# Patient Record
Sex: Male | Born: 1985 | Race: White | Hispanic: No | Marital: Single | Smoking: Current every day smoker
Health system: Southern US, Community
[De-identification: ages and names within clinical notes are randomized; demographics above are authoritative.]

## PROBLEM LIST (undated history)

## (undated) DIAGNOSIS — J020 Streptococcal pharyngitis: Secondary | ICD-10-CM

## (undated) DIAGNOSIS — G8929 Other chronic pain: Secondary | ICD-10-CM

## (undated) DIAGNOSIS — N200 Calculus of kidney: Secondary | ICD-10-CM

## (undated) DIAGNOSIS — R109 Unspecified abdominal pain: Secondary | ICD-10-CM

## (undated) HISTORY — PX: LITHOTRIPSY: SUR834

---
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
## 2008-10-26 ENCOUNTER — Emergency Department (HOSPITAL_COMMUNITY): Admission: EM | Admit: 2008-10-26 | Discharge: 2008-10-26 | Payer: Self-pay | Admitting: Emergency Medicine

## 2009-06-15 ENCOUNTER — Emergency Department (HOSPITAL_COMMUNITY)
Admission: EM | Admit: 2009-06-15 | Discharge: 2009-06-15 | Payer: Self-pay | Source: Home / Self Care | Admitting: Emergency Medicine

## 2009-07-17 ENCOUNTER — Emergency Department (HOSPITAL_COMMUNITY): Admission: EM | Admit: 2009-07-17 | Discharge: 2009-07-17 | Payer: Self-pay | Admitting: Emergency Medicine

## 2009-09-05 ENCOUNTER — Emergency Department (HOSPITAL_COMMUNITY): Admission: EM | Admit: 2009-09-05 | Discharge: 2009-09-05 | Payer: Self-pay | Admitting: Emergency Medicine

## 2009-09-15 ENCOUNTER — Emergency Department (HOSPITAL_COMMUNITY)
Admission: EM | Admit: 2009-09-15 | Discharge: 2009-09-15 | Payer: Self-pay | Source: Home / Self Care | Admitting: Emergency Medicine

## 2009-09-18 ENCOUNTER — Emergency Department (HOSPITAL_COMMUNITY): Admission: EM | Admit: 2009-09-18 | Discharge: 2009-09-18 | Payer: Self-pay | Admitting: Emergency Medicine

## 2010-04-25 LAB — URINALYSIS, ROUTINE W REFLEX MICROSCOPIC
Glucose, UA: NEGATIVE mg/dL
Nitrite: NEGATIVE
pH: 6 (ref 5.0–8.0)

## 2010-04-25 LAB — URINE MICROSCOPIC-ADD ON

## 2010-04-26 LAB — URINE MICROSCOPIC-ADD ON

## 2010-04-26 LAB — URINALYSIS, ROUTINE W REFLEX MICROSCOPIC: Glucose, UA: NEGATIVE mg/dL

## 2010-04-29 LAB — URINALYSIS, ROUTINE W REFLEX MICROSCOPIC
Bilirubin Urine: NEGATIVE
Leukocytes, UA: NEGATIVE
Specific Gravity, Urine: >1.03 — ABNORMAL HIGH (ref 1.005–1.030)
Urobilinogen, UA: 0.2 mg/dL (ref 0.0–1.0)
pH: 6 (ref 5.0–8.0)

## 2010-04-29 LAB — URINE MICROSCOPIC-ADD ON

## 2010-05-26 ENCOUNTER — Emergency Department (HOSPITAL_COMMUNITY)
Admission: EM | Admit: 2010-05-26 | Discharge: 2010-05-27 | Disposition: A | Discharge status: Home / Self Care | Payer: 59 | Attending: Emergency Medicine | Admitting: Emergency Medicine

## 2010-05-26 DIAGNOSIS — R109 Unspecified abdominal pain: Secondary | ICD-10-CM | POA: Insufficient documentation

## 2010-05-26 DIAGNOSIS — R319 Hematuria, unspecified: Secondary | ICD-10-CM | POA: Insufficient documentation

## 2010-05-26 DIAGNOSIS — F17200 Nicotine dependence, unspecified, uncomplicated: Secondary | ICD-10-CM | POA: Insufficient documentation

## 2010-05-26 DIAGNOSIS — N23 Unspecified renal colic: Secondary | ICD-10-CM | POA: Insufficient documentation

## 2010-05-26 LAB — URINALYSIS, ROUTINE W REFLEX MICROSCOPIC
Bilirubin Urine: NEGATIVE
Glucose, UA: NEGATIVE mg/dL
Ketones, ur: NEGATIVE mg/dL
Leukocytes, UA: NEGATIVE
Nitrite: NEGATIVE
Protein, ur: NEGATIVE mg/dL
Specific Gravity, Urine: >1.03 — ABNORMAL HIGH (ref 1.005–1.030)
Urobilinogen, UA: 0.2 mg/dL (ref 0.0–1.0)
pH: 6 (ref 5.0–8.0)

## 2010-05-26 LAB — URINE MICROSCOPIC-ADD ON

## 2010-07-05 ENCOUNTER — Emergency Department (HOSPITAL_COMMUNITY)
Admission: EM | Admit: 2010-07-05 | Discharge: 2010-07-05 | Disposition: A | Discharge status: Home / Self Care | Payer: 59 | Attending: Emergency Medicine | Admitting: Emergency Medicine

## 2010-07-05 DIAGNOSIS — N201 Calculus of ureter: Secondary | ICD-10-CM | POA: Insufficient documentation

## 2010-07-05 DIAGNOSIS — F17200 Nicotine dependence, unspecified, uncomplicated: Secondary | ICD-10-CM | POA: Insufficient documentation

## 2010-07-05 DIAGNOSIS — L408 Other psoriasis: Secondary | ICD-10-CM | POA: Insufficient documentation

## 2010-07-05 LAB — URINE MICROSCOPIC-ADD ON

## 2010-07-05 LAB — URINALYSIS, ROUTINE W REFLEX MICROSCOPIC
Protein, ur: 100 mg/dL — AB
Specific Gravity, Urine: >1.03 — ABNORMAL HIGH (ref 1.005–1.030)

## 2011-05-25 ENCOUNTER — Emergency Department (HOSPITAL_COMMUNITY): Payer: Self-pay

## 2011-05-25 ENCOUNTER — Emergency Department (HOSPITAL_COMMUNITY)
Admission: EM | Admit: 2011-05-25 | Discharge: 2011-05-25 | Disposition: A | Discharge status: Home / Self Care | Payer: Self-pay | Attending: Emergency Medicine | Admitting: Emergency Medicine

## 2011-05-25 ENCOUNTER — Encounter (HOSPITAL_COMMUNITY): Payer: Self-pay | Admitting: *Deleted

## 2011-05-25 DIAGNOSIS — F17200 Nicotine dependence, unspecified, uncomplicated: Secondary | ICD-10-CM | POA: Insufficient documentation

## 2011-05-25 DIAGNOSIS — R109 Unspecified abdominal pain: Secondary | ICD-10-CM | POA: Insufficient documentation

## 2011-05-25 DIAGNOSIS — Z87442 Personal history of urinary calculi: Secondary | ICD-10-CM | POA: Insufficient documentation

## 2011-05-25 LAB — URINALYSIS, ROUTINE W REFLEX MICROSCOPIC
Bilirubin Urine: NEGATIVE
Hgb urine dipstick: NEGATIVE
Specific Gravity, Urine: 1.025 (ref 1.005–1.030)

## 2011-05-25 MED ORDER — KETOROLAC TROMETHAMINE 60 MG/2ML IM SOLN
60.0000 mg | Freq: Once | INTRAMUSCULAR | Status: AC
  Administered 2011-05-25: 60 mg via INTRAMUSCULAR
  Filled 2011-05-25: qty 2

## 2011-05-25 NOTE — ED Notes (Signed)
Patient states he thinks he's passing the stone and requests not to have xray completed and to go home.

## 2011-05-25 NOTE — ED Notes (Signed)
Vomiting, diarrhea, pain rt lower back,  Hx of kidney stones.

## 2011-05-25 NOTE — ED Provider Notes (Signed)
History    This chart was scribed for Dale Quarry, MD, MD by Smitty Pluck. The patient was seen in room APA03 and the patient's care was started at 7:27PM.   CSN: [id number]  Arrival date & time 05/25/11  1614   First MD Initiated Contact with Patient 05/25/11 1914      Chief Complaint  Patient presents with  . Flank Pain    (Consider location/radiation/quality/duration/timing/severity/associated sxs/prior treatment) Patient is a 26 y.o. male presenting with flank pain. The history is provided by the patient.  Flank Pain   Dale Martinez is a 26 y.o. male who presents to the Emergency Department complaining of moderate right side flank pain onset today. Pt reports that the pain has improved. Describes the pain as a "soreness." The pain felt like previous kidney stones. He states that pain was 6/10 at worse and now at 2/10. Pt reports having nausea today and vomiting. Pt is a smoker. Denies drinking alcohol. He reports that he has had 10-11 kidney stones. There is no radiation of pain.   Past Medical History  Diagnosis Date  . Renal disorder   . Kidney stones     History reviewed. No pertinent past surgical history.  Family History  Problem Relation Age of Onset  . Diabetes Father     History  Substance Use Topics  . Smoking status: Current Everyday Smoker  . Smokeless tobacco: Not on file  . Alcohol Use: No      Review of Systems  Genitourinary: Positive for flank pain.  All other systems reviewed and are negative.   10 Systems reviewed and all are negative for acute change except as noted in the HPI.   Allergies  Review of patient's allergies indicates no known allergies.  Home Medications  No current outpatient prescriptions on file.  BP 143/74  Pulse 112  Temp(Src) 98 F (36.7 C) (Oral)  Resp 20  Ht 5\' 11"  (1.803 m)  Wt 215 lb (97.523 kg)  BMI 29.99 kg/m2  SpO2 98%  Physical Exam  Nursing note and vitals reviewed. Constitutional: He is  oriented to person, place, and time. He appears well-developed and well-nourished. No distress.  HENT:  Head: Normocephalic and atraumatic.  Eyes: Conjunctivae are normal. Pupils are equal, round, and reactive to light.  Neck: Normal range of motion. Neck supple.  Cardiovascular: Normal rate, regular rhythm and normal heart sounds.   Pulmonary/Chest: Effort normal and breath sounds normal. No respiratory distress.  Abdominal: Soft. He exhibits no distension.  Neurological: He is alert and oriented to person, place, and time.  Skin: Skin is warm and dry.  Psychiatric: He has a normal mood and affect. His behavior is normal.    ED Course  Procedures (including critical care time) DIAGNOSTIC STUDIES: Oxygen Saturation is 98% on room air, normal by my interpretation.    COORDINATION OF CARE: 7:30PM EDP discusses pt ED treatment course with pt.  7:45PM EDP ordered medication: Toradol 60 mg   Labs Reviewed  URINALYSIS, ROUTINE W REFLEX MICROSCOPIC - Abnormal; Notable for the following:    Ketones, ur TRACE (*)    All other components within normal limits   No results found.   No diagnosis found.   Results for orders placed during the hospital encounter of 05/25/11  URINALYSIS, ROUTINE W REFLEX MICROSCOPIC      Component Value Range   Color, Urine YELLOW  YELLOW    APPearance CLEAR  CLEAR    Specific Gravity, Urine 1.025  1.005 - 1.030    pH 6.0  5.0 - 8.0    Glucose, UA NEGATIVE  NEGATIVE (mg/dL)   Hgb urine dipstick NEGATIVE  NEGATIVE    Bilirubin Urine NEGATIVE  NEGATIVE    Ketones, ur TRACE (*) NEGATIVE (mg/dL)   Protein, ur NEGATIVE  NEGATIVE (mg/dL)   Urobilinogen, UA 0.2  0.0 - 1.0 (mg/dL)   Nitrite NEGATIVE  NEGATIVE    Leukocytes, UA NEGATIVE  NEGATIVE     MDM  Patient states he does not want to have KUB. He feels like he is currently passing a kidney stone. He has not had any white blood cells 4 hemoglobin his urine. He is discharged to home to followup as  needed.      Dale Quarry, MD 05/25/11 2115

## 2011-05-25 NOTE — Discharge Instructions (Signed)
Flank Pain Flank pain refers to pain that is located on the side of the body between the upper abdomen and the back. It can be caused by many things. CAUSES  Some of the more common causes of flank pain include:  Muscle strain.   Muscle spasms.   A disease of your spine (vertebral disk disease).   A lung infection (pneumonia).   Fluid around your lungs (pulmonary edema).   A kidney infection.   Kidney stones.   A very painful skin rash on only one side of your body (shingles).   Gallbladder disease.  DIAGNOSIS  Blood tests, urine tests, and X-rays may help your caregiver determine what is wrong. TREATMENT  The treatment of pain depends on the cause. Your caregiver will determine what treatment will work best for you. HOME CARE INSTRUCTIONS   Home care will depend on the cause of your pain.   Some medications may help relieve the pain. Take medication for relief of pain as directed by your caregiver.   Tell your caregiver about any changes in your pain.   Follow up with your caregiver.  SEEK IMMEDIATE MEDICAL CARE IF:   Your pain is not controlled with medication.   The pain increases.   You have abdominal pain.   You have shortness of breath.   You have persistent nausea or vomiting.   You have swelling in your abdomen.   You feel faint or pass out.   You have a temperature by mouth above 102 F (38.9 C), not controlled by medicine.  MAKE SURE YOU:   Understand these instructions.   Will watch your condition.   Will get help right away if you are not doing well or get worse.  Document Released: 03/19/2005 Document Revised: 01/15/2011 Document Reviewed: 07/13/2009 [employer] Patient Information 2012 [employer]. 

## 2011-08-01 ENCOUNTER — Encounter (HOSPITAL_COMMUNITY): Payer: Self-pay

## 2011-08-01 ENCOUNTER — Emergency Department (HOSPITAL_COMMUNITY)
Admission: EM | Admit: 2011-08-01 | Discharge: 2011-08-01 | Disposition: A | Discharge status: Home / Self Care | Payer: BC Managed Care – PPO | Attending: Emergency Medicine | Admitting: Emergency Medicine

## 2011-08-01 DIAGNOSIS — J029 Acute pharyngitis, unspecified: Secondary | ICD-10-CM | POA: Insufficient documentation

## 2011-08-01 DIAGNOSIS — F17200 Nicotine dependence, unspecified, uncomplicated: Secondary | ICD-10-CM | POA: Insufficient documentation

## 2011-08-01 MED ORDER — PENICILLIN V POTASSIUM 250 MG PO TABS
500.0000 mg | ORAL_TABLET | Freq: Once | ORAL | Status: AC
  Administered 2011-08-01: 500 mg via ORAL
  Filled 2011-08-01: qty 2

## 2011-08-01 MED ORDER — PENICILLIN V POTASSIUM 500 MG PO TABS: 500.0000 mg | ORAL_TABLET | Freq: Four times a day (QID) | ORAL | Status: AC

## 2011-08-01 NOTE — ED Notes (Signed)
C/o sore throat since yesterday, tonsils are swollen and red, strep screen obtained and sent to lab

## 2011-08-01 NOTE — ED Provider Notes (Signed)
History     CSN: [id number]  Arrival date & time 08/01/11  1025   First MD Initiated Contact with Patient 08/01/11 1054      Chief Complaint  Patient presents with  . Sore Throat    (Consider location/radiation/quality/duration/timing/severity/associated sxs/prior treatment) Patient is a 26 y.o. male presenting with pharyngitis. The history is provided by the patient. No language interpreter was used.  Sore Throat This is a new problem. The current episode started yesterday. The problem occurs constantly. The problem has been gradually worsening. Associated symptoms include a fever, headaches and a sore throat. Pertinent negatives include no chills or rash. The symptoms are aggravated by swallowing. He has tried nothing for the symptoms.    Past Medical History  Diagnosis Date  . Renal disorder   . Kidney stones     History reviewed. No pertinent past surgical history.  Family History  Problem Relation Age of Onset  . Diabetes Father     History  Substance Use Topics  . Smoking status: Current Everyday Smoker  . Smokeless tobacco: Not on file  . Alcohol Use: No      Review of Systems  Constitutional: Positive for fever. Negative for chills.  HENT: Positive for sore throat and trouble swallowing.   Skin: Negative for rash.  Neurological: Positive for headaches.  All other systems reviewed and are negative.    Allergies  Review of patient's allergies indicates no known allergies.  Home Medications   Current Outpatient Rx  Name Route Sig Dispense Refill  . PENICILLIN V POTASSIUM 500 MG PO TABS Oral Take 1 tablet (500 mg total) by mouth 4 (four) times daily. 40 tablet 0    BP 148/83  Pulse 74  Temp 97.8 F (36.6 C) (Oral)  Resp 20  Ht 5\' 8"  (1.727 m)  Wt 220 lb (99.791 kg)  BMI 33.45 kg/m2  SpO2 98%  Physical Exam  Nursing note and vitals reviewed. Constitutional: He is oriented to person, place, and time. He appears well-developed and  well-nourished.  HENT:  Head: Normocephalic and atraumatic.  Right Ear: External ear normal.  Left Ear: External ear normal.  Mouth/Throat: Uvula is midline and mucous membranes are normal. No uvula swelling. Oropharyngeal exudate and posterior oropharyngeal erythema present. No posterior oropharyngeal edema or tonsillar abscesses.  Eyes: EOM are normal.  Neck: Trachea normal and normal range of motion.  Cardiovascular: Normal rate, regular rhythm, normal heart sounds and intact distal pulses.   Pulmonary/Chest: Effort normal and breath sounds normal. No respiratory distress.  Abdominal: Soft. He exhibits no distension. There is no tenderness.  Musculoskeletal: Normal range of motion.  Lymphadenopathy:    He has cervical adenopathy.       Right cervical: Deep cervical adenopathy present.       Left cervical: Deep cervical adenopathy present.  Neurological: He is alert and oriented to person, place, and time.  Skin: Skin is warm and dry.  Psychiatric: He has a normal mood and affect. Judgment normal.    ED Course  Procedures (including critical care time)   Labs Reviewed  RAPID STREP SCREEN   No results found.   1. Pharyngitis       MDM  rx pen VK 500 mg, [address] [place], [place] 08/01/11 1205

## 2011-08-01 NOTE — Discharge Instructions (Signed)
Pharyngitis, Viral and Bacterial Pharyngitis is soreness (inflammation) or infection of the pharynx. It is also called a sore throat. CAUSES  Most sore throats are caused by viruses and are part of a cold. However, some sore throats are caused by strep and other bacteria. Sore throats can also be caused by post nasal drip from draining sinuses, allergies and sometimes from sleeping with an open mouth. Infectious sore throats can be spread from person to person by coughing, sneezing and sharing cups or eating utensils. TREATMENT  Sore throats that are viral usually last 3-4 days. Viral illness will get better without medications (antibiotics). Strep throat and other bacterial infections will usually begin to get better about 24-48 hours after you begin to take antibiotics. HOME CARE INSTRUCTIONS   If the caregiver feels there is a bacterial infection or if there is a positive strep test, they will prescribe an antibiotic. The full course of antibiotics must be taken. If the full course of antibiotic is not taken, you or your child may become ill again. If you or your child has strep throat and do not finish all of the medication, serious heart or kidney diseases may develop.   Drink enough water and fluids to keep your urine clear or pale yellow.   Only take over-the-counter or prescription medicines for pain, discomfort or fever as directed by your caregiver.   Get lots of rest.   Gargle with salt water ( tsp. of salt in a glass of water) as often as every 1-2 hours as you need for comfort.   Hard candies may soothe the throat if individual is not at risk for choking. Throat sprays or lozenges may also be used.  SEEK MEDICAL CARE IF:   Large, tender lumps in the neck develop.   A rash develops.   Green, yellow-brown or bloody sputum is coughed up.   Your baby is older than 3 months with a rectal temperature of 100.5 F (38.1 C) or higher for more than 1 day.  SEEK IMMEDIATE MEDICAL CARE  IF:   A stiff neck develops.   You or your child are drooling or unable to swallow liquids.   You or your child are vomiting, unable to keep medications or liquids down.   You or your child has severe pain, unrelieved with recommended medications.   You or your child are having difficulty breathing (not due to stuffy nose).   You or your child are unable to fully open your mouth.   You or your child develop redness, swelling, or severe pain anywhere on the neck.   You have a fever.   Your baby is older than 3 months with a rectal temperature of 102 F (38.9 C) or higher.   Your baby is 61 months old or younger with a rectal temperature of 100.4 F (38 C) or higher.  MAKE SURE YOU:   Understand these instructions.   Will watch your condition.   Will get help right away if you are not doing well or get worse.  Document Released: 01/26/2005 Document Revised: 01/15/2011 Document Reviewed: 04/25/2007 [facility] Patient Information 2012 [place], [place].Salt Water Gargle This solution will help make your mouth and throat feel better. HOME CARE INSTRUCTIONS   Mix 1 teaspoon of salt in 8 ounces of warm water.   Gargle with this solution as much or often as you need or as directed. Swish and gargle gently if you have any sores or wounds in your mouth.   Do not  swallow this mixture.  Document Released: 10/31/2003 Document Revised: 01/15/2011 Document Reviewed: 03/23/2008 [facility] Patient Information 2012 [place], [place].   Take the antibiotic as directed.  Take ibuprofen 800 mg every 8 hrs with food.  Gargle frequently with salt water.  Chloraseptic will help also.

## 2011-08-01 NOTE — ED Notes (Signed)
Complain of sore throat since yesterday.

## 2011-08-04 NOTE — ED Provider Notes (Signed)
Medical screening examination/treatment/procedure(s) were performed by non-physician practitioner and as supervising physician I was immediately available for consultation/collaboration.   Dale Martinez W. Konstantinos Cordoba, MD 08/04/11 1058 

## 2011-09-29 ENCOUNTER — Encounter (HOSPITAL_COMMUNITY): Payer: Self-pay | Admitting: Emergency Medicine

## 2011-09-29 ENCOUNTER — Emergency Department (HOSPITAL_COMMUNITY)
Admission: EM | Admit: 2011-09-29 | Discharge: 2011-09-29 | Disposition: A | Discharge status: Home / Self Care | Payer: Self-pay | Attending: Emergency Medicine | Admitting: Emergency Medicine

## 2011-09-29 ENCOUNTER — Emergency Department (HOSPITAL_COMMUNITY): Payer: Self-pay

## 2011-09-29 DIAGNOSIS — R109 Unspecified abdominal pain: Secondary | ICD-10-CM | POA: Insufficient documentation

## 2011-09-29 DIAGNOSIS — N201 Calculus of ureter: Secondary | ICD-10-CM | POA: Insufficient documentation

## 2011-09-29 LAB — CBC WITH DIFFERENTIAL/PLATELET
Basophils Absolute: 0.1 10*3/uL (ref 0.0–0.1)
Eosinophils Absolute: 0.4 10*3/uL (ref 0.0–0.7)
Eosinophils Relative: 4 % (ref 0–5)
Lymphs Abs: 2.7 10*3/uL (ref 0.7–4.0)
MCH: 30.6 pg (ref 26.0–34.0)
MCHC: 33.9 g/dL (ref 30.0–36.0)
MCV: 90.4 fL (ref 78.0–100.0)
Monocytes Absolute: 1.2 10*3/uL — ABNORMAL HIGH (ref 0.1–1.0)
Monocytes Relative: 10 % (ref 3–12)
Neutro Abs: 7.8 10*3/uL — ABNORMAL HIGH (ref 1.7–7.7)
Neutrophils Relative %: 64 % (ref 43–77)
RDW: 12.9 % (ref 11.5–15.5)
WBC: 12.2 10*3/uL — ABNORMAL HIGH (ref 4.0–10.5)

## 2011-09-29 LAB — BASIC METABOLIC PANEL
CO2: 24 mEq/L (ref 19–32)
Calcium: 9.5 mg/dL (ref 8.4–10.5)
Chloride: 105 mEq/L (ref 96–112)
Creatinine, Ser: 0.86 mg/dL (ref 0.50–1.35)
Glucose, Bld: 113 mg/dL — ABNORMAL HIGH (ref 70–99)
Sodium: 139 mEq/L (ref 135–145)

## 2011-09-29 LAB — URINALYSIS, ROUTINE W REFLEX MICROSCOPIC
Leukocytes, UA: NEGATIVE
Protein, ur: NEGATIVE mg/dL

## 2011-09-29 MED ORDER — IBUPROFEN 800 MG PO TABS: 800.0000 mg | ORAL_TABLET | Freq: Three times a day (TID) | ORAL | Status: AC

## 2011-09-29 MED ORDER — TAMSULOSIN HCL 0.4 MG PO CAPS: 0.4000 mg | ORAL_CAPSULE | Freq: Every day | ORAL | Status: DC

## 2011-09-29 MED ORDER — ONDANSETRON HCL 4 MG PO TABS: 4.0000 mg | ORAL_TABLET | Freq: Four times a day (QID) | ORAL | Status: AC

## 2011-09-29 MED ORDER — KETOROLAC TROMETHAMINE 30 MG/ML IJ SOLN
30.0000 mg | Freq: Once | INTRAMUSCULAR | Status: AC
  Administered 2011-09-29: 30 mg via INTRAVENOUS
  Filled 2011-09-29: qty 1

## 2011-09-29 MED ORDER — ONDANSETRON HCL 4 MG/2ML IJ SOLN
4.0000 mg | Freq: Once | INTRAMUSCULAR | Status: AC
  Administered 2011-09-29: 4 mg via INTRAVENOUS
  Filled 2011-09-29: qty 2

## 2011-09-29 MED ORDER — SODIUM CHLORIDE 0.9 % IV BOLUS (SEPSIS)
1000.0000 mL | Freq: Once | INTRAVENOUS | Status: AC
  Administered 2011-09-29: 1000 mL via INTRAVENOUS

## 2011-09-29 MED ORDER — OXYCODONE-ACETAMINOPHEN 5-325 MG PO TABS: 2.0000 | ORAL_TABLET | ORAL | Status: AC | PRN

## 2011-09-29 MED ORDER — MORPHINE SULFATE 4 MG/ML IJ SOLN
4.0000 mg | Freq: Once | INTRAMUSCULAR | Status: AC
  Administered 2011-09-29: 4 mg via INTRAVENOUS
  Filled 2011-09-29: qty 1

## 2011-09-29 NOTE — ED Notes (Signed)
Pt c/o left flank pain with nausea since yesterday. Pt states he has a hx of kidney stones.

## 2011-09-29 NOTE — ED Provider Notes (Signed)
History   This chart was scribed for Dale Octave, MD by Charolett Bumpers . The patient was seen in room APA03/APA03. Patient's care was started at 0705.    CSN: [id number]  Arrival date & time 09/29/11  3086   First MD Initiated Contact with Patient 09/29/11 [phone]      Chief Complaint  Patient presents with  . Flank Pain  . Nausea    (Consider location/radiation/quality/duration/timing/severity/associated sxs/prior treatment) HPI DALE RIBEIRO is a 26 y.o. male who has a h/o kidney stones presents to the Emergency Department complaining of constant, moderate left flank pain that radiates around to his lower abdomen since yesterday. Pt reports associated n/v/d this morning, and states that he has vomited 2-3 times. Pt also notes that he noticed some hematuria. Pt denies any fevers or dysuria. Pt states that he hasn't taken anything for his symptoms. Pt denies any radiation of pain into his lower extremities. Pt reports that his last kidney stone was 6 months ago and states that today's symptoms are similar. Pt denies any h/o abdominal surgeries or surgeries for kidney stones. Pt denies ever seeing a nephrologist but reports he has been told to consult one for his frequent kidney stones.   No PCP   Past Medical History  Diagnosis Date  . Renal disorder   . Kidney stones     History reviewed. No pertinent past surgical history.  Family History  Problem Relation Age of Onset  . Diabetes Father     History  Substance Use Topics  . Smoking status: Current Everyday Smoker    Types: Cigarettes  . Smokeless tobacco: Not on file  . Alcohol Use: No      Review of Systems A complete 10 system review of systems was obtained and all systems are negative except as noted in the HPI and PMH.   Allergies  Review of patient's allergies indicates no known allergies.  Home Medications  No current outpatient prescriptions on file.  BP 151/92  Pulse 86  Temp 97.4 F  (36.3 C) (Oral)  Resp 20  Ht 5\' 11"  (1.803 m)  Wt 230 lb (104.327 kg)  BMI 32.08 kg/m2  SpO2 99%  Physical Exam  Nursing note and vitals reviewed. Constitutional: He is oriented to person, place, and time. He appears well-developed and well-nourished. No distress.  HENT:  Head: Normocephalic and atraumatic.  Right Ear: External ear normal.  Left Ear: External ear normal.  Nose: Nose normal.  Mouth/Throat: Oropharynx is clear and moist.  Eyes: Conjunctivae and EOM are normal. Pupils are equal, round, and reactive to light.  Neck: Normal range of motion. Neck supple. No tracheal deviation present.  Cardiovascular: Normal rate, regular rhythm and normal heart sounds.   Pulmonary/Chest: Effort normal and breath sounds normal. No respiratory distress. He has no wheezes.  Abdominal: Soft. Bowel sounds are normal. He exhibits no distension. There is no tenderness.  Genitourinary: Testes normal.       Testicles normal bilaterally.   Musculoskeletal: Normal range of motion. He exhibits tenderness. He exhibits no edema.       No CVA tenderness. Lower left perispinal tenderness.   Neurological: He is alert and oriented to person, place, and time. No sensory deficit.  Skin: Skin is warm and dry.  Psychiatric: He has a normal mood and affect. His behavior is normal.    ED Course  Procedures (including critical care time)  DIAGNOSTIC STUDIES: Oxygen Saturation is 99% on room air, normal by my interpretation.  COORDINATION OF CARE:  07:12-Discussed planned course of treatment with the patient including IV fluids, pain medication, CT scan of abdomen, blood work and UA, who is agreeable at this time.   07:15-Medication Orders: Ketorolac (Toradol) 30 mg/mL injection 30 mg-once; Morphine 4 mg/mL injection 4 mg-once; Ondansetron (Zofran) injection 4 mg-once; Sodium chloride 0.9% bolus 1,000 mL-once  09:07-Recheck: Pt reports improvement with ED medications. Informed pt of imaging findings of  a kidney stone. Awaiting pt to urinate for UA.   10:05-Recheck: Informed pt of 4mm stone on left and lab results. Pt be d/c with pain and nausea medication and to f/u with Dr. Jerre Simon in nephrology. Pt is agreeable.   Labs Reviewed  URINALYSIS, ROUTINE W REFLEX MICROSCOPIC - Abnormal; Notable for the following:    Hgb urine dipstick LARGE (*)     All other components within normal limits  CBC WITH DIFFERENTIAL - Abnormal; Notable for the following:    WBC 12.2 (*)     Neutro Abs 7.8 (*)     Monocytes Absolute 1.2 (*)     All other components within normal limits  BASIC METABOLIC PANEL - Abnormal; Notable for the following:    Glucose, Bld 113 (*)     All other components within normal limits  URINE MICROSCOPIC-ADD ON   Ct Abdomen Pelvis Wo Contrast  09/29/2011  *RADIOLOGY REPORT*  Clinical Data:  Left-sided flank pain with hematuria for 1 day.  CT ABDOMEN AND PELVIS WITHOUT CONTRAST (CT UROGRAM)  Technique: Contiguous axial images of the abdomen and pelvis without oral or intravenous contrast were obtained.  Comparison: 09/15/2009.  Findings:  Exam is limited for evaluation of entities other than urinary tract calculi due to lack of oral or intravenous contrast.   Clear lung bases.  Normal heart size without pericardial or pleural effusion.  Mild hepatic steatosis with sparing adjacent the gallbladder. Hepatomegaly, 19.2 cm cranial caudal.  Normal spleen, stomach, pancreas, gallbladder, biliary tract, adrenal glands.  Punctate bilateral renal collecting system calculi.  Mild left-sided urinary tract obstruction secondary to a ureteral pelvic junction calculus which measures 4 mm on transverse image 44.  No right-sided ureteric calculi or urinary tract obstruction.  Stable mildly prominent retroperitoneal nodes, reactive.  Normal colon, appendix, and terminal ileum.  Stable mildly prominent ileocolic mesenteric nodes.  No pelvic sidewall adenopathy. Normal urinary bladder and prostate. No  significant free fluid.  An area of skin thickening about the left pelvic anterior wall on image 85 is similar and nonspecific. Bone islands within the femoral heads.  Disc bulges at L4-L5 and L5- S1.  IMPRESSION:  1.  Mild left-sided urinary tract obstruction secondary to 4 mm left ureteral pelvic junction calculus. 2.  Bilateral renal calculi. 3.  Hepatic steatosis and hepatomegaly. 4.  Focal skin thickening about the anterior pelvic wall.  Similar. Consider physical exam correlation.   Original Report Authenticated By: Consuello Bossier, M.D.      No diagnosis found.    MDM  Left flank pain with nausea since yesterday. History of kidney stones. Abdomen soft and nontender.  UA with blood, no infection.  4mm obstructing stone on L.   Results d/w patient.  Pain controlled. No vomiting in ED. Stable for outpatient followup.   I personally performed the services described in this documentation, which was scribed in my presence.  The recorded information has been reviewed and considered.      Dale Octave, MD 09/29/11 1024

## 2011-10-21 ENCOUNTER — Encounter (HOSPITAL_COMMUNITY): Payer: Self-pay

## 2011-10-21 ENCOUNTER — Emergency Department (HOSPITAL_COMMUNITY)
Admission: EM | Admit: 2011-10-21 | Discharge: 2011-10-21 | Disposition: A | Discharge status: Home / Self Care | Payer: Self-pay | Attending: Emergency Medicine | Admitting: Emergency Medicine

## 2011-10-21 ENCOUNTER — Emergency Department (HOSPITAL_COMMUNITY): Payer: Self-pay

## 2011-10-21 DIAGNOSIS — R109 Unspecified abdominal pain: Secondary | ICD-10-CM | POA: Insufficient documentation

## 2011-10-21 DIAGNOSIS — N200 Calculus of kidney: Secondary | ICD-10-CM | POA: Insufficient documentation

## 2011-10-21 DIAGNOSIS — F17200 Nicotine dependence, unspecified, uncomplicated: Secondary | ICD-10-CM | POA: Insufficient documentation

## 2011-10-21 LAB — URINALYSIS, ROUTINE W REFLEX MICROSCOPIC
Bilirubin Urine: NEGATIVE
Glucose, UA: NEGATIVE mg/dL
Hgb urine dipstick: NEGATIVE
Ketones, ur: NEGATIVE mg/dL
Protein, ur: NEGATIVE mg/dL
Urobilinogen, UA: 0.2 mg/dL (ref 0.0–1.0)

## 2011-10-21 MED ORDER — ONDANSETRON HCL 4 MG/2ML IJ SOLN
4.0000 mg | Freq: Once | INTRAMUSCULAR | Status: AC
  Administered 2011-10-21: 4 mg via INTRAVENOUS
  Filled 2011-10-21: qty 2

## 2011-10-21 MED ORDER — HYDROMORPHONE HCL PF 1 MG/ML IJ SOLN
0.5000 mg | Freq: Once | INTRAMUSCULAR | Status: AC
  Administered 2011-10-21: 0.5 mg via INTRAVENOUS
  Filled 2011-10-21: qty 1

## 2011-10-21 MED ORDER — SODIUM CHLORIDE 0.9 % IV BOLUS (SEPSIS)
500.0000 mL | Freq: Once | INTRAVENOUS | Status: AC
  Administered 2011-10-21: 1000 mL via INTRAVENOUS

## 2011-10-21 MED ORDER — TAMSULOSIN HCL 0.4 MG PO CAPS: 0.4000 mg | ORAL_CAPSULE | Freq: Every day | ORAL | Status: DC

## 2011-10-21 MED ORDER — KETOROLAC TROMETHAMINE 30 MG/ML IJ SOLN
30.0000 mg | Freq: Once | INTRAMUSCULAR | Status: AC
  Administered 2011-10-21: 30 mg via INTRAVENOUS
  Filled 2011-10-21: qty 1

## 2011-10-21 MED ORDER — OXYCODONE-ACETAMINOPHEN 5-325 MG PO TABS: 1.0000 | ORAL_TABLET | Freq: Four times a day (QID) | ORAL | Status: AC | PRN

## 2011-10-21 NOTE — ED Notes (Signed)
Patient states he does not need anything at this time. 

## 2011-10-21 NOTE — ED Notes (Signed)
Pt c/o flank pain and urethra pain, pt states that he was diagnosed with kidney stone a few weeks ago, felt like he was passing a stone and now it feels like the stone has become stuck in his urethra, pt able to urinate, c/o pain and pressure.

## 2011-10-21 NOTE — ED Provider Notes (Signed)
History  This chart was scribed for Dale Hutching, MD by Shari Heritage. The patient was seen in room APA18/APA18. Patient's care was started at [phone].     CSN: [id number]  Arrival date & time 10/21/11  1191   First MD Initiated Contact with Patient 10/21/11 [phone]      Chief Complaint  Patient presents with  . Nephrolithiasis    The history is provided by the patient and medical records. No language interpreter was used.    EFRAM ROATH is a 26 y.o. male who presents to the Emergency Department complaining of moderate, constant, left flank pain that radiates around to his penis onset several hours ago. Patient says that he is urinating normally. He denies hematuria or vomiting. Patient states that he thinks the kidney stone is now lodged in his urethra. Patient was seen here on 09/29/11 by Dr. Glynn Octave complaining of left flank pain and was diagnosed with a 4 mm obstructing stone in left ureter as seen on patient's abdominal CT scans. Patient was discharged in stable condition after treatment with pain medication, anti-emetics and IV fluids.  Patient states that he has had at least 10 kidney stones in the past. He has been referred to a local urologist, but hasn't set up an appointment. He is a current everyday smoker.     Past Medical History  Diagnosis Date  . Renal disorder   . Kidney stones     History reviewed. No pertinent past surgical history.  Family History  Problem Relation Age of Onset  . Diabetes Father     History  Substance Use Topics  . Smoking status: Current Every Day Smoker    Types: Cigarettes  . Smokeless tobacco: Not on file  . Alcohol Use: No      Review of Systems A complete 10 system review of systems was obtained and all systems are negative except as noted in the HPI and PMH.   Allergies  Review of patient's allergies indicates no known allergies.  Home Medications  No current outpatient prescriptions on file.  BP 143/81  Pulse 81   Temp 98.1 F (36.7 C) (Oral)  Resp 18  Ht 5\' 11"  (1.803 m)  Wt 215 lb (97.523 kg)  BMI 29.99 kg/m2  SpO2 97%  Physical Exam  Nursing note and vitals reviewed. Constitutional: He is oriented to person, place, and time. He appears well-developed and well-nourished.  HENT:  Head: Normocephalic and atraumatic.  Eyes: Conjunctivae normal and EOM are normal. Pupils are equal, round, and reactive to light.  Neck: Normal range of motion. Neck supple.  Cardiovascular: Normal rate, regular rhythm and normal heart sounds.   Pulmonary/Chest: Effort normal and breath sounds normal.  Abdominal: Soft. Bowel sounds are normal. There is CVA tenderness.       Minima left flank tenderness.  Genitourinary: Testes normal and penis normal.  Musculoskeletal: Normal range of motion.  Neurological: He is alert and oriented to person, place, and time.  Skin: Skin is warm and dry.  Psychiatric: He has a normal mood and affect.    ED Course  Procedures (including critical care time) DIAGNOSTIC STUDIES: Oxygen Saturation is 97% on room air, adequate by my interpretation.    COORDINATION OF CARE: 10:05am- Patient informed of current plan for treatment and evaluation and agrees with plan at this time. Will order urinalysis, abdominal X-ray, IV fluids, Zofran 4 mg, Toradol 30 mg and Dilaudid 0.5 mg.  Results for orders placed during the hospital encounter of  10/21/11  URINALYSIS, ROUTINE W REFLEX MICROSCOPIC      Component Value Range   Color, Urine YELLOW  YELLOW   APPearance CLEAR  CLEAR   Specific Gravity, Urine 1.025  1.005 - 1.030   pH 6.0  5.0 - 8.0   Glucose, UA NEGATIVE  NEGATIVE mg/dL   Hgb urine dipstick NEGATIVE  NEGATIVE   Bilirubin Urine NEGATIVE  NEGATIVE   Ketones, ur NEGATIVE  NEGATIVE mg/dL   Protein, ur NEGATIVE  NEGATIVE mg/dL   Urobilinogen, UA 0.2  0.0 - 1.0 mg/dL   Nitrite NEGATIVE  NEGATIVE   Leukocytes, UA NEGATIVE  NEGATIVE    Dg Abd 1 View  10/21/2011  *RADIOLOGY  REPORT*  Clinical Data: Nephrolithiasis.  ABDOMEN - 1 VIEW  Comparison: CT of abdomen and pelvis 09/29/2011.  Findings: Possible 2 mm nonobstructive calculus projecting over the upper pole of the left kidney.  Probable 1-2 mm calculus projecting over the lower pole of the right kidney.  No definite calculus identified in the expected location of the left UPJ to suggest persistent calculus in this region.  In the pelvis, there is a 3 mm calcification on the left, near the expected location of the left ureterovesicular junction; however, this is unchanged compared to prior study 09/18/2009, favored to represent a small phlebolith (there is a phlebolith in this region on image 87 of series 2 of CT scan 09/29/2011).  Gas and stool are noted throughout the colon extending to the level of the distal rectum.  No pathologic distension of small bowel.  No gross evidence of pneumoperitoneum.  IMPRESSION: 1.  No definite left ureteral stone is identified. 2.  Nonobstructive calculi in the collecting systems of the kidneys bilaterally. 3.  Small left pelvic phlebolith is unchanged.   Original Report Authenticated By: Florencia Reasons, M.D.      No diagnosis found.    MDM  Patient appears in no acute distress.  Urinalysis normal. KUB shows no obvious stone in ureter or bladder. Discharge home with Flomax and pain meds. Refer to urology.     I personally performed the services described in this documentation, which was scribed in my presence. The recorded information has been reviewed and considered.    Dale Hutching, MD 10/21/11 [phone]

## 2011-10-21 NOTE — ED Notes (Signed)
Pt returned from xray, states that the pain is better, update given on plan of care.

## 2011-10-21 NOTE — ED Notes (Signed)
Pt reports was diagnosed with kidney stone a couple of weeks ago.  Pt says feels like kidney stone is stuck in urethra.  Pt says has been able to void without difficulty.

## 2011-12-14 ENCOUNTER — Emergency Department (HOSPITAL_COMMUNITY): Payer: Self-pay

## 2011-12-14 ENCOUNTER — Encounter (HOSPITAL_COMMUNITY): Payer: Self-pay | Admitting: Emergency Medicine

## 2011-12-14 ENCOUNTER — Emergency Department (HOSPITAL_COMMUNITY)
Admission: EM | Admit: 2011-12-14 | Discharge: 2011-12-14 | Disposition: A | Discharge status: Home / Self Care | Payer: Self-pay | Attending: Emergency Medicine | Admitting: Emergency Medicine

## 2011-12-14 DIAGNOSIS — R109 Unspecified abdominal pain: Secondary | ICD-10-CM | POA: Insufficient documentation

## 2011-12-14 DIAGNOSIS — Z79899 Other long term (current) drug therapy: Secondary | ICD-10-CM | POA: Insufficient documentation

## 2011-12-14 DIAGNOSIS — Z87448 Personal history of other diseases of urinary system: Secondary | ICD-10-CM | POA: Insufficient documentation

## 2011-12-14 DIAGNOSIS — M549 Dorsalgia, unspecified: Secondary | ICD-10-CM | POA: Insufficient documentation

## 2011-12-14 DIAGNOSIS — Z87442 Personal history of urinary calculi: Secondary | ICD-10-CM | POA: Insufficient documentation

## 2011-12-14 DIAGNOSIS — R110 Nausea: Secondary | ICD-10-CM | POA: Insufficient documentation

## 2011-12-14 DIAGNOSIS — F17200 Nicotine dependence, unspecified, uncomplicated: Secondary | ICD-10-CM | POA: Insufficient documentation

## 2011-12-14 LAB — URINALYSIS, ROUTINE W REFLEX MICROSCOPIC
Glucose, UA: NEGATIVE mg/dL
Hgb urine dipstick: NEGATIVE
Ketones, ur: NEGATIVE mg/dL
Leukocytes, UA: NEGATIVE
Protein, ur: NEGATIVE mg/dL
pH: 6 (ref 5.0–8.0)

## 2011-12-14 LAB — BASIC METABOLIC PANEL
BUN: 10 mg/dL (ref 6–23)
CO2: 26 mEq/L (ref 19–32)
Calcium: 9.7 mg/dL (ref 8.4–10.5)
Chloride: 101 mEq/L (ref 96–112)
Creatinine, Ser: 0.79 mg/dL (ref 0.50–1.35)

## 2011-12-14 LAB — CBC WITH DIFFERENTIAL/PLATELET
Eosinophils Relative: 3 % (ref 0–5)
HCT: 47.4 % (ref 39.0–52.0)
Hemoglobin: 16.4 g/dL (ref 13.0–17.0)
Lymphocytes Relative: 20 % (ref 12–46)
MCHC: 34.6 g/dL (ref 30.0–36.0)
MCV: 89.8 fL (ref 78.0–100.0)
Monocytes Absolute: 1 10*3/uL (ref 0.1–1.0)
Monocytes Relative: 7 % (ref 3–12)
Neutro Abs: 10.6 10*3/uL — ABNORMAL HIGH (ref 1.7–7.7)
WBC: 15.1 10*3/uL — ABNORMAL HIGH (ref 4.0–10.5)

## 2011-12-14 MED ORDER — NAPROXEN 500 MG PO TABS: 500.0000 mg | ORAL_TABLET | Freq: Two times a day (BID) | ORAL | Status: DC

## 2011-12-14 MED ORDER — IBUPROFEN 800 MG PO TABS
800.0000 mg | ORAL_TABLET | Freq: Once | ORAL | Status: AC
  Administered 2011-12-14: 800 mg via ORAL
  Filled 2011-12-14: qty 1

## 2011-12-14 MED ORDER — PROMETHAZINE HCL 25 MG PO TABS: 25.0000 mg | ORAL_TABLET | Freq: Four times a day (QID) | ORAL | Status: DC | PRN

## 2011-12-14 MED ORDER — HYDROCODONE-ACETAMINOPHEN 5-325 MG PO TABS: 1.0000 | ORAL_TABLET | Freq: Four times a day (QID) | ORAL | Status: AC | PRN

## 2011-12-14 NOTE — ED Provider Notes (Signed)
History   This chart was scribed for Shelda Jakes, MD, by Frederik Pear. The patient was seen in room APA09/APA09 and the patient's care was started at 1925.    CSN: [id number]  Arrival date & time 12/14/11  1857   First MD Initiated Contact with Patient 12/14/11 1925      Chief Complaint  Patient presents with  . Flank Pain    (Consider location/radiation/quality/duration/timing/severity/associated sxs/prior treatment) HPI Comments: Dale Martinez is a 26 y.o. male who presents to the Emergency Department complaining of constant, moderate left-sided flank pain that radiates to the back and to the groin and began at 8 am. He reports associated nausea. He denies any vomiting, fever, hematuria, congestion, sore throat, leg swelling, rash, headaches, chest pain, SOB, cough, or diarrhea.   No PCP.   Past Medical History  Diagnosis Date  . Renal disorder   . Kidney stones     History reviewed. No pertinent past surgical history.  Family History  Problem Relation Age of Onset  . Diabetes Father     History  Substance Use Topics  . Smoking status: Current Every Day Smoker    Types: Cigarettes  . Smokeless tobacco: Not on file  . Alcohol Use: No      Review of Systems  Constitutional: Negative for fever.  HENT: Negative for congestion and sore throat.   Respiratory: Negative for cough and shortness of breath.   Cardiovascular: Negative for chest pain and leg swelling.  Gastrointestinal: Positive for nausea. Negative for vomiting and diarrhea.  Genitourinary: Positive for flank pain and testicular pain. Negative for hematuria.  Musculoskeletal: Positive for back pain.  Skin: Negative for rash.  Neurological: Negative for headaches.  All other systems reviewed and are negative.    Allergies  Review of patient's allergies indicates no known allergies.  Home Medications   Current Outpatient Rx  Name  Route  Sig  Dispense  Refill  .  HYDROCODONE-ACETAMINOPHEN 5-325 MG PO TABS   Oral   Take 1-2 tablets by mouth every 6 (six) hours as needed for pain.   10 tablet   0   . NAPROXEN 500 MG PO TABS   Oral   Take 1 tablet (500 mg total) by mouth 2 (two) times daily.   14 tablet   0   . PROMETHAZINE HCL 25 MG PO TABS   Oral   Take 1 tablet (25 mg total) by mouth every 6 (six) hours as needed for nausea.   12 tablet   0     BP 151/88  Pulse 95  Temp 97.5 F (36.4 C) (Oral)  Resp 20  Ht 5\' 11"  (1.803 m)  Wt 215 lb (97.523 kg)  BMI 29.99 kg/m2  SpO2 97%  Physical Exam  Nursing note and vitals reviewed. Constitutional: He is oriented to person, place, and time. He appears well-developed and well-nourished.  HENT:  Mouth/Throat: Oropharynx is clear and moist.  Eyes: Conjunctivae normal are normal. Pupils are equal, round, and reactive to light. Right eye exhibits no discharge. Left eye exhibits no discharge. Scleral icterus is present.  Neck: Normal range of motion.  Cardiovascular: Normal rate, regular rhythm and normal heart sounds.   No murmur heard. Pulmonary/Chest: Effort normal and breath sounds normal. No respiratory distress. He has no wheezes. He has no rales. He exhibits no tenderness.  Abdominal: Soft. Bowel sounds are normal. He exhibits no distension and no mass. There is no tenderness. There is no rebound and no guarding.  Musculoskeletal: Normal range of motion. He exhibits no edema and no tenderness.       No flank or cva tenderness.  Neurological: He is alert and oriented to person, place, and time. No cranial nerve deficit. He exhibits normal muscle tone. Coordination normal.  Skin: No rash noted. No erythema.    ED Course  Procedures (including critical care time)  DIAGNOSTIC STUDIES: Oxygen Saturation is 97% on room air, normal by my interpretation.    COORDINATION OF CARE:  20:29- Discussed planned course of treatment with the patient, including finding a regular urologist, who is  agreeable at this time.   Results for orders placed during the hospital encounter of 12/14/11  URINALYSIS, ROUTINE W REFLEX MICROSCOPIC      Component Value Range   Color, Urine YELLOW  YELLOW   APPearance CLEAR  CLEAR   Specific Gravity, Urine 1.025  1.005 - 1.030   pH 6.0  5.0 - 8.0   Glucose, UA NEGATIVE  NEGATIVE mg/dL   Hgb urine dipstick NEGATIVE  NEGATIVE   Bilirubin Urine NEGATIVE  NEGATIVE   Ketones, ur NEGATIVE  NEGATIVE mg/dL   Protein, ur NEGATIVE  NEGATIVE mg/dL   Urobilinogen, UA 0.2  0.0 - 1.0 mg/dL   Nitrite NEGATIVE  NEGATIVE   Leukocytes, UA NEGATIVE  NEGATIVE  CBC WITH DIFFERENTIAL      Component Value Range   WBC 15.1 (*) 4.0 - 10.5 K/uL   RBC 5.28  4.22 - 5.81 MIL/uL   Hemoglobin 16.4  13.0 - 17.0 g/dL   HCT 16.1  09.6 - 04.5 %   MCV 89.8  78.0 - 100.0 fL   MCH 31.1  26.0 - 34.0 pg   MCHC 34.6  30.0 - 36.0 g/dL   RDW 40.9  81.1 - 91.4 %   Platelets 255  150 - 400 K/uL   Neutrophils Relative 70  43 - 77 %   Neutro Abs 10.6 (*) 1.7 - 7.7 K/uL   Lymphocytes Relative 20  12 - 46 %   Lymphs Abs 3.0  0.7 - 4.0 K/uL   Monocytes Relative 7  3 - 12 %   Monocytes Absolute 1.0  0.1 - 1.0 K/uL   Eosinophils Relative 3  0 - 5 %   Eosinophils Absolute 0.4  0.0 - 0.7 K/uL   Basophils Relative 0  0 - 1 %   Basophils Absolute 0.1  0.0 - 0.1 K/uL  BASIC METABOLIC PANEL      Component Value Range   Sodium 137  135 - 145 mEq/L   Potassium 3.6  3.5 - 5.1 mEq/L   Chloride 101  96 - 112 mEq/L   CO2 26  19 - 32 mEq/L   Glucose, Bld 115 (*) 70 - 99 mg/dL   BUN 10  6 - 23 mg/dL   Creatinine, Ser 7.82  0.50 - 1.35 mg/dL   Calcium 9.7  8.4 - 95.6 mg/dL   GFR calc non Af Amer >90  >90 mL/min   GFR calc Af Amer >90  >90 mL/min    Labs Reviewed  CBC WITH DIFFERENTIAL - Abnormal; Notable for the following:    WBC 15.1 (*)     Neutro Abs 10.6 (*)     All other components within normal limits  BASIC METABOLIC PANEL - Abnormal; Notable for the following:    Glucose, Bld  115 (*)     All other components within normal limits  URINALYSIS, ROUTINE W REFLEX MICROSCOPIC  Ct Abdomen Pelvis Wo Contrast  12/14/2011  *RADIOLOGY REPORT*  Clinical Data: Left-sided flank pain.  History of urinary tract calculi.  CT ABDOMEN AND PELVIS WITHOUT CONTRAST  Technique:  Multidetector CT imaging of the abdomen and pelvis was performed following the standard protocol without intravenous contrast.  Comparison: CT abdomen and pelvis 09/29/2011, 09/15/2009, 06/15/2009, 02/07/2006.  Findings: Tiny (1-2 mm) non-obstructing calculi in upper and mid calyces of the left kidney.  No obstructing left ureteral calculi. No visible right urinary tract calculi.  Within the limits of the unenhanced technique, no focal parenchymal abnormality involving either kidney.  Normal unenhanced appearance of the liver, spleen, pancreas, and adrenal glands.  Gallbladder contracted as there is food within the stomach, but otherwise unremarkable.  No biliary ductal dilation. No visible aorto-iliofemoral atherosclerosis.  Scattered normal- sized retroperitoneal lymph nodes as noted previously; no new or enlarging lymphadenopathy.  Normal-appearing stomach, small bowel, and colon.  Normal appendix in the right upper pelvis.  No ascites.  Urinary bladder decompressed and unremarkable.  Phleboliths in both sides of the pelvis.  Normal-appearing prostate gland and seminal vesicles.  Bone window images unremarkable.  Visualized lung bases clear.  IMPRESSION:  1.  Non-obstructing calculi in upper and mid calyces of the left kidney.  No evidence of urinary tract obstruction on either side. No visible right urinary tract calculi. 2.  Otherwise normal examination.   Original Report Authenticated By: Hulan Saas, M.D.    Results for orders placed during the hospital encounter of 12/14/11  URINALYSIS, ROUTINE W REFLEX MICROSCOPIC      Component Value Range   Color, Urine YELLOW  YELLOW   APPearance CLEAR  CLEAR   Specific  Gravity, Urine 1.025  1.005 - 1.030   pH 6.0  5.0 - 8.0   Glucose, UA NEGATIVE  NEGATIVE mg/dL   Hgb urine dipstick NEGATIVE  NEGATIVE   Bilirubin Urine NEGATIVE  NEGATIVE   Ketones, ur NEGATIVE  NEGATIVE mg/dL   Protein, ur NEGATIVE  NEGATIVE mg/dL   Urobilinogen, UA 0.2  0.0 - 1.0 mg/dL   Nitrite NEGATIVE  NEGATIVE   Leukocytes, UA NEGATIVE  NEGATIVE  CBC WITH DIFFERENTIAL      Component Value Range   WBC 15.1 (*) 4.0 - 10.5 K/uL   RBC 5.28  4.22 - 5.81 MIL/uL   Hemoglobin 16.4  13.0 - 17.0 g/dL   HCT 16.1  09.6 - 04.5 %   MCV 89.8  78.0 - 100.0 fL   MCH 31.1  26.0 - 34.0 pg   MCHC 34.6  30.0 - 36.0 g/dL   RDW 40.9  81.1 - 91.4 %   Platelets 255  150 - 400 K/uL   Neutrophils Relative 70  43 - 77 %   Neutro Abs 10.6 (*) 1.7 - 7.7 K/uL   Lymphocytes Relative 20  12 - 46 %   Lymphs Abs 3.0  0.7 - 4.0 K/uL   Monocytes Relative 7  3 - 12 %   Monocytes Absolute 1.0  0.1 - 1.0 K/uL   Eosinophils Relative 3  0 - 5 %   Eosinophils Absolute 0.4  0.0 - 0.7 K/uL   Basophils Relative 0  0 - 1 %   Basophils Absolute 0.1  0.0 - 0.1 K/uL  BASIC METABOLIC PANEL      Component Value Range   Sodium 137  135 - 145 mEq/L   Potassium 3.6  3.5 - 5.1 mEq/L   Chloride 101  96 - 112 mEq/L  CO2 26  19 - 32 mEq/L   Glucose, Bld 115 (*) 70 - 99 mg/dL   BUN 10  6 - 23 mg/dL   Creatinine, Ser 0.98  0.50 - 1.35 mg/dL   Calcium 9.7  8.4 - 11.9 mg/dL   GFR calc non Af Amer >90  >90 mL/min   GFR calc Af Amer >90  >90 mL/min     1. Left flank pain       MDM  History of of kidney stones in the past. Today's CT without evidence of the left ureter stone. Patient may have passed one recently. As well the leukocytosis no renal insufficiency. We'll treat with anti-inflammatories some pain medicine and antinausea medicine referral to urology provided. No evidence urinary tract infection.    I personally performed the services described in this documentation, which was scribed in my presence. The  recorded information has been reviewed and considered.          Shelda Jakes, MD 12/14/11 2100

## 2011-12-14 NOTE — ED Notes (Signed)
Patient complaining of left flank pain starting today. States "I think I may have a kidney stone. I passed one a couple of days ago but it started hurting again today."

## 2011-12-14 NOTE — ED Notes (Signed)
Pt states that he passed a kidney stone the other day, pain started again this morning, hx of kidney stones

## 2011-12-23 ENCOUNTER — Emergency Department (HOSPITAL_COMMUNITY)
Admission: EM | Admit: 2011-12-23 | Discharge: 2011-12-23 | Disposition: A | Discharge status: Home / Self Care | Payer: Self-pay | Attending: Emergency Medicine | Admitting: Emergency Medicine

## 2011-12-23 ENCOUNTER — Encounter (HOSPITAL_COMMUNITY): Payer: Self-pay | Admitting: *Deleted

## 2011-12-23 ENCOUNTER — Emergency Department (HOSPITAL_COMMUNITY): Payer: Self-pay

## 2011-12-23 DIAGNOSIS — Z87442 Personal history of urinary calculi: Secondary | ICD-10-CM | POA: Insufficient documentation

## 2011-12-23 DIAGNOSIS — Z791 Long term (current) use of non-steroidal anti-inflammatories (NSAID): Secondary | ICD-10-CM | POA: Insufficient documentation

## 2011-12-23 DIAGNOSIS — N23 Unspecified renal colic: Secondary | ICD-10-CM | POA: Insufficient documentation

## 2011-12-23 DIAGNOSIS — F17200 Nicotine dependence, unspecified, uncomplicated: Secondary | ICD-10-CM | POA: Insufficient documentation

## 2011-12-23 LAB — URINALYSIS, ROUTINE W REFLEX MICROSCOPIC
Bilirubin Urine: NEGATIVE
Glucose, UA: NEGATIVE mg/dL
Hgb urine dipstick: NEGATIVE
Ketones, ur: NEGATIVE mg/dL
pH: 6 (ref 5.0–8.0)

## 2011-12-23 MED ORDER — HYDROCODONE-ACETAMINOPHEN 5-325 MG PO TABS: 1.0000 | ORAL_TABLET | Freq: Four times a day (QID) | ORAL | Status: DC | PRN

## 2011-12-23 MED ORDER — KETOROLAC TROMETHAMINE 60 MG/2ML IM SOLN
60.0000 mg | Freq: Once | INTRAMUSCULAR | Status: AC
  Administered 2011-12-23: 60 mg via INTRAMUSCULAR
  Filled 2011-12-23: qty 2

## 2011-12-23 MED ORDER — IBUPROFEN 600 MG PO TABS: 600.0000 mg | ORAL_TABLET | Freq: Three times a day (TID) | ORAL | Status: DC | PRN

## 2011-12-23 MED ORDER — OXYCODONE-ACETAMINOPHEN 5-325 MG PO TABS
1.0000 | ORAL_TABLET | Freq: Once | ORAL | Status: AC
  Administered 2011-12-23: 1 via ORAL
  Filled 2011-12-23: qty 1

## 2011-12-23 NOTE — ED Notes (Signed)
Dx with two kidney stones last week. States pain since 0400 after passing one of the stones. Pain to left flank and groin area.

## 2011-12-23 NOTE — ED Provider Notes (Signed)
History   This chart was scribed for Dale Co, MD by Sofie Rower, ED Scribe. The patient was seen in room APA12/APA12 and the patient's care was started at 4:04PM..    CSN: [id number]  Arrival date & time 12/23/11  1545   First MD Initiated Contact with Patient 12/23/11 1604      Chief Complaint  Patient presents with  . Nephrolithiasis     The history is provided by the patient.    VIRGLE ARTH is a 26 y.o. male , with a hx of groin pain (one week ago), who presents to the Emergency Department complaining of sudden, progressively worsening, nephrolithiasis, located at the left flank, radiating towards the left groin, onset today (12/23/11 at 4:00AM).  Associated symptoms include nausea and loss of appetite. The pt reports he was diagnosed with kidney stones last week and passed one kidney stone this morning, around 4:00AM.  The pt denies allergies to any medications.   The pt is a current everyday smoker, however, he does not drink alcohol.   Pt does not have a Insurance underwriter at present (due to insurance unavailability).     Past Medical History  Diagnosis Date  . Renal disorder   . Kidney stones     History reviewed. No pertinent past surgical history.  Family History  Problem Relation Age of Onset  . Diabetes Father     History  Substance Use Topics  . Smoking status: Current Every Day Smoker    Types: Cigarettes  . Smokeless tobacco: Not on file  . Alcohol Use: No      Review of Systems  All other systems reviewed and are negative.    Allergies  Review of patient's allergies indicates no known allergies.  Home Medications   Current Outpatient Rx  Name  Route  Sig  Dispense  Refill  . HYDROCODONE-ACETAMINOPHEN 5-325 MG PO TABS   Oral   Take 1-2 tablets by mouth every 6 (six) hours as needed for pain.   10 tablet   0   . NAPROXEN 500 MG PO TABS   Oral   Take 1 tablet (500 mg total) by mouth 2 (two) times daily.   14 tablet   0   .  PROMETHAZINE HCL 25 MG PO TABS   Oral   Take 1 tablet (25 mg total) by mouth every 6 (six) hours as needed for nausea.   12 tablet   0     BP 145/86  Pulse 98  Temp 98.1 F (36.7 C) (Oral)  Resp 16  Ht 5\' 11"  (1.803 m)  Wt 215 lb (97.523 kg)  BMI 29.99 kg/m2  SpO2 100%  Physical Exam  Nursing note and vitals reviewed. Constitutional: He is oriented to person, place, and time. He appears well-developed and well-nourished.  HENT:  Head: Normocephalic and atraumatic.  Eyes: EOM are normal.  Neck: Normal range of motion.  Cardiovascular: Normal rate, regular rhythm, normal heart sounds and intact distal pulses.   Pulmonary/Chest: Effort normal and breath sounds normal. No respiratory distress.  Abdominal: Soft. He exhibits no distension. There is no tenderness.  Musculoskeletal: Normal range of motion.       No significant flank pain detected.   Neurological: He is alert and oriented to person, place, and time.  Skin: Skin is warm and dry.  Psychiatric: He has a normal mood and affect. Judgment normal.    ED Course  Procedures (including critical care time)  DIAGNOSTIC STUDIES: Oxygen Saturation is  100% on room air, normal by my interpretation.    COORDINATION OF CARE:   4:11 PM- Treatment plan concerning evaluation of previous CT scan, pain management, and ultrasound discussed with patient. Pt agrees with treatment.     Results for orders placed during the hospital encounter of 12/23/11  URINALYSIS, ROUTINE W REFLEX MICROSCOPIC      Component Value Range   Color, Urine YELLOW  YELLOW   APPearance CLEAR  CLEAR   Specific Gravity, Urine 1.025  1.005 - 1.030   pH 6.0  5.0 - 8.0   Glucose, UA NEGATIVE  NEGATIVE mg/dL   Hgb urine dipstick NEGATIVE  NEGATIVE   Bilirubin Urine NEGATIVE  NEGATIVE   Ketones, ur NEGATIVE  NEGATIVE mg/dL   Protein, ur NEGATIVE  NEGATIVE mg/dL   Urobilinogen, UA 0.2  0.0 - 1.0 mg/dL   Nitrite NEGATIVE  NEGATIVE   Leukocytes, UA NEGATIVE   NEGATIVE   US Renal  12/23/2011  *RADIOLOGY REPORT*  Clinical Data: Nephrolithiasis.  Left flank pain.  RENAL/URINARY TRACT ULTRASOUND COMPLETE  Comparison:  CT 12/14/2011  Findings:  Right Kidney:  13.3 cm in length, normal.  Normal echogenicity.  No cyst, mass, stone or hydronephrosis.  Left Kidney:  .  11.4 cm in length, normal.  Normal echogenicity. Punctate calcifications visible at CT are not appreciable ultrasound.  No cyst or mass.  No hydronephrosis.  Bladder:  Normal appearance.  Bilateral ureteral jets evident.  IMPRESSION: Normal renal ultrasound.  Small calculi previously seen in the left kidney by CT are too small to visualize with ultrasound.  There is no hydronephrosis or other focal finding.   Original Report Authenticated By: Paulina Fusi, M.D.    I personally reviewed the imaging tests through PACS system I reviewed available ER/hospitalization records through the EMR     1. Renal colic       MDM  The patient's pain is improved at this time.  His no evidence of hydronephrosis.  His urine shows no signs of infection.  This is likely ongoing ureteral colic/spasm.:  Short course pain medicine.  Urology followup.      I personally performed the services described in this documentation, which was scribed in my presence. The recorded information has been reviewed and is accurate.      Dale Co, MD 12/23/11 1710

## 2012-02-23 ENCOUNTER — Encounter (HOSPITAL_COMMUNITY): Payer: Self-pay

## 2012-02-23 ENCOUNTER — Emergency Department (HOSPITAL_COMMUNITY)
Admission: EM | Admit: 2012-02-23 | Discharge: 2012-02-23 | Disposition: A | Discharge status: Home / Self Care | Payer: Self-pay | Attending: Emergency Medicine | Admitting: Emergency Medicine

## 2012-02-23 DIAGNOSIS — R51 Headache: Secondary | ICD-10-CM | POA: Insufficient documentation

## 2012-02-23 DIAGNOSIS — R110 Nausea: Secondary | ICD-10-CM | POA: Insufficient documentation

## 2012-02-23 DIAGNOSIS — F17200 Nicotine dependence, unspecified, uncomplicated: Secondary | ICD-10-CM | POA: Insufficient documentation

## 2012-02-23 DIAGNOSIS — Z87448 Personal history of other diseases of urinary system: Secondary | ICD-10-CM | POA: Insufficient documentation

## 2012-02-23 DIAGNOSIS — Z87442 Personal history of urinary calculi: Secondary | ICD-10-CM | POA: Insufficient documentation

## 2012-02-23 DIAGNOSIS — R42 Dizziness and giddiness: Secondary | ICD-10-CM | POA: Insufficient documentation

## 2012-02-23 MED ORDER — IBUPROFEN 800 MG PO TABS
800.0000 mg | ORAL_TABLET | Freq: Once | ORAL | Status: AC
  Administered 2012-02-23: 800 mg via ORAL
  Filled 2012-02-23: qty 1

## 2012-02-23 MED ORDER — ONDANSETRON 4 MG PO TBDP
4.0000 mg | ORAL_TABLET | Freq: Once | ORAL | Status: AC
  Administered 2012-02-23: 4 mg via ORAL
  Filled 2012-02-23: qty 1

## 2012-02-23 MED ORDER — HYDROCODONE-ACETAMINOPHEN 5-325 MG PO TABS
1.0000 | ORAL_TABLET | Freq: Once | ORAL | Status: AC
  Administered 2012-02-23: 1 via ORAL
  Filled 2012-02-23: qty 1

## 2012-02-23 MED ORDER — ONDANSETRON HCL 4 MG PO TABS: 4.0000 mg | ORAL_TABLET | Freq: Four times a day (QID) | ORAL | Status: DC

## 2012-02-23 NOTE — ED Provider Notes (Signed)
History     CSN: [id number]  Arrival date & time 02/23/12  0417   First MD Initiated Contact with Patient 02/23/12 0501      Chief Complaint  Patient presents with  . Headache    (Consider location/radiation/quality/duration/timing/severity/associated sxs/prior treatment) HPI Dale Martinez is a 27 y.o. male who presents to the Emergency Department complaining of headache that began last night around 2200. He went to work at 2300 and the headache got worse through the night. It was worse when he bent over and he began to develop some dizziness with position changes. Headache was associated with mild nausea. Denies vision changes, hearing changes, stiff neck, difficulty talking or swallowing, extremity weakness, fever, chills.He has taken no medicines.  Past Medical History  Diagnosis Date  . Renal disorder   . Kidney stones     History reviewed. No pertinent past surgical history.  Family History  Problem Relation Age of Onset  . Diabetes Father     History  Substance Use Topics  . Smoking status: Current Every Day Smoker -- 1.0 packs/day    Types: Cigarettes  . Smokeless tobacco: Not on file  . Alcohol Use: No      Review of Systems  Constitutional: Negative for fever.       10 Systems reviewed and are negative for acute change except as noted in the HPI.  HENT: Negative for congestion.   Eyes: Negative for discharge and redness.  Respiratory: Negative for cough and shortness of breath.   Cardiovascular: Negative for chest pain.  Gastrointestinal: Negative for vomiting and abdominal pain.  Musculoskeletal: Negative for back pain.  Skin: Negative for rash.  Neurological: Positive for headaches. Negative for syncope and numbness.  Psychiatric/Behavioral:       No behavior change.    Allergies  Review of patient's allergies indicates no known allergies.  Home Medications   Current Outpatient Rx  Name  Route  Sig  Dispense  Refill  .  HYDROCODONE-ACETAMINOPHEN 5-325 MG PO TABS   Oral   Take 1 tablet by mouth every 6 (six) hours as needed for pain.   15 tablet   0   . IBUPROFEN 600 MG PO TABS   Oral   Take 1 tablet (600 mg total) by mouth every 8 (eight) hours as needed for pain.   15 tablet   0   . NAPROXEN 500 MG PO TABS   Oral   Take 1 tablet (500 mg total) by mouth 2 (two) times daily.   14 tablet   0   . PROMETHAZINE HCL 25 MG PO TABS   Oral   Take 1 tablet (25 mg total) by mouth every 6 (six) hours as needed for nausea.   12 tablet   0     BP 145/97  Pulse 92  Temp 97.5 F (36.4 C) (Oral)  Resp 20  Ht 5\' 11"  (1.803 m)  Wt 210 lb (95.255 kg)  BMI 29.29 kg/m2  SpO2 97%  Physical Exam  Nursing note and vitals reviewed. Constitutional: He is oriented to person, place, and time.       Awake, alert, nontoxic appearance.  HENT:  Head: Atraumatic.  Eyes: Right eye exhibits no discharge. Left eye exhibits no discharge.  Neck: Neck supple.  Cardiovascular: Normal heart sounds.   Pulmonary/Chest: Effort normal. He exhibits no tenderness.  Abdominal: Soft. There is no tenderness. There is no rebound.  Musculoskeletal: He exhibits no tenderness.       Baseline  ROM, no obvious new focal weakness.  Neurological: He is alert and oriented to person, place, and time. He has normal reflexes. No cranial nerve deficit. Coordination normal.       Mental status and motor strength appears baseline for patient and situation.  Skin: No rash noted.  Psychiatric: He has a normal mood and affect.    ED Course  Procedures (including critical care time)    [phone] Patient feeling beter. MDM  Patient presents with a headache that began last night. It was associated with mild nausea and dizziness. Given ibuprofen, zofran  and vicodin with relief. Patient ambulated in the department without dizziness. Pt feels improved after observation and/or treatment in ED.Pt stable in ED with no significant deterioration in  condition.The patient appears reasonably screened and/or stabilized for discharge and I doubt any other medical condition or other [facility] requiring further screening, evaluation, or treatment in the ED at this time prior to discharge.  MDM Reviewed: nursing note and vitals           Nicoletta Dress. Colon Branch, MD 02/23/12 [phone]

## 2012-02-23 NOTE — ED Notes (Signed)
Back of head, headache. Started 11pm when he went to work, now pain increasing and gets dizzy when bending over. Slight nausea

## 2012-02-23 NOTE — ED Notes (Signed)
Dozing lightly on stretcher.

## 2012-02-24 ENCOUNTER — Emergency Department (HOSPITAL_COMMUNITY)
Admission: EM | Admit: 2012-02-24 | Discharge: 2012-02-25 | Disposition: A | Discharge status: Home / Self Care | Payer: Self-pay | Attending: Emergency Medicine | Admitting: Emergency Medicine

## 2012-02-24 ENCOUNTER — Encounter (HOSPITAL_COMMUNITY): Payer: Self-pay | Admitting: *Deleted

## 2012-02-24 DIAGNOSIS — Z87442 Personal history of urinary calculi: Secondary | ICD-10-CM | POA: Insufficient documentation

## 2012-02-24 DIAGNOSIS — F17200 Nicotine dependence, unspecified, uncomplicated: Secondary | ICD-10-CM | POA: Insufficient documentation

## 2012-02-24 DIAGNOSIS — R109 Unspecified abdominal pain: Secondary | ICD-10-CM | POA: Insufficient documentation

## 2012-02-24 DIAGNOSIS — Z87448 Personal history of other diseases of urinary system: Secondary | ICD-10-CM | POA: Insufficient documentation

## 2012-02-24 MED ORDER — HYDROMORPHONE HCL PF 1 MG/ML IJ SOLN
1.0000 mg | Freq: Once | INTRAMUSCULAR | Status: AC
  Administered 2012-02-25: 1 mg via INTRAMUSCULAR
  Filled 2012-02-24: qty 1

## 2012-02-24 MED ORDER — KETOROLAC TROMETHAMINE 60 MG/2ML IM SOLN
60.0000 mg | Freq: Once | INTRAMUSCULAR | Status: AC
  Administered 2012-02-25: 60 mg via INTRAMUSCULAR
  Filled 2012-02-24: qty 2

## 2012-02-24 NOTE — ED Provider Notes (Signed)
History    This chart was scribed for Vida Roller, MD, MD by Smitty Pluck, ED Scribe. The patient was seen in room APA07/APA07 and the patient's care was started at 11:26 PM.   CSN: [id number]  Arrival date & time 02/24/12  2249       Chief Complaint  Patient presents with  . Flank Pain    (Consider location/radiation/quality/duration/timing/severity/associated sxs/prior treatment) The history is provided by the patient and medical records. No language interpreter was used.   Dale Martinez is a 27 y.o. male who presents to the Emergency Department complaining of waxing and waning, moderate, throbbing and sharp and stabbing left flank pain and back pain radiating to groin onset today 5 hours ago. Pain lasts for 20 minutes at a time. Pt reports hx of kidney stones. The current pain feels similar. Pain is rated 7/10. Denies aggravating factors and states that there is no position that get him comfortable. He feels like the pain has to go away by itself. Reports having diarrhea. Denies dysuria, fever,chills, difficulty urinating, hematuria and any other pain.   Past Medical History  Diagnosis Date  . Renal disorder   . Kidney stones     History reviewed. No pertinent past surgical history.  Family History  Problem Relation Age of Onset  . Diabetes Father     History  Substance Use Topics  . Smoking status: Current Every Day Smoker -- 1.0 packs/day    Types: Cigarettes  . Smokeless tobacco: Not on file  . Alcohol Use: No      Review of Systems  Constitutional: Negative for fever and chills.  Gastrointestinal: Positive for diarrhea ( described as loose stools, not watery, not bloody).  Genitourinary: Positive for flank pain. Negative for dysuria, hematuria and difficulty urinating.  All other systems reviewed and are negative.    Allergies  Review of patient's allergies indicates no known allergies.  Home Medications   Current Outpatient Rx  Name  Route  Sig   Dispense  Refill  . HYDROCODONE-ACETAMINOPHEN 5-325 MG PO TABS   Oral   Take 2 tablets by mouth every 4 (four) hours as needed for pain.   20 tablet   0   . NAPROXEN 500 MG PO TABS   Oral   Take 1 tablet (500 mg total) by mouth 2 (two) times daily with a meal.   30 tablet   0   . PROMETHAZINE HCL 25 MG PO TABS   Oral   Take 1 tablet (25 mg total) by mouth every 6 (six) hours as needed for nausea.   12 tablet   0   . TAMSULOSIN HCL 0.4 MG PO CAPS   Oral   Take 1 capsule (0.4 mg total) by mouth 2 (two) times daily.   10 capsule   0     BP 145/78  Pulse 94  Temp 97.6 F (36.4 C) (Oral)  Resp 18  Ht 5\' 11"  (1.803 m)  Wt 207 lb (93.895 kg)  BMI 28.87 kg/m2  SpO2 98%  Physical Exam  Nursing note and vitals reviewed. Constitutional: He appears well-developed and well-nourished. No distress.  HENT:  Head: Normocephalic and atraumatic.  Eyes: Conjunctivae normal are normal.  Neck: Normal range of motion. Neck supple.  Cardiovascular: Normal rate, regular rhythm and normal heart sounds.   Pulmonary/Chest: Effort normal and breath sounds normal. No respiratory distress. He has no wheezes. He has no rales.  Abdominal: Soft. Bowel sounds are normal. He exhibits no  distension. There is no tenderness. There is CVA tenderness (mild left). There is no rebound and no guarding.  Musculoskeletal: Normal range of motion. He exhibits no edema and no tenderness.  Neurological: He is alert. Coordination normal.  Skin: Skin is warm and dry.  Psychiatric: He has a normal mood and affect. His behavior is normal.    ED Course  Procedures (including critical care time) DIAGNOSTIC STUDIES: Oxygen Saturation is 98% on room air, normal by my interpretation.    COORDINATION OF CARE: 11:31 PM Discussed ED treatment with pt     Labs Reviewed  URINALYSIS, ROUTINE W REFLEX MICROSCOPIC - Abnormal; Notable for the following:    Specific Gravity, Urine >1.030 (*)     All other components  within normal limits   No results found.   1. Flank pain       MDM  Overall the patient is well-appearing.  Will obtain a urinalysis to evaluate for hematuria or infection however this to the patient's CAT scan from November of 2013 he had 2 tiny nonobstructing renal stones on left side. I suspect that his symptoms are coming from her ureteral passing of the stones and does at this time it is not necessary to obtain imaging and expose the patient to harmful radiation. He is in agreement to just getting medication and checking urinalysis at this time.  Urinalysis reveals no signs of hematuria. Patient overall appears clinically stable and can followup with urology as an outpatient. Prescriptions for kidney stone given. Toradol and Dilaudid given prior to discharge  I personally performed the services described in this documentation, which was scribed in my presence. The recorded information has been reviewed and is accurate.        Vida Roller, MD 02/25/12 [phone]

## 2012-02-24 NOTE — ED Notes (Signed)
Lt flank pain,no vomiting, Has had diarrhea.  Hx of kidney stones

## 2012-02-25 LAB — URINALYSIS, ROUTINE W REFLEX MICROSCOPIC
Bilirubin Urine: NEGATIVE
Glucose, UA: NEGATIVE mg/dL
Hgb urine dipstick: NEGATIVE
Nitrite: NEGATIVE
Specific Gravity, Urine: >1.03 — ABNORMAL HIGH (ref 1.005–1.030)
pH: 6 (ref 5.0–8.0)

## 2012-02-25 MED ORDER — TAMSULOSIN HCL 0.4 MG PO CAPS: 0.4000 mg | ORAL_CAPSULE | Freq: Two times a day (BID) | ORAL | Status: DC

## 2012-02-25 MED ORDER — PROMETHAZINE HCL 25 MG PO TABS: 25.0000 mg | ORAL_TABLET | Freq: Four times a day (QID) | ORAL | Status: DC | PRN

## 2012-02-25 MED ORDER — HYDROCODONE-ACETAMINOPHEN 5-325 MG PO TABS: 2.0000 | ORAL_TABLET | ORAL | Status: DC | PRN

## 2012-02-25 MED ORDER — NAPROXEN 500 MG PO TABS: 500.0000 mg | ORAL_TABLET | Freq: Two times a day (BID) | ORAL | Status: DC

## 2012-04-20 ENCOUNTER — Emergency Department (HOSPITAL_COMMUNITY)
Admission: EM | Admit: 2012-04-20 | Discharge: 2012-04-20 | Disposition: A | Discharge status: Home / Self Care | Payer: Self-pay | Attending: Emergency Medicine | Admitting: Emergency Medicine

## 2012-04-20 ENCOUNTER — Encounter (HOSPITAL_COMMUNITY): Payer: Self-pay

## 2012-04-20 DIAGNOSIS — R300 Dysuria: Secondary | ICD-10-CM | POA: Insufficient documentation

## 2012-04-20 DIAGNOSIS — F17200 Nicotine dependence, unspecified, uncomplicated: Secondary | ICD-10-CM | POA: Insufficient documentation

## 2012-04-20 DIAGNOSIS — R110 Nausea: Secondary | ICD-10-CM | POA: Insufficient documentation

## 2012-04-20 DIAGNOSIS — Z87442 Personal history of urinary calculi: Secondary | ICD-10-CM | POA: Insufficient documentation

## 2012-04-20 DIAGNOSIS — Z87448 Personal history of other diseases of urinary system: Secondary | ICD-10-CM | POA: Insufficient documentation

## 2012-04-20 DIAGNOSIS — R109 Unspecified abdominal pain: Secondary | ICD-10-CM

## 2012-04-20 LAB — URINALYSIS, ROUTINE W REFLEX MICROSCOPIC
Nitrite: NEGATIVE
Protein, ur: NEGATIVE mg/dL
Specific Gravity, Urine: >1.03 — ABNORMAL HIGH (ref 1.005–1.030)
Urobilinogen, UA: 0.2 mg/dL (ref 0.0–1.0)

## 2012-04-20 LAB — URINE MICROSCOPIC-ADD ON

## 2012-04-20 MED ORDER — KETOROLAC TROMETHAMINE 60 MG/2ML IM SOLN
60.0000 mg | Freq: Once | INTRAMUSCULAR | Status: AC
  Administered 2012-04-20: 60 mg via INTRAMUSCULAR
  Filled 2012-04-20: qty 2

## 2012-04-20 MED ORDER — OXYCODONE-ACETAMINOPHEN 10-325 MG PO TABS: 1.0000 | ORAL_TABLET | ORAL | Status: DC | PRN

## 2012-04-20 MED ORDER — ONDANSETRON 4 MG PO TBDP
4.0000 mg | ORAL_TABLET | Freq: Once | ORAL | Status: AC
  Administered 2012-04-20: 4 mg via ORAL
  Filled 2012-04-20: qty 1

## 2012-04-20 NOTE — ED Provider Notes (Signed)
History     CSN: [id number]  Arrival date & time 04/20/12  7829   First MD Initiated Contact with Patient 04/20/12 [phone]      Chief Complaint  Patient presents with  . Flank Pain    (Consider location/radiation/quality/duration/timing/severity/associated sxs/prior treatment) Patient is a 27 y.o. male presenting with flank pain. The history is provided by the patient. No language interpreter was used.  Flank Pain This is a recurrent problem. The current episode started yesterday (last night around 10pm). The problem occurs constantly. The problem has been waxing and waning. Associated symptoms include nausea and urinary symptoms. Pertinent negatives include no anorexia, chills, fever or vomiting. Exacerbated by: urinating. He has tried nothing for the symptoms.    Past Medical History  Diagnosis Date  . Renal disorder   . Kidney stones     History reviewed. No pertinent past surgical history.  Family History  Problem Relation Age of Onset  . Diabetes Father     History  Substance Use Topics  . Smoking status: Current Every Day Smoker -- 1.00 packs/day    Types: Cigarettes  . Smokeless tobacco: Not on file  . Alcohol Use: No      Review of Systems  Constitutional: Negative for fever and chills.  Gastrointestinal: Positive for nausea. Negative for vomiting and anorexia.       Right groin pain  Genitourinary: Positive for dysuria and flank pain. Negative for hematuria.  All other systems reviewed and are negative.    Allergies  Review of patient's allergies indicates no known allergies.  Home Medications   Current Outpatient Rx  Name  Route  Sig  Dispense  Refill  . HYDROcodone-acetaminophen (NORCO/VICODIN) 5-325 MG per tablet   Oral   Take 2 tablets by mouth every 4 (four) hours as needed for pain.   20 tablet   0   . naproxen (NAPROSYN) 500 MG tablet   Oral   Take 1 tablet (500 mg total) by mouth 2 (two) times daily with a meal.   30 tablet   0   .  promethazine (PHENERGAN) 25 MG tablet   Oral   Take 1 tablet (25 mg total) by mouth every 6 (six) hours as needed for nausea.   12 tablet   0   . Tamsulosin HCl (FLOMAX) 0.4 MG CAPS   Oral   Take 1 capsule (0.4 mg total) by mouth 2 (two) times daily.   10 capsule   0     BP 121/71  Pulse 86  Temp(Src) 98.1 F (36.7 C) (Oral)  Resp 16  Ht 5\' 11"  (1.803 m)  Wt 205 lb (92.987 kg)  BMI 28.6 kg/m2  SpO2 98%  Physical Exam  Nursing note and vitals reviewed. Constitutional: He is oriented to person, place, and time. He appears well-developed and well-nourished.  HENT:  Head: Normocephalic and atraumatic.  Neck: Normal range of motion.  Cardiovascular: Normal rate, regular rhythm and normal heart sounds.   Pulmonary/Chest: Effort normal and breath sounds normal. No respiratory distress. He exhibits no tenderness.  Abdominal: Soft. Bowel sounds are normal. He exhibits no distension and no mass. There is tenderness. There is no rebound and no guarding.  Mild right CVAT.  Mild TTP in RLQ, in right groin  Genitourinary: Penis normal. No penile tenderness.  No testicular tenderness.  Musculoskeletal: Normal range of motion.  Neurological: He is alert and oriented to person, place, and time.  Skin: Skin is warm and dry.    ED  Course  Procedures (including critical care time)  Labs Reviewed  URINALYSIS, ROUTINE W REFLEX MICROSCOPIC - Abnormal; Notable for the following:    Specific Gravity, Urine >1.030 (*)    Hgb urine dipstick TRACE (*)    Bilirubin Urine SMALL (*)    Leukocytes, UA TRACE (*)    All other components within normal limits  URINE MICROSCOPIC-ADD ON   No results found.   1. Right flank pain       MDM  Pt with extensive hx of kidney stones presenting today with similar right flank and groin pain for past 8 hours.  States he has not tried anything at home for pain relief.  Received CT on 12/23/11 showing nephrolithiasis.  Was seen on 02/24/12 for the same  pain.  Today, appears to uncomfortable, will give toradol and zofran.  Waiting on UA to check for infection and/or hematuria.  Mild right CVAT.  Performed testicular exam.  Mild right groin pain, does not radiate into testicles.  No reason to believe testicular torsion.  UA: nl. No hematuria.  No reason to perform imaging at this time due to lack of hematuria and patient appearing well.  No fever or chills.  Will discharge home with  Percocet for pain.  F/u with urology. Return to ED if worsening symptoms.  Vitals: unremarkable. Pt discharged in stable condition.       Junius Finner, PA-C 04/20/12 0805  Junius Finner, PA-C 04/20/12 0806  Junius Finner, PA-C 04/20/12 1610  Junius Finner, PA-C 04/20/12 [id number]

## 2012-04-20 NOTE — ED Provider Notes (Signed)
Medical screening examination/treatment/procedure(s) were conducted as a shared visit with non-physician practitioner(s) and myself.  I personally evaluated the patient during the encounter  Pt well appearing, watching TV, he is in no distress.  Stable for d/c.  Do not feel further imaging warranted  Joya Gaskins, MD 04/20/12 [phone]

## 2012-04-20 NOTE — ED Notes (Signed)
Pt c/o R flank pain since 10pm last night.  Reports pain radiates around to lower abd.  Pt c/o feeling nauseated, no vomiting.  Reports history of kidney stones.

## 2012-05-04 ENCOUNTER — Emergency Department (HOSPITAL_COMMUNITY)
Admission: EM | Admit: 2012-05-04 | Discharge: 2012-05-04 | Disposition: A | Discharge status: Home / Self Care | Payer: Self-pay | Attending: Emergency Medicine | Admitting: Emergency Medicine

## 2012-05-04 ENCOUNTER — Encounter (HOSPITAL_COMMUNITY): Payer: Self-pay | Admitting: *Deleted

## 2012-05-04 DIAGNOSIS — Z87448 Personal history of other diseases of urinary system: Secondary | ICD-10-CM | POA: Insufficient documentation

## 2012-05-04 DIAGNOSIS — Z87442 Personal history of urinary calculi: Secondary | ICD-10-CM | POA: Insufficient documentation

## 2012-05-04 DIAGNOSIS — F17200 Nicotine dependence, unspecified, uncomplicated: Secondary | ICD-10-CM | POA: Insufficient documentation

## 2012-05-04 DIAGNOSIS — N390 Urinary tract infection, site not specified: Secondary | ICD-10-CM | POA: Insufficient documentation

## 2012-05-04 DIAGNOSIS — R109 Unspecified abdominal pain: Secondary | ICD-10-CM | POA: Insufficient documentation

## 2012-05-04 LAB — URINALYSIS, ROUTINE W REFLEX MICROSCOPIC
Bilirubin Urine: NEGATIVE
Glucose, UA: NEGATIVE mg/dL
Protein, ur: 30 mg/dL — AB
Specific Gravity, Urine: 1.02 (ref 1.005–1.030)

## 2012-05-04 LAB — URINE MICROSCOPIC-ADD ON

## 2012-05-04 MED ORDER — CEPHALEXIN 500 MG PO CAPS
500.0000 mg | ORAL_CAPSULE | Freq: Once | ORAL | Status: AC
  Administered 2012-05-04: 500 mg via ORAL
  Filled 2012-05-04: qty 1

## 2012-05-04 MED ORDER — HYDROCODONE-ACETAMINOPHEN 5-325 MG PO TABS
1.0000 | ORAL_TABLET | Freq: Once | ORAL | Status: AC
  Administered 2012-05-04: 1 via ORAL
  Filled 2012-05-04: qty 1

## 2012-05-04 MED ORDER — CEPHALEXIN 500 MG PO CAPS: 500.0000 mg | ORAL_CAPSULE | Freq: Four times a day (QID) | ORAL | Status: DC

## 2012-05-04 MED ORDER — TRAMADOL HCL 50 MG PO TABS: 50.0000 mg | ORAL_TABLET | Freq: Four times a day (QID) | ORAL | Status: DC | PRN

## 2012-05-04 NOTE — ED Notes (Signed)
hematuria

## 2012-05-05 LAB — URINE CULTURE

## 2012-05-05 NOTE — ED Provider Notes (Signed)
History     CSN: [id number]  Arrival date & time 05/04/12  1839   First MD Initiated Contact with Patient 05/04/12 1932      Chief Complaint  Patient presents with  . Hematuria    (Consider location/radiation/quality/duration/timing/severity/associated sxs/prior treatment) Patient is a 27 y.o. male presenting with hematuria. The history is provided by the patient (the pt complains of blood in his urine). No language interpreter was used.  Hematuria This is a new problem. The current episode started yesterday. The problem occurs constantly. The problem has not changed since onset.Associated symptoms include abdominal pain. Pertinent negatives include no chest pain and no headaches. Nothing aggravates the symptoms. Nothing relieves the symptoms.    Past Medical History  Diagnosis Date  . Renal disorder   . Kidney stones     History reviewed. No pertinent past surgical history.  Family History  Problem Relation Age of Onset  . Diabetes Father     History  Substance Use Topics  . Smoking status: Current Every Day Smoker -- 1.00 packs/day    Types: Cigarettes  . Smokeless tobacco: Not on file  . Alcohol Use: No      Review of Systems  Constitutional: Negative for fatigue.  HENT: Negative for congestion, sinus pressure and ear discharge.   Eyes: Negative for discharge.  Respiratory: Negative for cough.   Cardiovascular: Negative for chest pain.  Gastrointestinal: Positive for abdominal pain. Negative for diarrhea.  Genitourinary: Positive for hematuria. Negative for frequency.  Musculoskeletal: Negative for back pain.  Skin: Negative for rash.  Neurological: Negative for seizures and headaches.  Psychiatric/Behavioral: Negative for hallucinations.    Allergies  Review of patient's allergies indicates no known allergies.  Home Medications   Current Outpatient Rx  Name  Route  Sig  Dispense  Refill  . cephALEXin (KEFLEX) 500 MG capsule   Oral   Take 1 capsule  (500 mg total) by mouth 4 (four) times daily.   28 capsule   0   . traMADol (ULTRAM) 50 MG tablet   Oral   Take 1 tablet (50 mg total) by mouth every 6 (six) hours as needed for pain.   20 tablet   0     BP 132/71  Pulse 90  Temp(Src) 98.4 F (36.9 C) (Oral)  Resp 20  Ht 5\' 11"  (1.803 m)  Wt 195 lb 4.8 oz (88.587 kg)  BMI 27.25 kg/m2  SpO2 98%  Physical Exam  Constitutional: He is oriented to person, place, and time. He appears well-developed.  HENT:  Head: Normocephalic and atraumatic.  Eyes: Conjunctivae and EOM are normal. No scleral icterus.  Neck: Neck supple. No thyromegaly present.  Cardiovascular: Normal rate and regular rhythm.  Exam reveals no gallop and no friction rub.   No murmur heard. Pulmonary/Chest: No stridor. He has no wheezes. He has no rales. He exhibits no tenderness.  Abdominal: He exhibits no distension. There is no tenderness. There is no rebound.  Musculoskeletal: Normal range of motion. He exhibits no edema.  Lymphadenopathy:    He has no cervical adenopathy.  Neurological: He is oriented to person, place, and time. Coordination normal.  Skin: No rash noted. No erythema.  Psychiatric: He has a normal mood and affect. His behavior is normal.    ED Course  Procedures (including critical care time)  Labs Reviewed  URINALYSIS, ROUTINE W REFLEX MICROSCOPIC - Abnormal; Notable for the following:    APPearance CLOUDY (*)    Hgb urine dipstick SMALL (*)  Protein, ur 30 (*)    Leukocytes, UA SMALL (*)    All other components within normal limits  URINE MICROSCOPIC-ADD ON - Abnormal; Notable for the following:    Bacteria, UA MANY (*)    All other components within normal limits  URINE CULTURE   No results found.   1. UTI (lower urinary tract infection)       MDM          Benny Lennert, MD 05/05/12 [phone]

## 2012-05-23 ENCOUNTER — Emergency Department (HOSPITAL_COMMUNITY)
Admission: EM | Admit: 2012-05-23 | Discharge: 2012-05-23 | Disposition: A | Discharge status: Home / Self Care | Payer: Self-pay | Attending: Emergency Medicine | Admitting: Emergency Medicine

## 2012-05-23 ENCOUNTER — Encounter (HOSPITAL_COMMUNITY): Payer: Self-pay | Admitting: *Deleted

## 2012-05-23 ENCOUNTER — Emergency Department (HOSPITAL_COMMUNITY): Payer: Self-pay

## 2012-05-23 DIAGNOSIS — Z87448 Personal history of other diseases of urinary system: Secondary | ICD-10-CM | POA: Insufficient documentation

## 2012-05-23 DIAGNOSIS — Z87442 Personal history of urinary calculi: Secondary | ICD-10-CM | POA: Insufficient documentation

## 2012-05-23 DIAGNOSIS — R109 Unspecified abdominal pain: Secondary | ICD-10-CM | POA: Insufficient documentation

## 2012-05-23 DIAGNOSIS — F17200 Nicotine dependence, unspecified, uncomplicated: Secondary | ICD-10-CM | POA: Insufficient documentation

## 2012-05-23 LAB — URINALYSIS, ROUTINE W REFLEX MICROSCOPIC
Glucose, UA: NEGATIVE mg/dL
Hgb urine dipstick: NEGATIVE
Specific Gravity, Urine: 1.02 (ref 1.005–1.030)

## 2012-05-23 MED ORDER — KETOROLAC TROMETHAMINE 30 MG/ML IJ SOLN
30.0000 mg | Freq: Once | INTRAMUSCULAR | Status: AC
  Administered 2012-05-23: 30 mg via INTRAVENOUS
  Filled 2012-05-23: qty 1

## 2012-05-23 MED ORDER — OXYCODONE-ACETAMINOPHEN 5-325 MG PO TABS: 2.0000 | ORAL_TABLET | ORAL | Status: DC | PRN

## 2012-05-23 MED ORDER — ONDANSETRON HCL 4 MG/2ML IJ SOLN
4.0000 mg | Freq: Once | INTRAMUSCULAR | Status: AC
  Administered 2012-05-23: 4 mg via INTRAVENOUS
  Filled 2012-05-23: qty 2

## 2012-05-23 MED ORDER — SODIUM CHLORIDE 0.9 % IV BOLUS (SEPSIS)
500.0000 mL | Freq: Once | INTRAVENOUS | Status: AC
  Administered 2012-05-23: 500 mL via INTRAVENOUS

## 2012-05-23 MED ORDER — PROMETHAZINE HCL 25 MG PO TABS: 25.0000 mg | ORAL_TABLET | Freq: Four times a day (QID) | ORAL | Status: DC | PRN

## 2012-05-23 NOTE — ED Notes (Signed)
Lt flank pain, nausea, without vomiting, Hx of kidney stones

## 2012-05-23 NOTE — ED Provider Notes (Signed)
History  This chart was scribed for Donnetta Hutching, MD by Bennett Scrape, ED Scribe. This patient was seen in room APA07/APA07 and the patient's care was started at 6:47 PM.  CSN: [id number]  Arrival date & time 05/23/12  1839   First MD Initiated Contact with Patient 05/23/12 1847      Chief Complaint  Patient presents with  . Flank Pain     The history is provided by the patient. No language interpreter was used.    Dale Martinez is a 27 y.o. male with a h/o kidney stones who presents to the Emergency Department complaining of left flank pain that radiates around to the LLQ that started this morning. He reports 10 prior episodes of similar symptoms diagnosed as kidney stones. He denies frequent visits to the ED for the same. He states that he has not followed up with a specialist yet stating that he is waiting to get insurance. He denies testicular pain, dysuria, hematuria, fevers, chills and emesis as associated symptoms.  Works in a [employer]  Past Medical History  Diagnosis Date  . Renal disorder   . Kidney stones     History reviewed. No pertinent past surgical history.  Family History  Problem Relation Age of Onset  . Diabetes Father     History  Substance Use Topics  . Smoking status: Current Every Day Smoker -- 1.00 packs/day    Types: Cigarettes  . Smokeless tobacco: Not on file  . Alcohol Use: No      Review of Systems  A complete 10 system review of systems was obtained and all systems are negative except as noted in the HPI and PMH.    Allergies  Review of patient's allergies indicates no known allergies.  Home Medications   Current Outpatient Rx  Name  Route  Sig  Dispense  Refill  . cephALEXin (KEFLEX) 500 MG capsule   Oral   Take 1 capsule (500 mg total) by mouth 4 (four) times daily.   28 capsule   0   . traMADol (ULTRAM) 50 MG tablet   Oral   Take 1 tablet (50 mg total) by mouth every 6 (six) hours as needed for pain.   20 tablet   0      Triage Vitals: BP 123/80  Pulse 118  Temp(Src) 97.1 F (36.2 C) (Oral)  Resp 20  Ht 5\' 11"  (1.803 m)  Wt 195 lb (88.451 kg)  BMI 27.21 kg/m2  SpO2 98%  Physical Exam  Nursing note and vitals reviewed. Constitutional: He is oriented to person, place, and time. He appears well-developed and well-nourished.  HENT:  Head: Normocephalic and atraumatic.  Eyes: Conjunctivae and EOM are normal. Pupils are equal, round, and reactive to light.  Neck: Normal range of motion. Neck supple.  Cardiovascular: Normal rate, regular rhythm and normal heart sounds.   Pulmonary/Chest: Effort normal and breath sounds normal.  Abdominal: Soft. Bowel sounds are normal. There is tenderness (mild LLQ tenderness). There is no rebound and no guarding.  Musculoskeletal: Normal range of motion. He exhibits tenderness.  Mild left flank tenderness  Neurological: He is alert and oriented to person, place, and time.  Skin: Skin is warm and dry.  Psychiatric: He has a normal mood and affect.    ED Course  Procedures (including critical care time)  DIAGNOSTIC STUDIES: Oxygen Saturation is 98% on room air, normal by my interpretation.    COORDINATION OF CARE: 7:05 PM-Discussed treatment plan which includes XR, CBC  panel, CMP, and UA with pt at bedside and pt agreed to plan.   Labs Reviewed  URINALYSIS, ROUTINE W REFLEX MICROSCOPIC - Abnormal; Notable for the following:    Ketones, ur TRACE (*)    All other components within normal limits   Dg Abd 1 View  05/23/2012  *RADIOLOGY REPORT*  Clinical Data: Left flank pain.  ABDOMEN - 1 VIEW  Comparison: None  Findings: The bowel gas pattern is normal.  No worrisome calcifications are seen over the kidneys or expected course of the ureters.  The bony structures are unremarkable.  IMPRESSION: Unremarkable abdominal radiograph.   Original Report Authenticated By: Rudie Meyer, M.D.      No diagnosis found. Results for orders placed during the hospital  encounter of 05/23/12  URINALYSIS, ROUTINE W REFLEX MICROSCOPIC      Result Value Range   Color, Urine YELLOW  YELLOW   APPearance CLEAR  CLEAR   Specific Gravity, Urine 1.020  1.005 - 1.030   pH 6.5  5.0 - 8.0   Glucose, UA NEGATIVE  NEGATIVE mg/dL   Hgb urine dipstick NEGATIVE  NEGATIVE   Bilirubin Urine NEGATIVE  NEGATIVE   Ketones, ur TRACE (*) NEGATIVE mg/dL   Protein, ur NEGATIVE  NEGATIVE mg/dL   Urobilinogen, UA 0.2  0.0 - 1.0 mg/dL   Nitrite NEGATIVE  NEGATIVE   Leukocytes, UA NEGATIVE  NEGATIVE    Dg Abd 1 View  05/23/2012  *RADIOLOGY REPORT*  Clinical Data: Left flank pain.  ABDOMEN - 1 VIEW  Comparison: None  Findings: The bowel gas pattern is normal.  No worrisome calcifications are seen over the kidneys or expected course of the ureters.  The bony structures are unremarkable.  IMPRESSION: Unremarkable abdominal radiograph.   Original Report Authenticated By: Rudie Meyer, M.D.      MDM  Patient no acute distress. Urinalysis and KUB are unremarkable.  No acute abdomen    I personally performed the services described in this documentation, which was scribed in my presence. The recorded information has been reviewed and is accurate.      Donnetta Hutching, MD 05/23/12 [phone]

## 2012-05-23 NOTE — ED Notes (Signed)
Pt wants additional pain meds, EDP aware.

## 2012-06-11 ENCOUNTER — Emergency Department (HOSPITAL_COMMUNITY)
Admission: EM | Admit: 2012-06-11 | Discharge: 2012-06-11 | Disposition: A | Discharge status: Home / Self Care | Payer: Self-pay | Attending: Emergency Medicine | Admitting: Emergency Medicine

## 2012-06-11 ENCOUNTER — Emergency Department (HOSPITAL_COMMUNITY): Payer: Self-pay

## 2012-06-11 ENCOUNTER — Encounter (HOSPITAL_COMMUNITY): Payer: Self-pay | Admitting: Emergency Medicine

## 2012-06-11 DIAGNOSIS — R110 Nausea: Secondary | ICD-10-CM | POA: Insufficient documentation

## 2012-06-11 DIAGNOSIS — Z87448 Personal history of other diseases of urinary system: Secondary | ICD-10-CM | POA: Insufficient documentation

## 2012-06-11 DIAGNOSIS — Z87442 Personal history of urinary calculi: Secondary | ICD-10-CM | POA: Insufficient documentation

## 2012-06-11 DIAGNOSIS — F17200 Nicotine dependence, unspecified, uncomplicated: Secondary | ICD-10-CM | POA: Insufficient documentation

## 2012-06-11 DIAGNOSIS — N50811 Right testicular pain: Secondary | ICD-10-CM

## 2012-06-11 DIAGNOSIS — N509 Disorder of male genital organs, unspecified: Secondary | ICD-10-CM | POA: Insufficient documentation

## 2012-06-11 LAB — URINALYSIS, ROUTINE W REFLEX MICROSCOPIC
Glucose, UA: NEGATIVE mg/dL
Leukocytes, UA: NEGATIVE
Specific Gravity, Urine: 1.025 (ref 1.005–1.030)
pH: 6.5 (ref 5.0–8.0)

## 2012-06-11 NOTE — Discharge Instructions (Signed)
Urine sample and ultrasound were both completely normal.   Tylenol or ibuprofen for pain.   Rest.  No further testing necessary at this time

## 2012-06-11 NOTE — ED Notes (Signed)
States that he started having pain in his right testicle last night, states he is feeling somewhat nauseated as well.

## 2012-06-11 NOTE — ED Notes (Signed)
Discharge instructions reviewed with pt, questions answered. Pt verbalized understanding.  

## 2012-06-11 NOTE — ED Notes (Signed)
Pt results reviewed, pt verbalized undertsanding

## 2012-06-11 NOTE — ED Provider Notes (Signed)
History  This chart was scribed for Donnetta Hutching, MD by Bennett Scrape, ED Scribe. This patient was seen in room APA05/APA05 and the patient's care was started at 4:19 PM.  CSN: [id number]  Arrival date & time 06/11/12  1537   First MD Initiated Contact with Patient 06/11/12 1606      Chief Complaint  Patient presents with  . Testicle Pain  . Nausea     The history is provided by the patient. No language interpreter was used.    HPI Comments: Dale Martinez is a 27 y.o. male who presents to the Emergency Department complaining of sudden onset, gradually worsening, constant right testicular pain that started around 8 PM last night while at work. He states that he was bent over picking up "little packages of yarn" when the pain started. He denies any known falls or trauma. He has a h/o kidney stones but denies similarities and denies having prior episodes of similar symptoms. He denies penile pain or discharge and difficulty urinating as associated symptoms. Pt is a current everyday smoker but denies alcohol use.   Past Medical History  Diagnosis Date  . Renal disorder   . Kidney stones     History reviewed. No pertinent past surgical history.  Family History  Problem Relation Age of Onset  . Diabetes Father     History  Substance Use Topics  . Smoking status: Current Every Day Smoker -- 1.00 packs/day    Types: Cigarettes  . Smokeless tobacco: Not on file  . Alcohol Use: No      Review of Systems  A complete 10 system review of systems was obtained and all systems are negative except as noted in the HPI and PMH.   Allergies  Review of patient's allergies indicates no known allergies.  Home Medications   Current Outpatient Rx  Name  Route  Sig  Dispense  Refill  . oxyCODONE-acetaminophen (PERCOCET) 5-325 MG per tablet   Oral   Take 2 tablets by mouth every 4 (four) hours as needed for pain.   10 tablet   0   . promethazine (PHENERGAN) 25 MG tablet   Oral    Take 1 tablet (25 mg total) by mouth every 6 (six) hours as needed.   10 tablet   0     Triage Vitals: BP 117/68  Temp(Src) 97.5 F (36.4 C) (Oral)  Resp 18  Ht 5\' 11"  (1.803 m)  Wt 195 lb (88.451 kg)  BMI 27.21 kg/m2  SpO2 99%  Physical Exam  Nursing note and vitals reviewed. Constitutional: He is oriented to person, place, and time. He appears well-developed and well-nourished. No distress.  HENT:  Head: Normocephalic and atraumatic.  Eyes: Conjunctivae and EOM are normal.  Neck: Neck supple. No tracheal deviation present.  Cardiovascular: Normal rate and regular rhythm.   Pulmonary/Chest: Effort normal and breath sounds normal. No respiratory distress.  Abdominal: Soft. There is no tenderness.  No tenderness to suprapubic region  Genitourinary: Penis normal.  right testicle was normal size, no masses, mildly tender to palpation, epidiymis was non-tender, left testicle was non-tender  Musculoskeletal: Normal range of motion.  Neurological: He is alert and oriented to person, place, and time.  Skin: Skin is warm and dry.  Psychiatric: He has a normal mood and affect. His behavior is normal.    ED Course  Procedures (including critical care time)  DIAGNOSTIC STUDIES: Oxygen Saturation is 99% on room air, normal by my interpretation.  COORDINATION OF CARE: 4:24 PM-Discussed treatment plan which includes [place] of scrotum and UA with pt at bedside and pt agreed to plan.   Labs Reviewed  URINALYSIS, ROUTINE W REFLEX MICROSCOPIC    US Scrotum  06/11/2012  *RADIOLOGY REPORT*  Clinical Data:  Right testicular pain and rule out torsion.  SCROTAL ULTRASOUND DOPPLER ULTRASOUND OF THE TESTICLES  Technique: Complete ultrasound examination of the testicles, epididymis, and other scrotal structures was performed.  Color and spectral Doppler ultrasound were also utilized to evaluate blood flow to the testicles.  Comparison:  None  Findings:  Right testis:  Right testicle measures 3.9  x 2.2 x 3.1 cm. The right testicle is homogeneous without an intratesticular lesion. Small amount of fluid around the right testicle.  Left testis:  Left testicle measures 3.9 x 2.0 x 3.1 cm. Normal appearance of the left testicle.  Right epididymis:  Normal in size and appearance.  Left epididymis:  Normal in size and appearance.  Hydrocele:  Small right hydrocele  Varicocele:  Absent  Pulsed Doppler interrogation of both testes demonstrates low resistance flow bilaterally.  IMPRESSION: Normal scrotal ultrasound.  No evidence for torsion.  Small right hydrocele.   Original Report Authenticated By: Richarda Overlie, M.D.    [place] Art/ven Flow Abd Pelv Doppler  06/11/2012  *RADIOLOGY REPORT*  Clinical Data:  Right testicular pain and rule out torsion.  SCROTAL ULTRASOUND DOPPLER ULTRASOUND OF THE TESTICLES  Technique: Complete ultrasound examination of the testicles, epididymis, and other scrotal structures was performed.  Color and spectral Doppler ultrasound were also utilized to evaluate blood flow to the testicles.  Comparison:  None  Findings:  Right testis:  Right testicle measures 3.9 x 2.2 x 3.1 cm. The right testicle is homogeneous without an intratesticular lesion. Small amount of fluid around the right testicle.  Left testis:  Left testicle measures 3.9 x 2.0 x 3.1 cm. Normal appearance of the left testicle.  Right epididymis:  Normal in size and appearance.  Left epididymis:  Normal in size and appearance.  Hydrocele:  Small right hydrocele  Varicocele:  Absent  Pulsed Doppler interrogation of both testes demonstrates low resistance flow bilaterally.  IMPRESSION: Normal scrotal ultrasound.  No evidence for torsion.  Small right hydrocele.   Original Report Authenticated By: Richarda Overlie, M.D.    No results found.   No diagnosis found.    MDM  Urinalysis and Doppler study of testicle normal.  Rx ibuprofen or Tylenol for pain  I personally performed the services described in this documentation, which was  scribed in my presence. The recorded information has been reviewed and is accurate.        Donnetta Hutching, MD 06/11/12 [phone]

## 2012-07-15 ENCOUNTER — Emergency Department (HOSPITAL_COMMUNITY)
Admission: EM | Admit: 2012-07-15 | Discharge: 2012-07-15 | Disposition: A | Discharge status: Home / Self Care | Payer: Self-pay | Attending: Emergency Medicine | Admitting: Emergency Medicine

## 2012-07-15 ENCOUNTER — Encounter (HOSPITAL_COMMUNITY): Payer: Self-pay | Admitting: *Deleted

## 2012-07-15 DIAGNOSIS — Z87442 Personal history of urinary calculi: Secondary | ICD-10-CM | POA: Insufficient documentation

## 2012-07-15 DIAGNOSIS — F17200 Nicotine dependence, unspecified, uncomplicated: Secondary | ICD-10-CM | POA: Insufficient documentation

## 2012-07-15 DIAGNOSIS — N23 Unspecified renal colic: Secondary | ICD-10-CM | POA: Insufficient documentation

## 2012-07-15 DIAGNOSIS — R319 Hematuria, unspecified: Secondary | ICD-10-CM | POA: Insufficient documentation

## 2012-07-15 LAB — URINE MICROSCOPIC-ADD ON

## 2012-07-15 LAB — URINALYSIS, ROUTINE W REFLEX MICROSCOPIC
Ketones, ur: NEGATIVE mg/dL
Leukocytes, UA: NEGATIVE
Nitrite: NEGATIVE
Specific Gravity, Urine: >1.03 — ABNORMAL HIGH (ref 1.005–1.030)
pH: 5.5 (ref 5.0–8.0)

## 2012-07-15 MED ORDER — CEPHALEXIN 500 MG PO CAPS
1000.0000 mg | ORAL_CAPSULE | Freq: Once | ORAL | Status: AC
  Administered 2012-07-15: 1000 mg via ORAL
  Filled 2012-07-15: qty 2

## 2012-07-15 MED ORDER — ONDANSETRON 8 MG PO TBDP: ORAL_TABLET | ORAL | Status: DC

## 2012-07-15 MED ORDER — HYDROMORPHONE HCL PF 1 MG/ML IJ SOLN
1.0000 mg | Freq: Once | INTRAMUSCULAR | Status: AC
  Administered 2012-07-15: 1 mg via INTRAVENOUS
  Filled 2012-07-15: qty 1

## 2012-07-15 MED ORDER — KETOROLAC TROMETHAMINE 30 MG/ML IJ SOLN
30.0000 mg | Freq: Once | INTRAMUSCULAR | Status: AC
  Administered 2012-07-15: 30 mg via INTRAVENOUS
  Filled 2012-07-15: qty 1

## 2012-07-15 MED ORDER — CEPHALEXIN 500 MG PO CAPS: ORAL_CAPSULE | ORAL | Status: DC

## 2012-07-15 MED ORDER — METOCLOPRAMIDE HCL 10 MG PO TABS: 10.0000 mg | ORAL_TABLET | Freq: Four times a day (QID) | ORAL | Status: DC | PRN

## 2012-07-15 MED ORDER — OXYCODONE-ACETAMINOPHEN 5-325 MG PO TABS: 2.0000 | ORAL_TABLET | ORAL | Status: DC | PRN

## 2012-07-15 NOTE — ED Notes (Signed)
Pt states left side flank pain that started today, pt says looks like blood in his urine that started today.

## 2012-07-15 NOTE — ED Provider Notes (Signed)
History     CSN: [id number]  Arrival date & time 07/15/12  0038   First MD Initiated Contact with Patient 07/15/12 0220      Chief Complaint  Patient presents with  . Flank Pain    (Consider location/radiation/quality/duration/timing/severity/associated sxs/prior treatment) HPI 27 year old male has several hours of acute left flank pain colicky severe just like prior kidney stones he does have gross hematuria tonight he does not have nausea or vomiting this time is left flank pain and left-sided abdominal pain no testicular pain no dysuria no chest pain cough shortness of breath no trauma no midline back pain no pain in his legs no weakness or numbness no treatment prior to arrival. He has had multiple CT scans in the past with documented kidney stones. Past Medical History  Diagnosis Date  . Renal disorder   . Kidney stones     History reviewed. No pertinent past surgical history.  Family History  Problem Relation Age of Onset  . Diabetes Father     History  Substance Use Topics  . Smoking status: Current Every Day Smoker -- 1.00 packs/day    Types: Cigarettes  . Smokeless tobacco: Not on file  . Alcohol Use: No      Review of Systems 10 Systems reviewed and are negative for acute change except as noted in the HPI. Allergies  Review of patient's allergies indicates no known allergies.  Home Medications   Current Outpatient Rx  Name  Route  Sig  Dispense  Refill  . cephALEXin (KEFLEX) 500 MG capsule      2 caps po bid x 7 days   28 capsule   0   . metoCLOPramide (REGLAN) 10 MG tablet   Oral   Take 1 tablet (10 mg total) by mouth every 6 (six) hours as needed (nausea/headache).   6 tablet   0   . ondansetron (ZOFRAN ODT) 8 MG disintegrating tablet      8mg  ODT q4 hours prn nausea   4 tablet   0   . oxyCODONE-acetaminophen (PERCOCET) 5-325 MG per tablet   Oral   Take 2 tablets by mouth every 4 (four) hours as needed for pain.   20 tablet   0      BP 120/66  Pulse 80  Temp(Src) 98.2 F (36.8 C) (Oral)  Resp 16  SpO2 98%  Physical Exam  Nursing note and vitals reviewed. Constitutional:  Awake, alert, nontoxic appearance.  HENT:  Head: Atraumatic.  Eyes: Right eye exhibits no discharge. Left eye exhibits no discharge.  Neck: Neck supple.  Cardiovascular: Normal rate and regular rhythm.   No murmur heard. Pulmonary/Chest: Effort normal and breath sounds normal. No respiratory distress. He has no wheezes. He has no rales. He exhibits no tenderness.  Abdominal: Soft. Bowel sounds are normal. He exhibits no distension and no mass. There is tenderness. There is no rebound and no guarding.  Minimal left upper and lower abdominal tenderness right abdomen is nontender no peritoneal signs no rebound positive left CVA tenderness no midline back tenderness no right-sided CVA tenderness scrotum is nontender  Musculoskeletal: He exhibits no tenderness.  Baseline ROM, no obvious new focal weakness.  Neurological:  Mental status and motor strength appears baseline for patient and situation.  Skin: No rash noted.  Psychiatric: He has a normal mood and affect.    ED Course  Procedures (including critical care time) Do not feel CT mandated; will start antibiotics since bacteriuria present. Labs Reviewed  URINALYSIS,  ROUTINE W REFLEX MICROSCOPIC - Abnormal; Notable for the following:    Color, Urine BROWN (*)    APPearance HAZY (*)    Specific Gravity, Urine >1.030 (*)    Hgb urine dipstick LARGE (*)    Bilirubin Urine SMALL (*)    Protein, ur 30 (*)    All other components within normal limits  URINE MICROSCOPIC-ADD ON - Abnormal; Notable for the following:    Bacteria, UA MANY (*)    Crystals CA OXALATE CRYSTALS (*)    All other components within normal limits   No results found.   1. Renal colic on left side   2. Hematuria       MDM  I doubt any other EMC precluding discharge at this time including, but not  necessarily limited to the following:sepsis.        Hurman Horn, MD 07/17/12 [phone]

## 2012-07-15 NOTE — ED Notes (Signed)
Pt alert & oriented x4, stable gait. Patient given discharge instructions, paperwork & prescription(s). Patient  instructed to stop at the registration desk to finish any additional paperwork. Patient verbalized understanding. Pt left department w/ no further questions. 

## 2012-07-25 ENCOUNTER — Encounter (HOSPITAL_COMMUNITY): Payer: Self-pay | Admitting: *Deleted

## 2012-07-25 ENCOUNTER — Emergency Department (HOSPITAL_COMMUNITY)
Admission: EM | Admit: 2012-07-25 | Discharge: 2012-07-25 | Disposition: A | Discharge status: Home / Self Care | Payer: Self-pay | Attending: Emergency Medicine | Admitting: Emergency Medicine

## 2012-07-25 ENCOUNTER — Emergency Department (HOSPITAL_COMMUNITY): Payer: Self-pay

## 2012-07-25 DIAGNOSIS — F17200 Nicotine dependence, unspecified, uncomplicated: Secondary | ICD-10-CM | POA: Insufficient documentation

## 2012-07-25 DIAGNOSIS — D72829 Elevated white blood cell count, unspecified: Secondary | ICD-10-CM | POA: Insufficient documentation

## 2012-07-25 DIAGNOSIS — R109 Unspecified abdominal pain: Secondary | ICD-10-CM

## 2012-07-25 DIAGNOSIS — N200 Calculus of kidney: Secondary | ICD-10-CM | POA: Insufficient documentation

## 2012-07-25 LAB — CBC WITH DIFFERENTIAL/PLATELET
Basophils Absolute: 0.1 10*3/uL (ref 0.0–0.1)
Eosinophils Absolute: 0.4 10*3/uL (ref 0.0–0.7)
Eosinophils Relative: 3 % (ref 0–5)
HCT: 47.1 % (ref 39.0–52.0)
Lymphocytes Relative: 15 % (ref 12–46)
MCH: 30.9 pg (ref 26.0–34.0)
MCHC: 34.4 g/dL (ref 30.0–36.0)
MCV: 89.9 fL (ref 78.0–100.0)
Monocytes Absolute: 1 10*3/uL (ref 0.1–1.0)
RDW: 13.3 % (ref 11.5–15.5)
WBC: 14.2 10*3/uL — ABNORMAL HIGH (ref 4.0–10.5)

## 2012-07-25 LAB — BASIC METABOLIC PANEL
BUN: 13 mg/dL (ref 6–23)
CO2: 26 mEq/L (ref 19–32)
Calcium: 9.7 mg/dL (ref 8.4–10.5)
Chloride: 103 mEq/L (ref 96–112)
Creatinine, Ser: 0.74 mg/dL (ref 0.50–1.35)
Glucose, Bld: 127 mg/dL — ABNORMAL HIGH (ref 70–99)

## 2012-07-25 LAB — URINALYSIS, ROUTINE W REFLEX MICROSCOPIC
Leukocytes, UA: NEGATIVE
Nitrite: NEGATIVE
Specific Gravity, Urine: >1.03 — ABNORMAL HIGH (ref 1.005–1.030)
Urobilinogen, UA: 0.2 mg/dL (ref 0.0–1.0)
pH: 6 (ref 5.0–8.0)

## 2012-07-25 MED ORDER — NAPROXEN 500 MG PO TABS: 500.0000 mg | ORAL_TABLET | Freq: Two times a day (BID) | ORAL | Status: DC

## 2012-07-25 MED ORDER — KETOROLAC TROMETHAMINE 30 MG/ML IJ SOLN
30.0000 mg | Freq: Once | INTRAMUSCULAR | Status: AC
  Administered 2012-07-25: 30 mg via INTRAVENOUS
  Filled 2012-07-25: qty 1

## 2012-07-25 MED ORDER — HYDROMORPHONE HCL PF 1 MG/ML IJ SOLN
1.0000 mg | Freq: Once | INTRAMUSCULAR | Status: AC
  Administered 2012-07-25: 1 mg via INTRAVENOUS
  Filled 2012-07-25: qty 1

## 2012-07-25 NOTE — ED Provider Notes (Signed)
History  This chart was scribed for Vida Roller, MD by Manuela Schwartz, ED scribe. This patient was seen in room APA03/APA03 and the patient's care was started at 1653.   CSN: [id number]  Arrival date & time 07/25/12  1653   First MD Initiated Contact with Patient 07/25/12 1720      Chief Complaint  Patient presents with  . Nephrolithiasis   Patient is a 27 y.o. male presenting with flank pain. The history is provided by the patient. No language interpreter was used.  Flank Pain This is a recurrent problem. The current episode started 6 to 12 hours ago. The problem occurs constantly. The problem has not changed since onset.Pertinent negatives include no chest pain, no abdominal pain and no shortness of breath. Nothing aggravates the symptoms. Nothing relieves the symptoms. Treatments tried: antibiotics and pain medicine. The treatment provided no relief.   HPI Comments: Dale Martinez is a 27 y.o. male who presents to the Emergency Department complaining of right sided flank/groin pain since this AM which he feels is similar to episode when he was here 10 days ago and d/c w/keflex/pain medicine for possible UTI and now his pain returned this AM. He denies nausea, emesis, diarrhea, hematuria, dysuria. Nothing makes his pain better or worse and he states trouble finding a comfortable position to sit.     Past Medical History  Diagnosis Date  . Renal disorder   . Kidney stones     History reviewed. No pertinent past surgical history.  Family History  Problem Relation Age of Onset  . Diabetes Father     History  Substance Use Topics  . Smoking status: Current Every Day Smoker -- 1.00 packs/day    Types: Cigarettes  . Smokeless tobacco: Not on file  . Alcohol Use: No      Review of Systems  Constitutional: Negative for fever and chills.  Respiratory: Negative for shortness of breath.   Cardiovascular: Negative for chest pain.  Gastrointestinal: Negative for nausea, vomiting  and abdominal pain.  Genitourinary: Positive for flank pain (right flank/groin pain).  Skin: Negative for pallor.  Neurological: Negative for weakness.  All other systems reviewed and are negative.  A complete 10 system review of systems was obtained and all systems are negative except as noted in the HPI and PMH.    Allergies  Review of patient's allergies indicates no known allergies.  Home Medications   Current Outpatient Rx  Name  Route  Sig  Dispense  Refill  . cephALEXin (KEFLEX) 500 MG capsule   Oral   Take 1,000 mg by mouth 2 (two) times daily. 2 caps po bid x 7 days         . naproxen (NAPROSYN) 500 MG tablet   Oral   Take 1 tablet (500 mg total) by mouth 2 (two) times daily with a meal.   30 tablet   0     Triage Vitals: BP 142/86  Pulse 103  Temp(Src) 98.4 F (36.9 C) (Oral)  Resp 21  Ht 5\' 11"  (1.803 m)  Wt 205 lb (92.987 kg)  BMI 28.6 kg/m2  SpO2 99%  Physical Exam  Nursing note and vitals reviewed. Constitutional: He is oriented to person, place, and time. He appears well-developed and well-nourished. No distress.  HENT:  Head: Normocephalic and atraumatic.  Dental carries  Eyes: EOM are normal.  Neck: Neck supple. No tracheal deviation present.  Cardiovascular: Normal rate, regular rhythm and normal heart sounds.   No  murmur heard. Pulmonary/Chest: Effort normal and breath sounds normal. No respiratory distress. He has no wheezes.  Abdominal: Soft. Bowel sounds are normal. He exhibits no distension. There is no tenderness.  Genitourinary:  Normal appearing scrotum/testicles  Musculoskeletal: Normal range of motion. He exhibits no tenderness.  No CVA tenderness  Neurological: He is alert and oriented to person, place, and time.  Skin: Skin is warm and dry.  Psychiatric: He has a normal mood and affect. His behavior is normal.    ED Course  Procedures (including critical care time) DIAGNOSTIC STUDIES: Oxygen Saturation is 99% on room air,  normal by my interpretation.    COORDINATION OF CARE: At 535 PM Discussed treatment plan with patient which includes abdominal CT, discussed risks of radiation CT. Patient agrees.   Labs Reviewed  URINALYSIS, ROUTINE W REFLEX MICROSCOPIC - Abnormal; Notable for the following:    Specific Gravity, Urine >1.030 (*)    Hgb urine dipstick MODERATE (*)    All other components within normal limits  BASIC METABOLIC PANEL - Abnormal; Notable for the following:    Glucose, Bld 127 (*)    All other components within normal limits  CBC WITH DIFFERENTIAL - Abnormal; Notable for the following:    WBC 14.2 (*)    Neutro Abs 10.6 (*)    All other components within normal limits  URINE CULTURE  URINE MICROSCOPIC-ADD ON   Ct Abdomen Pelvis Wo Contrast  07/25/2012   *RADIOLOGY REPORT*  Clinical Data: History of renal stones, now with right-sided flank pain  CT ABDOMEN AND PELVIS WITHOUT CONTRAST  Technique:  Multidetector CT imaging of the abdomen and pelvis was performed following the standard protocol without intravenous contrast.  Comparison: 12/14/2011  Findings:  The lack of intravenous contrast limits the ability to evaluate solid abdominal organs.  There is a punctate (approximately 3 mm) nonobstructing stone within the superior aspect of the left ureter/ left UPJ (axial image 42, series 2, coronal image 53) without significant upstream ureterectasis or pelvicaliectasis.  There are additional punctate (<3 mm) nonobstructing stones within the superior (images 33 and 35) and mid aspects (image 39) of the left kidney.  No definite right-sided renal stones.  No stones are seen along the expected course of the right ureter.  Normal noncontrast appearance of the urinary bladder given degree distension.  Prostatic calcifications.  No free fluid in the pelvis.  Normal hepatic contour.  The gallbladder is distended but otherwise normal on this noncontrast examination.  No ascites.  Normal noncontrast appearance of  the bilateral adrenal glands, pancreas and spleen.  Scattered colonic diverticulosis without evidence of diverticulitis.  The bowel is otherwise normal in course and caliber without wall thickening or evidence of obstruction.  No pneumoperitoneum, pneumatosis or portal venous gas.  Normal caliber of the abdominal aorta. Shoddy mesenteric lymph nodes within the right lower abdominal quadrant are not enlarged by CT criteria with index mesenteric node measuring 7 mm in short axis diameter (image 54, series 2).  No retroperitoneal, mesenteric, pelvic or inguinal lymphadenopathy.  Limited visualization of the lower thorax is negative for focal airspace opacity or pleural effusion.  Normal heart size.  No pericardial effusion.  No acute or aggressive osseous abnormalities.  IMPRESSION: 1.  There is a punctate (approximately 3 mm) nonobstructing stone within the superior aspect of the left ureter/left-sided UPJ. 2.  Additional nonobstructing left-sided nephrolithiasis as above. 3.  No evidence of right-sided nephrolithiasis.  No right-sided urinary obstruction. 4.  Colonic diverticulosis without evidence of  diverticulitis.   Original Report Authenticated By: Tacey Ruiz, MD     1. Flank pain       MDM  Pt is benign in appearance - has normal VS, CT shows one non obstructing stone in the L kidney - no other sources of pt's pain, has slight leukocytosis but no reproducible pain on palpation of abd - doubt appendicitis at this time - will need repeat eval for the hematuria - urine cultured.  I personally performed the services described in this documentation, which was scribed in my presence. The recorded information has been reviewed and is accurate.      Meds given in ED:  Medications  ketorolac (TORADOL) 30 MG/ML injection 30 mg (30 mg Intravenous Given 07/25/12 1754)  HYDROmorphone (DILAUDID) injection 1 mg (1 mg Intravenous Given 07/25/12 1846)    New Prescriptions   NAPROXEN (NAPROSYN) 500 MG  TABLET    Take 1 tablet (500 mg total) by mouth 2 (two) times daily with a meal.           Vida Roller, MD 07/25/12 [phone]

## 2012-07-25 NOTE — ED Notes (Signed)
Pt states he was seen here 2 weeks ago for kidney stone. States he never passed the stone. States nausea earlier today and pain to right flank and groin area.

## 2012-07-25 NOTE — ED Notes (Signed)
Pt requesting pain med. edp aware 

## 2012-07-27 LAB — URINE CULTURE

## 2012-08-11 ENCOUNTER — Emergency Department (HOSPITAL_COMMUNITY)
Admission: EM | Admit: 2012-08-11 | Discharge: 2012-08-11 | Disposition: A | Discharge status: Home / Self Care | Payer: Self-pay | Attending: Emergency Medicine | Admitting: Emergency Medicine

## 2012-08-11 ENCOUNTER — Encounter (HOSPITAL_COMMUNITY): Payer: Self-pay | Admitting: Emergency Medicine

## 2012-08-11 DIAGNOSIS — R109 Unspecified abdominal pain: Secondary | ICD-10-CM | POA: Insufficient documentation

## 2012-08-11 DIAGNOSIS — F17200 Nicotine dependence, unspecified, uncomplicated: Secondary | ICD-10-CM | POA: Insufficient documentation

## 2012-08-11 DIAGNOSIS — R3915 Urgency of urination: Secondary | ICD-10-CM | POA: Insufficient documentation

## 2012-08-11 DIAGNOSIS — R319 Hematuria, unspecified: Secondary | ICD-10-CM | POA: Insufficient documentation

## 2012-08-11 DIAGNOSIS — Z87442 Personal history of urinary calculi: Secondary | ICD-10-CM | POA: Insufficient documentation

## 2012-08-11 LAB — URINALYSIS, ROUTINE W REFLEX MICROSCOPIC
Bilirubin Urine: NEGATIVE
Glucose, UA: NEGATIVE mg/dL
Protein, ur: 30 mg/dL — AB
Specific Gravity, Urine: >1.03 — ABNORMAL HIGH (ref 1.005–1.030)

## 2012-08-11 LAB — URINE MICROSCOPIC-ADD ON

## 2012-08-11 MED ORDER — IBUPROFEN 800 MG PO TABS
800.0000 mg | ORAL_TABLET | Freq: Once | ORAL | Status: AC
  Administered 2012-08-11: 800 mg via ORAL
  Filled 2012-08-11: qty 1

## 2012-08-11 MED ORDER — OXYCODONE-ACETAMINOPHEN 5-325 MG PO TABS
2.0000 | ORAL_TABLET | Freq: Once | ORAL | Status: AC
  Administered 2012-08-11: 2 via ORAL
  Filled 2012-08-11: qty 2

## 2012-08-11 NOTE — ED Provider Notes (Signed)
   History    CSN: [id number] Arrival date & time 08/11/12  0406  First MD Initiated Contact with Patient 08/11/12 [phone]     Chief Complaint  Patient presents with  . Flank Pain   Patient is a 27 y.o. male presenting with flank pain. The history is provided by the patient.  Flank Pain This is a recurrent problem. The current episode started 6 to 12 hours ago. The problem occurs constantly. The problem has not changed since onset.Pertinent negatives include no chest pain, no abdominal pain and no shortness of breath. Exacerbated by: certain positions. Nothing relieves the symptoms. He has tried rest for the symptoms. The treatment provided no relief.   Past Medical History  Diagnosis Date  . Renal disorder   . Kidney stones    History reviewed. No pertinent past surgical history. Family History  Problem Relation Age of Onset  . Diabetes Father    History  Substance Use Topics  . Smoking status: Current Every Day Smoker -- 1.00 packs/day    Types: Cigarettes  . Smokeless tobacco: Not on file  . Alcohol Use: No    Review of Systems  Constitutional: Negative for fever.  Respiratory: Negative for shortness of breath.   Cardiovascular: Negative for chest pain.  Gastrointestinal: Negative for vomiting and abdominal pain.  Genitourinary: Positive for urgency and flank pain. Negative for testicular pain.  Musculoskeletal: Negative for back pain.  Neurological: Negative for weakness.  All other systems reviewed and are negative.    Allergies  Review of patient's allergies indicates no known allergies.  Home Medications  No current outpatient prescriptions on file. BP 128/93  Pulse 67  Temp(Src) 97.6 F (36.4 C) (Oral)  Resp 20  Ht 5\' 11"  (1.803 m)  Wt 205 lb (92.987 kg)  BMI 28.6 kg/m2  SpO2 96% Physical Exam CONSTITUTIONAL: Well developed/well nourished HEAD: Normocephalic/atraumatic EYES: EOMI/PERRL ENMT: Mucous membranes moist NECK: supple no meningeal  signs SPINE:entire spine nontender CV: S1/S2 noted, no murmurs/rubs/gallops noted LUNGS: Lungs are clear to auscultation bilaterally, no apparent distress ABDOMEN: soft, nontender, no rebound or guarding JX:BJYN cva tenderness NEURO: Pt is awake/alert, moves all extremitiesx4 EXTREMITIES: pulses normal, full ROM SKIN: warm, color normal PSYCH: no abnormalities of mood noted  ED Course  Procedures  Labs Reviewed  URINALYSIS, ROUTINE W REFLEX MICROSCOPIC - Abnormal; Notable for the following:    Color, Urine AMBER (*)    APPearance HAZY (*)    Specific Gravity, Urine >1.030 (*)    Hgb urine dipstick LARGE (*)    Protein, ur 30 (*)    All other components within normal limits  URINE MICROSCOPIC-ADD ON    1. Hematuria   2. Flank pain     MDM  Nursing notes including past medical history and social history reviewed and considered in documentation Labs/vital reviewed and considered Previous records reviewed and considered - recent ED visits reviewed Narcotic database reviewed   Pt is well appearing.  He was watching TV when I entered.  He had left CVA tenderness but otherwise exam was unremarkable.  He did have hematuria.  He has had multiple ED workups including recent CT imaging. Given his clinical appearance/history, will defer further workup.  I stressed importance of urology followup as outpatient Pt reports he has ride home Stable for d/c  Joya Gaskins, MD 08/11/12 [phone]

## 2012-08-11 NOTE — ED Notes (Signed)
Pt c/o left flank pain since roughly 2200 last night. Report hx of kidney stones. C/o urinary frequency and dysuria.

## 2012-11-28 ENCOUNTER — Encounter (HOSPITAL_COMMUNITY): Payer: Self-pay | Admitting: Emergency Medicine

## 2012-11-28 ENCOUNTER — Emergency Department (HOSPITAL_COMMUNITY)
Admission: EM | Admit: 2012-11-28 | Discharge: 2012-11-28 | Disposition: A | Discharge status: Home / Self Care | Payer: Self-pay | Attending: Emergency Medicine | Admitting: Emergency Medicine

## 2012-11-28 DIAGNOSIS — R6883 Chills (without fever): Secondary | ICD-10-CM | POA: Insufficient documentation

## 2012-11-28 DIAGNOSIS — J029 Acute pharyngitis, unspecified: Secondary | ICD-10-CM | POA: Insufficient documentation

## 2012-11-28 DIAGNOSIS — Z8619 Personal history of other infectious and parasitic diseases: Secondary | ICD-10-CM | POA: Insufficient documentation

## 2012-11-28 DIAGNOSIS — Z87442 Personal history of urinary calculi: Secondary | ICD-10-CM | POA: Insufficient documentation

## 2012-11-28 DIAGNOSIS — F17200 Nicotine dependence, unspecified, uncomplicated: Secondary | ICD-10-CM | POA: Insufficient documentation

## 2012-11-28 DIAGNOSIS — J3489 Other specified disorders of nose and nasal sinuses: Secondary | ICD-10-CM | POA: Insufficient documentation

## 2012-11-28 HISTORY — DX: Streptococcal pharyngitis: J02.0

## 2012-11-28 MED ORDER — PENICILLIN V POTASSIUM 250 MG PO TABS: 250.0000 mg | ORAL_TABLET | Freq: Four times a day (QID) | ORAL | Status: AC

## 2012-11-28 NOTE — ED Provider Notes (Signed)
Medical screening examination/treatment/procedure(s) were performed by non-physician practitioner and as supervising physician I was immediately available for consultation/collaboration.  Imanol Bihl, MD 11/28/12 2318 

## 2012-11-28 NOTE — ED Notes (Signed)
Sore throat x 3 days.  Noticed white spot in back of throat today.  Denies fever.  Tonsils enlarged and red.  No obvious exudate noted.

## 2012-11-28 NOTE — ED Provider Notes (Signed)
CSN: [id number]     Arrival date & time 11/28/12  1735 History   First MD Initiated Contact with Patient 11/28/12 1855     Chief Complaint  Patient presents with  . Sore Throat   (Consider location/radiation/quality/duration/timing/severity/associated sxs/prior Treatment) Patient is a 27 y.o. male presenting with pharyngitis. The history is provided by the patient.  Sore Throat This is a new problem. The current episode started in the past 7 days. The problem occurs constantly. The problem has been gradually worsening. Associated symptoms include chills, congestion, a sore throat and swollen glands. Pertinent negatives include no abdominal pain, coughing, headaches, rash or vomiting. Fever: ? The symptoms are aggravated by eating and smoking. He has tried nothing for the symptoms.    Past Medical History  Diagnosis Date  . Renal disorder   . Kidney stones   . Strep throat    History reviewed. No pertinent past surgical history. Family History  Problem Relation Age of Onset  . Diabetes Father    History  Substance Use Topics  . Smoking status: Current Every Day Smoker -- 1.00 packs/day    Types: Cigarettes  . Smokeless tobacco: Not on file  . Alcohol Use: No    Review of Systems  Constitutional: Positive for chills. Fever: ?  HENT: Positive for congestion and sore throat. Negative for facial swelling.   Eyes: Negative for redness, itching and visual disturbance.  Respiratory: Negative for cough.   Gastrointestinal: Negative for vomiting and abdominal pain.  Musculoskeletal: Negative for back pain.  Skin: Negative for rash.  Neurological: Negative for dizziness and headaches.  Psychiatric/Behavioral: The patient is not nervous/anxious.     Allergies  Review of patient's allergies indicates no known allergies.  Home Medications  No current outpatient prescriptions on file. BP 123/68  Pulse 91  Temp(Src) 98.3 F (36.8 C) (Oral)  Resp 16  Ht 5\' 11"  (1.803 m)  Wt 200  lb (90.719 kg)  BMI 27.91 kg/m2  SpO2 98% Physical Exam  Nursing note and vitals reviewed. Constitutional: He is oriented to person, place, and time. He appears well-developed and well-nourished. No distress.  HENT:  Head: Normocephalic and atraumatic.  Right Ear: External ear normal.  Left Ear: External ear normal.  Mouth/Throat: Uvula is midline and mucous membranes are normal. Posterior oropharyngeal erythema present.  Cerumen bilateral ear canals.  Eyes: EOM are normal.  Neck: Neck supple.  Cardiovascular: Normal rate, regular rhythm and normal heart sounds.   Pulmonary/Chest: Effort normal. He has wheezes (occasional).  Musculoskeletal: Normal range of motion.  Lymphadenopathy:    He has cervical adenopathy.  Neurological: He is alert and oriented to person, place, and time. No cranial nerve deficit.  Skin: Skin is warm and dry.  Psychiatric: He has a normal mood and affect. His behavior is normal.    ED Course  Procedures  MDM  27 y.o. male with pharyngitis, and swollen cervical lymph nodes. Will treat with penicillin. Patient will use ibuprofen for discomfort and fever and decongestants as needed.  Discussed with the patient and all questioned fully answered. He will return if any problems arise.    Medication List         penicillin v potassium 250 MG tablet  Commonly known as:  VEETID  Take 1 tablet (250 mg total) by mouth 4 (four) times daily.           [place], [place] 11/28/12 2109

## 2012-12-11 ENCOUNTER — Encounter (HOSPITAL_COMMUNITY): Payer: Self-pay | Admitting: Emergency Medicine

## 2012-12-11 ENCOUNTER — Emergency Department (HOSPITAL_COMMUNITY)
Admission: EM | Admit: 2012-12-11 | Discharge: 2012-12-12 | Disposition: A | Discharge status: Home / Self Care | Payer: Self-pay | Attending: Emergency Medicine | Admitting: Emergency Medicine

## 2012-12-11 DIAGNOSIS — R109 Unspecified abdominal pain: Secondary | ICD-10-CM | POA: Insufficient documentation

## 2012-12-11 DIAGNOSIS — Z791 Long term (current) use of non-steroidal anti-inflammatories (NSAID): Secondary | ICD-10-CM | POA: Insufficient documentation

## 2012-12-11 DIAGNOSIS — Z87442 Personal history of urinary calculi: Secondary | ICD-10-CM | POA: Insufficient documentation

## 2012-12-11 DIAGNOSIS — R110 Nausea: Secondary | ICD-10-CM | POA: Insufficient documentation

## 2012-12-11 DIAGNOSIS — Z79899 Other long term (current) drug therapy: Secondary | ICD-10-CM | POA: Insufficient documentation

## 2012-12-11 DIAGNOSIS — Z87448 Personal history of other diseases of urinary system: Secondary | ICD-10-CM | POA: Insufficient documentation

## 2012-12-11 DIAGNOSIS — F17200 Nicotine dependence, unspecified, uncomplicated: Secondary | ICD-10-CM | POA: Insufficient documentation

## 2012-12-11 DIAGNOSIS — Z8619 Personal history of other infectious and parasitic diseases: Secondary | ICD-10-CM | POA: Insufficient documentation

## 2012-12-11 LAB — URINALYSIS, ROUTINE W REFLEX MICROSCOPIC
Glucose, UA: NEGATIVE mg/dL
Leukocytes, UA: NEGATIVE
Nitrite: NEGATIVE
Specific Gravity, Urine: 1.02 (ref 1.005–1.030)
pH: 7.5 (ref 5.0–8.0)

## 2012-12-11 MED ORDER — KETOROLAC TROMETHAMINE 60 MG/2ML IM SOLN
60.0000 mg | Freq: Once | INTRAMUSCULAR | Status: AC
  Administered 2012-12-11: 60 mg via INTRAMUSCULAR
  Filled 2012-12-11: qty 2

## 2012-12-11 NOTE — ED Notes (Signed)
Patient states that his pain has not improved at this time.

## 2012-12-11 NOTE — ED Provider Notes (Signed)
CSN: [id number]     Arrival date & time 12/11/12  2259 History   First MD Initiated Contact with Patient 12/11/12 2307     Chief Complaint  Patient presents with  . Flank Pain   (Consider location/radiation/quality/duration/timing/severity/associated sxs/prior Treatment) HPI Comments: Pt is a 27 y/o male with frequent kidney stones (visits several times a year) who presents with recurrent L sided abd pain - this is sharp and stabbing, and radiates to the L flank and the L groin - there has been some dark urine.  There is nausea yesterday but not today.  No fevers, chills or swellign in the groin.  No dysuria, no bowel complaints.  States this feels similar to prior kidney stones.  Patient is a 27 y.o. male presenting with flank pain. The history is provided by the patient and medical records.  Flank Pain    Past Medical History  Diagnosis Date  . Renal disorder   . Kidney stones   . Strep throat    History reviewed. No pertinent past surgical history. Family History  Problem Relation Age of Onset  . Diabetes Father    History  Substance Use Topics  . Smoking status: Current Every Day Smoker -- 1.00 packs/day    Types: Cigarettes  . Smokeless tobacco: Not on file  . Alcohol Use: No    Review of Systems  Genitourinary: Positive for flank pain.  All other systems reviewed and are negative.    Allergies  Review of patient's allergies indicates no known allergies.  Home Medications   Current Outpatient Rx  Name  Route  Sig  Dispense  Refill  . HYDROcodone-acetaminophen (NORCO/VICODIN) 5-325 MG per tablet   Oral   Take 2 tablets by mouth every 4 (four) hours as needed for pain.   6 tablet   0   . naproxen (NAPROSYN) 500 MG tablet   Oral   Take 1 tablet (500 mg total) by mouth 2 (two) times daily with a meal.   30 tablet   0   . tamsulosin (FLOMAX) 0.4 MG CAPS capsule   Oral   Take 1 capsule (0.4 mg total) by mouth 2 (two) times daily.   10 capsule   0    BP  134/85  Pulse 100  Temp(Src) 98 F (36.7 C) (Oral)  Ht 5\' 11"  (1.803 m)  Wt 205 lb (92.987 kg)  BMI 28.60 kg/m2  SpO2 97% Physical Exam  Nursing note and vitals reviewed. Constitutional: He appears well-developed and well-nourished. No distress.  HENT:  Head: Normocephalic and atraumatic.  Mouth/Throat: Oropharynx is clear and moist. No oropharyngeal exudate.  Eyes: Conjunctivae and EOM are normal. Pupils are equal, round, and reactive to light. Right eye exhibits no discharge. Left eye exhibits no discharge. No scleral icterus.  Neck: Normal range of motion. Neck supple. No JVD present. No thyromegaly present.  Cardiovascular: Normal rate, regular rhythm, normal heart sounds and intact distal pulses.  Exam reveals no gallop and no friction rub.   No murmur heard. Pulmonary/Chest: Effort normal and breath sounds normal. No respiratory distress. He has no wheezes. He has no rales.  Abdominal: Soft. Bowel sounds are normal. He exhibits no distension and no mass. There is no tenderness.  Mild L CVA ttp.  Musculoskeletal: Normal range of motion. He exhibits no edema and no tenderness.  Lymphadenopathy:    He has no cervical adenopathy.  Neurological: He is alert. Coordination normal.  Skin: Skin is warm and dry. No rash noted. No  erythema.  Psychiatric: He has a normal mood and affect. His behavior is normal.    ED Course  Procedures (including critical care time) Labs Review Labs Reviewed  URINALYSIS, ROUTINE W REFLEX MICROSCOPIC   Imaging Review No results found.  EKG Interpretation   None       MDM   1. Abdominal pain   2. Flank pain    The pt has reproducible ttp in the CVA on the L - c/w KS, check UA, toradol.  I have reviewed the prior CT scans and find multiple stones in the past - he has always passed without assistance.  No overt signs of infection.  UA, Meds.  No blood in urine - pt does not have classic colicky appearance - no signs of infection - pt given  Toradol - immediately complained and asked for stronger meds, stable for d/c - Uro f/u as outpt.  Meds given in ED:  Medications  ketorolac (TORADOL) injection 60 mg (60 mg Intramuscular Given 12/11/12 2334)    Discharge Medication List as of 12/12/2012 12:03 AM    START taking these medications   Details  HYDROcodone-acetaminophen (NORCO/VICODIN) 5-325 MG per tablet Take 2 tablets by mouth every 4 (four) hours as needed for pain., Starting 12/12/2012, Until Discontinued, Print    naproxen (NAPROSYN) 500 MG tablet Take 1 tablet (500 mg total) by mouth 2 (two) times daily with a meal., Starting 12/12/2012, Until Discontinued, Print    tamsulosin (FLOMAX) 0.4 MG CAPS capsule Take 1 capsule (0.4 mg total) by mouth 2 (two) times daily., Starting 12/12/2012, Until Discontinued, Print            Vida Roller, MD 12/12/12 [phone]

## 2012-12-11 NOTE — ED Notes (Signed)
Patient complaining of left lower back pain and left groin pain. Reports started 2 days ago. States history of kidney stones.

## 2012-12-12 MED ORDER — HYDROCODONE-ACETAMINOPHEN 5-325 MG PO TABS: 2.0000 | ORAL_TABLET | ORAL | Status: DC | PRN

## 2012-12-12 MED ORDER — TAMSULOSIN HCL 0.4 MG PO CAPS: 0.4000 mg | ORAL_CAPSULE | Freq: Two times a day (BID) | ORAL | Status: DC

## 2012-12-12 MED ORDER — NAPROXEN 500 MG PO TABS: 500.0000 mg | ORAL_TABLET | Freq: Two times a day (BID) | ORAL | Status: DC

## 2012-12-12 MED FILL — Hydrocodone-Acetaminophen Tab 5-325 MG: ORAL | Qty: 6 | Status: AC

## 2013-01-24 ENCOUNTER — Encounter (HOSPITAL_COMMUNITY): Payer: Self-pay | Admitting: Emergency Medicine

## 2013-01-24 ENCOUNTER — Emergency Department (HOSPITAL_COMMUNITY)
Admission: EM | Admit: 2013-01-24 | Discharge: 2013-01-24 | Disposition: A | Discharge status: Home / Self Care | Payer: Self-pay | Attending: Emergency Medicine | Admitting: Emergency Medicine

## 2013-01-24 DIAGNOSIS — Z87448 Personal history of other diseases of urinary system: Secondary | ICD-10-CM | POA: Insufficient documentation

## 2013-01-24 DIAGNOSIS — F17200 Nicotine dependence, unspecified, uncomplicated: Secondary | ICD-10-CM | POA: Insufficient documentation

## 2013-01-24 DIAGNOSIS — R61 Generalized hyperhidrosis: Secondary | ICD-10-CM | POA: Insufficient documentation

## 2013-01-24 DIAGNOSIS — Z791 Long term (current) use of non-steroidal anti-inflammatories (NSAID): Secondary | ICD-10-CM | POA: Insufficient documentation

## 2013-01-24 DIAGNOSIS — Z79899 Other long term (current) drug therapy: Secondary | ICD-10-CM | POA: Insufficient documentation

## 2013-01-24 DIAGNOSIS — R110 Nausea: Secondary | ICD-10-CM | POA: Insufficient documentation

## 2013-01-24 DIAGNOSIS — Z8619 Personal history of other infectious and parasitic diseases: Secondary | ICD-10-CM | POA: Insufficient documentation

## 2013-01-24 DIAGNOSIS — R109 Unspecified abdominal pain: Secondary | ICD-10-CM | POA: Insufficient documentation

## 2013-01-24 DIAGNOSIS — Z87442 Personal history of urinary calculi: Secondary | ICD-10-CM | POA: Insufficient documentation

## 2013-01-24 LAB — URINALYSIS, ROUTINE W REFLEX MICROSCOPIC
Leukocytes, UA: NEGATIVE
Nitrite: NEGATIVE
Specific Gravity, Urine: 1.02 (ref 1.005–1.030)
Urobilinogen, UA: 0.2 mg/dL (ref 0.0–1.0)

## 2013-01-24 MED ORDER — KETOROLAC TROMETHAMINE 30 MG/ML IJ SOLN
30.0000 mg | Freq: Once | INTRAMUSCULAR | Status: AC
  Administered 2013-01-24: 30 mg via INTRAMUSCULAR
  Filled 2013-01-24: qty 1

## 2013-01-24 MED ORDER — HYDROCODONE-ACETAMINOPHEN 5-325 MG PO TABS: 1.0000 | ORAL_TABLET | Freq: Four times a day (QID) | ORAL | Status: DC | PRN

## 2013-01-24 MED ORDER — HYDROCODONE-ACETAMINOPHEN 5-325 MG PO TABS
1.0000 | ORAL_TABLET | Freq: Once | ORAL | Status: AC
  Administered 2013-01-24: 1 via ORAL
  Filled 2013-01-24: qty 1

## 2013-01-24 NOTE — ED Provider Notes (Signed)
CSN: [id number]     Arrival date & time 01/24/13  1726 History  This chart was scribed for Gerhard Munch, MD by Luisa Dago, ED Scribe. This patient was seen in room APA05/APA05 and the patient's care was started at 8:33 PM.    Chief Complaint  Patient presents with  . Flank Pain    The history is provided by the patient. No language interpreter was used.   HPI Comments: Dale Martinez is a 27 y.o. male who presents to the Emergency Department complaining of worsening left flank pain that started 1 day ago. Pt states that the pain radiates to his lower back and around to his abdomen. Pt complains of associated diaphoresis and nausea. He reports a history of kidney stones and believes that his pain is mimicking the symptoms of a passing kidney stone.  Pt states that he has not taken anything to alleviate his pain. Pt has not history of MI. Pt denies dysuria, hematuria, and testicular pain.  Past Medical History  Diagnosis Date  . Renal disorder   . Kidney stones   . Strep throat    History reviewed. No pertinent past surgical history. Family History  Problem Relation Age of Onset  . Diabetes Father    History  Substance Use Topics  . Smoking status: Current Every Day Smoker -- 1.00 packs/day    Types: Cigarettes  . Smokeless tobacco: Not on file  . Alcohol Use: No    Review of Systems  Constitutional:       Per HPI, otherwise negative  HENT:       Per HPI, otherwise negative  Respiratory:       Per HPI, otherwise negative  Cardiovascular:       Per HPI, otherwise negative  Gastrointestinal: Negative for vomiting.  Endocrine:       Negative aside from HPI  Genitourinary:       Neg aside from HPI   Musculoskeletal:       Per HPI, otherwise negative  Skin: Negative.   Neurological: Negative for syncope.    Allergies  Review of patient's allergies indicates no known allergies.  Home Medications   Current Outpatient Rx  Name  Route  Sig  Dispense  Refill  .  HYDROcodone-acetaminophen (NORCO/VICODIN) 5-325 MG per tablet   Oral   Take 2 tablets by mouth every 4 (four) hours as needed for pain.   6 tablet   0   . naproxen (NAPROSYN) 500 MG tablet   Oral   Take 1 tablet (500 mg total) by mouth 2 (two) times daily with a meal.   30 tablet   0   . tamsulosin (FLOMAX) 0.4 MG CAPS capsule   Oral   Take 1 capsule (0.4 mg total) by mouth 2 (two) times daily.   10 capsule   0    Triage vitals: BP 151/84  Pulse 106  Temp(Src) 97.8 F (36.6 C) (Oral)  Resp 14  SpO2 99%  Physical Exam  Nursing note and vitals reviewed. Constitutional: He is oriented to person, place, and time. He appears well-developed. No distress.  HENT:  Head: Normocephalic and atraumatic.  Eyes: Conjunctivae and EOM are normal.  Cardiovascular: Normal rate and regular rhythm.   Pulmonary/Chest: Effort normal. No stridor. No respiratory distress.  Abdominal: He exhibits no distension. There is tenderness.  Mild lower quadrant tenderness. Left CVA tenderness.  Musculoskeletal: He exhibits no edema.  Neurological: He is alert and oriented to person, place, and time.  Skin: Skin is warm and dry.  Psychiatric: He has a normal mood and affect.    ED Course  Procedures (including critical care time)  DIAGNOSTIC STUDIES: Oxygen Saturation is 99% on room air, normal by my interpretation.    COORDINATION OF CARE: 8:40 PM-Discussed treatment plan which includes UA and Ketorolac with pt at bedside and pt agreed to plan.    Labs Review Labs Reviewed  URINALYSIS, ROUTINE W REFLEX MICROSCOPIC   Imaging Review No results found.  EKG Interpretation   None      9:31 PM Patient continues to have mild pain. No fever, no evidence of decompensation.  Review of patient's chart demonstrates that he has been evaluated for flank multiple prior times.  Similarly, the patient has not followed up with urology in the past. When I had a lengthy conversation with him regarding  need for outpatient evaluation and management.  Absent distress, significant tenderness, peritonitis, no indication for imaging tonight. MDM  No diagnosis found.  I personally performed the services described in this documentation, which was scribed in my presence. The recorded information has been reviewed and is accurate.   Patient presents with flank pain.  Notably, the patient has had multiple similar presentations in the past years, multiple prior CT studies.  With no evidence of distress, infection, he was appropriate for discharge with outpatient management.  I had a lengthy conversation before she need for this to occur, and the patient endorsed an understanding.  Gerhard Munch, MD 01/24/13 2132

## 2013-01-24 NOTE — ED Notes (Signed)
Pt c/o left side flank pain and nausea. H/s kidney stones.

## 2013-04-03 ENCOUNTER — Emergency Department (HOSPITAL_COMMUNITY)
Admission: EM | Admit: 2013-04-03 | Discharge: 2013-04-04 | Disposition: A | Discharge status: Home / Self Care | Payer: Self-pay | Attending: Emergency Medicine | Admitting: Emergency Medicine

## 2013-04-03 ENCOUNTER — Encounter (HOSPITAL_COMMUNITY): Payer: Self-pay | Admitting: Emergency Medicine

## 2013-04-03 DIAGNOSIS — R319 Hematuria, unspecified: Secondary | ICD-10-CM | POA: Insufficient documentation

## 2013-04-03 DIAGNOSIS — R110 Nausea: Secondary | ICD-10-CM | POA: Insufficient documentation

## 2013-04-03 DIAGNOSIS — F17200 Nicotine dependence, unspecified, uncomplicated: Secondary | ICD-10-CM | POA: Insufficient documentation

## 2013-04-03 DIAGNOSIS — Z8619 Personal history of other infectious and parasitic diseases: Secondary | ICD-10-CM | POA: Insufficient documentation

## 2013-04-03 DIAGNOSIS — M545 Low back pain, unspecified: Secondary | ICD-10-CM | POA: Insufficient documentation

## 2013-04-03 DIAGNOSIS — Z87442 Personal history of urinary calculi: Secondary | ICD-10-CM | POA: Insufficient documentation

## 2013-04-03 DIAGNOSIS — R52 Pain, unspecified: Secondary | ICD-10-CM | POA: Insufficient documentation

## 2013-04-03 DIAGNOSIS — R109 Unspecified abdominal pain: Secondary | ICD-10-CM | POA: Insufficient documentation

## 2013-04-03 LAB — URINE MICROSCOPIC-ADD ON

## 2013-04-03 LAB — URINALYSIS, ROUTINE W REFLEX MICROSCOPIC
BILIRUBIN URINE: NEGATIVE
GLUCOSE, UA: NEGATIVE mg/dL
KETONES UR: NEGATIVE mg/dL
Leukocytes, UA: NEGATIVE
Nitrite: NEGATIVE
PH: 6 (ref 5.0–8.0)
Protein, ur: NEGATIVE mg/dL
SPECIFIC GRAVITY, URINE: 1.025 (ref 1.005–1.030)
Urobilinogen, UA: 0.2 mg/dL (ref 0.0–1.0)

## 2013-04-03 NOTE — ED Notes (Signed)
Patient reports nausea and left lower back pain radiating into left lower abdomen and left groin that started tonight.

## 2013-04-04 MED ORDER — HYDROCODONE-ACETAMINOPHEN 5-325 MG PO TABS
2.0000 | ORAL_TABLET | Freq: Once | ORAL | Status: AC
Start: 2013-04-04 — End: 2013-04-04
  Administered 2013-04-04: 2 via ORAL
  Filled 2013-04-04: qty 2

## 2013-04-04 MED ORDER — HYDROCODONE-ACETAMINOPHEN 5-325 MG PO TABS: 2.0000 | ORAL_TABLET | ORAL | Status: DC | PRN

## 2013-04-04 MED ORDER — KETOROLAC TROMETHAMINE 60 MG/2ML IM SOLN
60.0000 mg | Freq: Once | INTRAMUSCULAR | Status: AC
  Administered 2013-04-04: 60 mg via INTRAMUSCULAR
  Filled 2013-04-04: qty 2

## 2013-04-04 MED ORDER — TAMSULOSIN HCL 0.4 MG PO CAPS: 0.4000 mg | ORAL_CAPSULE | Freq: Every day | ORAL | Status: DC

## 2013-04-04 NOTE — Discharge Instructions (Signed)
Take ibuprofen every 6 hrs for pain. Stay well hydrated. Strain urine.  For severe pain take norco or vicodin however realize they have the potential for addiction and it can make you sleepy and has tylenol in it.  No operating machinery while taking. If you were given medicines take as directed.  If you are on coumadin or contraceptives realize their levels and effectiveness is altered by many different medicines.  If you have any reaction (rash, tongues swelling, other) to the medicines stop taking and see a physician.   Please follow up as directed and return to the ER or see a physician for new or worsening symptoms.  Thank you.

## 2013-04-04 NOTE — ED Provider Notes (Signed)
CSN: [id number]     Arrival date & time 04/03/13  2052 History  This chart was scribed for Enid SkeensJoshua M Lowell Mcgurk, MD by Ardelia Memsylan Malpass, ED Scribe. This patient was seen in room APA11/APA11 and the patient's care was started at 12:15 AM.   Chief Complaint  Patient presents with  . Groin Pain  . Nausea    The history is provided by the patient. No language interpreter was used.    HPI Comments: Dale Martinez is a 28 y.o. Male with a history of kidney stones who presents to the Emergency Department complaining of left lower back pain radiating into left lower abdomen and left groin that onset earlier tonight when pt was bending over at work. He reports associated nausea but denies vomiting. He states that he has no history of kidney problems other than a history of kidney stones. He denies headache, vision changes, chest pain, SOB or any other symptoms. He states that he has no medication allergies.    Past Medical History  Diagnosis Date  . Renal disorder   . Kidney stones   . Strep throat    History reviewed. No pertinent past surgical history. Family History  Problem Relation Age of Onset  . Diabetes Father    History  Substance Use Topics  . Smoking status: Current Every Day Smoker -- 1.00 packs/day    Types: Cigarettes  . Smokeless tobacco: Not on file  . Alcohol Use: No    Review of Systems  Eyes: Negative for visual disturbance.  Respiratory: Negative for shortness of breath.   Cardiovascular: Negative for chest pain.  Gastrointestinal: Positive for nausea and abdominal pain. Negative for vomiting.  Genitourinary: Positive for flank pain (left).  Musculoskeletal: Positive for back pain.  Neurological: Negative for headaches.  All other systems reviewed and are negative.   Allergies  Review of patient's allergies indicates no known allergies.  Home Medications   Current Outpatient Rx  Name  Route  Sig  Dispense  Refill  . HYDROcodone-acetaminophen (NORCO/VICODIN)  5-325 MG per tablet   Oral   Take 1 tablet by mouth every 6 (six) hours as needed.   10 tablet   0    Triage Vitals: BP 147/99  Pulse 97  Temp(Src) 97.9 F (36.6 C) (Oral)  Resp 24  Ht 5\' 11"  (1.803 m)  Wt 205 lb 8 oz (93.214 kg)  BMI 28.67 kg/m2  SpO2 99%  Physical Exam  Nursing note and vitals reviewed. Constitutional: He is oriented to person, place, and time. He appears well-developed and well-nourished. No distress.  Non-toxic appearing.  HENT:  Head: Normocephalic and atraumatic.  Eyes: EOM are normal. Pupils are equal, round, and reactive to light.  Neck: Neck supple. No tracheal deviation present.  Cardiovascular: Normal rate and regular rhythm.   Pulmonary/Chest: Effort normal. No respiratory distress.  Abdominal: Soft. There is no guarding.  Genitourinary:  Mild left lower flank tenderness.  Musculoskeletal: Normal range of motion.  No midline tenderness.  Neurological: He is alert and oriented to person, place, and time.  Skin: Skin is warm and dry.  Psychiatric: He has a normal mood and affect. His behavior is normal.    ED Course  Procedures (including critical care time)  Emergency Focused Ultrasound Exam Limited retroperitoneal ultrasound of kidneys  Performed and interpreted by Dr. Jodi MourningZavitz Indication: flank pain Focused abdominal ultrasound with both kidneys imaged in transverse and longitudinal planes in real-time. Interpretation: No significant hydronephrosis visualized.  No stones or cysts visualized  Images archived electronically  DIAGNOSTIC STUDIES: Oxygen Saturation is 99% on RA, normal by my interpretation.    COORDINATION OF CARE: 12:21 AM- Discussed plan to obtain UA. Will order Norco. Bedside US of the kidney does not reveal any significant swelling.Pt advised of plan for treatment and pt agrees.  Medications  ketorolac (TORADOL) injection 60 mg (60 mg Intramuscular Given 04/04/13 0021)   Labs Review Labs Reviewed  URINALYSIS,  ROUTINE W REFLEX MICROSCOPIC - Abnormal; Notable for the following:    Hgb urine dipstick LARGE (*)    All other components within normal limits  URINE MICROSCOPIC-ADD ON   Imaging Review No results found.  EKG Interpretation   None       MDM   Final diagnoses:  Acute left flank pain  Hematuria   I personally performed the services described in this documentation, which was scribed in my presence. The recorded information has been reviewed and is accurate. Clinically kidney stone, hx of similar, well appearing. Pain meds in ED, IM toradol and norco. Pain controlled.  CT would not change mgmt and only add cost/ risk to pt. Bedside US done by myself  Results and differential diagnosis were discussed with the patient. Close follow up outpatient was discussed, patient comfortable with the plan.      Enid Skeens, MD 04/04/13 [phone]

## 2013-04-19 ENCOUNTER — Emergency Department (HOSPITAL_COMMUNITY)
Admission: EM | Admit: 2013-04-19 | Discharge: 2013-04-19 | Disposition: A | Discharge status: Home / Self Care | Payer: Self-pay | Attending: Emergency Medicine | Admitting: Emergency Medicine

## 2013-04-19 ENCOUNTER — Encounter (HOSPITAL_COMMUNITY): Payer: Self-pay | Admitting: Emergency Medicine

## 2013-04-19 DIAGNOSIS — R109 Unspecified abdominal pain: Secondary | ICD-10-CM

## 2013-04-19 DIAGNOSIS — R1032 Left lower quadrant pain: Secondary | ICD-10-CM | POA: Insufficient documentation

## 2013-04-19 DIAGNOSIS — Z79899 Other long term (current) drug therapy: Secondary | ICD-10-CM | POA: Insufficient documentation

## 2013-04-19 DIAGNOSIS — Z8619 Personal history of other infectious and parasitic diseases: Secondary | ICD-10-CM | POA: Insufficient documentation

## 2013-04-19 DIAGNOSIS — R319 Hematuria, unspecified: Secondary | ICD-10-CM | POA: Insufficient documentation

## 2013-04-19 DIAGNOSIS — Z87442 Personal history of urinary calculi: Secondary | ICD-10-CM | POA: Insufficient documentation

## 2013-04-19 DIAGNOSIS — R350 Frequency of micturition: Secondary | ICD-10-CM | POA: Insufficient documentation

## 2013-04-19 DIAGNOSIS — F17200 Nicotine dependence, unspecified, uncomplicated: Secondary | ICD-10-CM | POA: Insufficient documentation

## 2013-04-19 LAB — URINALYSIS, ROUTINE W REFLEX MICROSCOPIC
Glucose, UA: NEGATIVE mg/dL
Ketones, ur: NEGATIVE mg/dL
Leukocytes, UA: NEGATIVE
NITRITE: NEGATIVE
UROBILINOGEN UA: 0.2 mg/dL (ref 0.0–1.0)
pH: 5.5 (ref 5.0–8.0)

## 2013-04-19 LAB — URINE MICROSCOPIC-ADD ON

## 2013-04-19 MED ORDER — NAPROXEN 500 MG PO TABS: 500.0000 mg | ORAL_TABLET | Freq: Two times a day (BID) | ORAL | Status: DC

## 2013-04-19 MED ORDER — KETOROLAC TROMETHAMINE 60 MG/2ML IM SOLN
60.0000 mg | Freq: Once | INTRAMUSCULAR | Status: AC
  Administered 2013-04-19: 60 mg via INTRAMUSCULAR
  Filled 2013-04-19: qty 2

## 2013-04-19 MED ORDER — HYDROCODONE-ACETAMINOPHEN 5-325 MG PO TABS
1.0000 | ORAL_TABLET | Freq: Once | ORAL | Status: AC
  Administered 2013-04-19: 1 via ORAL
  Filled 2013-04-19: qty 1

## 2013-04-19 MED ORDER — TAMSULOSIN HCL 0.4 MG PO CAPS: 0.4000 mg | ORAL_CAPSULE | Freq: Every day | ORAL | Status: DC

## 2013-04-19 NOTE — ED Provider Notes (Signed)
CSN: [id number]     Arrival date & time 04/19/13  0865 History   First MD Initiated Contact with Patient 04/19/13 0340     Chief Complaint  Patient presents with  . Dysuria    HPI Patient reports a long-standing history of kidney stones.  He reports developing left lower abdominal pain with radiation towards his left flank.  He states this feels like his prior kidney stones.  He reports urinary frequency without dysuria.  No fevers or chills.  No nausea or vomiting.  No other complaints.  Symptoms are mild to moderate in severity.  Nothing worsens or improves his symptoms    Past Medical History  Diagnosis Date  . Renal disorder   . Kidney stones   . Strep throat    History reviewed. No pertinent past surgical history. Family History  Problem Relation Age of Onset  . Diabetes Father    History  Substance Use Topics  . Smoking status: Current Every Day Smoker -- 1.00 packs/day    Types: Cigarettes  . Smokeless tobacco: Not on file  . Alcohol Use: No    Review of Systems  All other systems reviewed and are negative.      Allergies  Review of patient's allergies indicates no known allergies.  Home Medications   Current Outpatient Rx  Name  Route  Sig  Dispense  Refill  . HYDROcodone-acetaminophen (NORCO) 5-325 MG per tablet   Oral   Take 2 tablets by mouth every 4 (four) hours as needed.   15 tablet   0   . HYDROcodone-acetaminophen (NORCO/VICODIN) 5-325 MG per tablet   Oral   Take 1 tablet by mouth every 6 (six) hours as needed.   10 tablet   0   . tamsulosin (FLOMAX) 0.4 MG CAPS capsule   Oral   Take 1 capsule (0.4 mg total) by mouth daily.   7 capsule   0    BP 135/83  Pulse 88  Temp(Src) 97.9 F (36.6 C) (Oral)  Resp 16  Ht 5\' 11"  (1.803 m)  Wt 205 lb (92.987 kg)  BMI 28.60 kg/m2  SpO2 95% Physical Exam  Nursing note and vitals reviewed. Constitutional: He is oriented to person, place, and time. He appears well-developed and well-nourished.   HENT:  Head: Normocephalic and atraumatic.  Eyes: EOM are normal.  Neck: Normal range of motion.  Cardiovascular: Normal rate, regular rhythm, normal heart sounds and intact distal pulses.   Pulmonary/Chest: Effort normal and breath sounds normal. No respiratory distress.  Abdominal: Soft. He exhibits no distension. There is no tenderness.  Musculoskeletal: Normal range of motion.  Neurological: He is alert and oriented to person, place, and time.  Skin: Skin is warm and dry.  Psychiatric: He has a normal mood and affect. Judgment normal.    ED Course  Procedures (including critical care time)  Emergency Focused Ultrasound Exam  Limited retroperitoneal ultrasound of kidneys  Performed and interpreted by Natalina Wieting M Indication: flank pain  Focused abdominal ultrasound with both kidneys imaged in transverse and longitudinal planes in real-time.  Interpretation: No significant hydronephrosis visualized. No stones or cysts visualized  Images archived electronically  Labs Review Labs Reviewed  URINALYSIS, ROUTINE W REFLEX MICROSCOPIC - Abnormal; Notable for the following:    Specific Gravity, Urine >1.030 (*)    Hgb urine dipstick LARGE (*)    Bilirubin Urine SMALL (*)    Protein, ur TRACE (*)    All other components within normal limits  URINE  MICROSCOPIC-ADD ON - Abnormal; Notable for the following:    Squamous Epithelial / LPF MANY (*)    Bacteria, UA FEW (*)    All other components within normal limits   Imaging Review No results found.   EKG Interpretation None      MDM   Final diagnoses:  Hematuria  Flank pain    Pain improved. Likely ureteral colic. Urology follow up. Pain controlled. Dc home in good condition    Lyanne CoKevin M Marquist Binstock, MD 04/19/13 [phone]

## 2013-04-19 NOTE — ED Notes (Signed)
Pt reports being treated for kidney stones aproximately 2 weeks ago.  Reporting minimal pain in flank area at this time. Feeling a great deal of pain and pressure with urination. Reporting minimal difficulty starting urine flow.

## 2013-04-19 NOTE — Discharge Instructions (Signed)
Ureteral Colic (Kidney Stones) °Ureteral colic is the result of a condition when kidney stones form inside the kidney. Once kidney stones are formed they may move into the tube that connects the kidney with the bladder (ureter). If this occurs, this condition may cause pain (colic) in the ureter.  °CAUSES  °Pain is caused by stone movement in the ureter and the obstruction caused by the stone. °SYMPTOMS  °The pain comes and goes as the ureter contracts around the stone. The pain is usually intense, sharp, and stabbing in character. The location of the pain may move as the stone moves through the ureter. When the stone is near the kidney the pain is usually located in the back and radiates to the belly (abdomen). When the stone is ready to pass into the bladder the pain is often located in the lower abdomen on the side the stone is located. At this location, the symptoms may mimic those of a urinary tract infection with urinary frequency. Once the stone is located here it often passes into the bladder and the pain disappears completely. °TREATMENT  °· Your caregiver will provide you with medicine for pain relief. °· You may require specialized follow-up X-rays. °· The absence of pain does not always mean that the stone has passed. It may have just stopped moving. If the urine remains completely obstructed, it can cause loss of kidney function or even complete destruction of the involved kidney. It is your responsibility and in your interest that X-rays and follow-ups as suggested by your caregiver are completed. Relief of pain without passage of the stone can be associated with severe damage to the kidney, including loss of kidney function on that side. °· If your stone does not pass on its own, additional measures may be taken by your caregiver to ensure its removal. °HOME CARE INSTRUCTIONS  °· Increase your fluid intake. Water is the preferred fluid since juices containing vitamin C may acidify the urine making it  less likely for certain stones (uric acid stones) to pass. °· Strain all urine. A strainer will be provided. Keep all particulate matter or stones for your caregiver to inspect. °· Take your pain medicine as directed. °· Make a follow-up appointment with your caregiver as directed. °· Remember that the goal is passage of your stone. The absence of pain does not mean the stone is gone. Follow your caregiver's instructions. °· Only take over-the-counter or prescription medicines for pain, discomfort, or fever as directed by your caregiver. °SEEK MEDICAL CARE IF:  °· Pain cannot be controlled with the prescribed medicine. °· You have a fever. °· Pain continues for longer than your caregiver advises it should. °· There is a change in the pain, and you develop chest discomfort or constant abdominal pain. °· You feel faint or pass out. °MAKE SURE YOU:  °· Understand these instructions. °· Will watch your condition. °· Will get help right away if you are not doing well or get worse. °Document Released: 11/05/2004 Document Revised: 05/23/2012 Document Reviewed: 07/23/2010 °[employer]® Patient Information ©2014 [employer]. ° °

## 2013-06-12 ENCOUNTER — Encounter (HOSPITAL_COMMUNITY): Payer: Self-pay | Admitting: Emergency Medicine

## 2013-06-12 ENCOUNTER — Emergency Department (HOSPITAL_COMMUNITY): Payer: Self-pay

## 2013-06-12 ENCOUNTER — Emergency Department (HOSPITAL_COMMUNITY)
Admission: EM | Admit: 2013-06-12 | Discharge: 2013-06-12 | Disposition: A | Discharge status: Home / Self Care | Payer: Self-pay | Attending: Emergency Medicine | Admitting: Emergency Medicine

## 2013-06-12 DIAGNOSIS — Z79899 Other long term (current) drug therapy: Secondary | ICD-10-CM | POA: Insufficient documentation

## 2013-06-12 DIAGNOSIS — F17200 Nicotine dependence, unspecified, uncomplicated: Secondary | ICD-10-CM | POA: Insufficient documentation

## 2013-06-12 DIAGNOSIS — Z8619 Personal history of other infectious and parasitic diseases: Secondary | ICD-10-CM | POA: Insufficient documentation

## 2013-06-12 DIAGNOSIS — N200 Calculus of kidney: Secondary | ICD-10-CM | POA: Insufficient documentation

## 2013-06-12 DIAGNOSIS — R110 Nausea: Secondary | ICD-10-CM | POA: Insufficient documentation

## 2013-06-12 DIAGNOSIS — Z791 Long term (current) use of non-steroidal anti-inflammatories (NSAID): Secondary | ICD-10-CM | POA: Insufficient documentation

## 2013-06-12 LAB — CBC WITH DIFFERENTIAL/PLATELET
BASOS ABS: 0 10*3/uL (ref 0.0–0.1)
Basophils Relative: 0 % (ref 0–1)
EOS PCT: 4 % (ref 0–5)
Eosinophils Absolute: 0.5 10*3/uL (ref 0.0–0.7)
HCT: 45.8 % (ref 39.0–52.0)
Hemoglobin: 15.9 g/dL (ref 13.0–17.0)
LYMPHS ABS: 2.6 10*3/uL (ref 0.7–4.0)
LYMPHS PCT: 22 % (ref 12–46)
MCH: 31.4 pg (ref 26.0–34.0)
MCHC: 34.7 g/dL (ref 30.0–36.0)
MCV: 90.3 fL (ref 78.0–100.0)
MONOS PCT: 7 % (ref 3–12)
Monocytes Absolute: 0.8 10*3/uL (ref 0.1–1.0)
Neutro Abs: 7.9 10*3/uL — ABNORMAL HIGH (ref 1.7–7.7)
Neutrophils Relative %: 67 % (ref 43–77)
Platelets: 236 10*3/uL (ref 150–400)
RBC: 5.07 MIL/uL (ref 4.22–5.81)
RDW: 12.9 % (ref 11.5–15.5)
WBC: 11.8 10*3/uL — AB (ref 4.0–10.5)

## 2013-06-12 LAB — COMPREHENSIVE METABOLIC PANEL
ALK PHOS: 91 U/L (ref 39–117)
ALT: 21 U/L (ref 0–53)
AST: 17 U/L (ref 0–37)
Albumin: 3.8 g/dL (ref 3.5–5.2)
BILIRUBIN TOTAL: 0.3 mg/dL (ref 0.3–1.2)
BUN: 7 mg/dL (ref 6–23)
CHLORIDE: 103 meq/L (ref 96–112)
CO2: 25 meq/L (ref 19–32)
Calcium: 9.1 mg/dL (ref 8.4–10.5)
Creatinine, Ser: 0.74 mg/dL (ref 0.50–1.35)
GFR calc Af Amer: >90 mL/min (ref >90)
GFR calc non Af Amer: >90 mL/min (ref >90)
Glucose, Bld: 83 mg/dL (ref 70–99)
POTASSIUM: 3.7 meq/L (ref 3.7–5.3)
Sodium: 140 mEq/L (ref 137–147)
Total Protein: 7.2 g/dL (ref 6.0–8.3)

## 2013-06-12 LAB — URINE MICROSCOPIC-ADD ON

## 2013-06-12 LAB — URINALYSIS, ROUTINE W REFLEX MICROSCOPIC
BILIRUBIN URINE: NEGATIVE
GLUCOSE, UA: NEGATIVE mg/dL
KETONES UR: NEGATIVE mg/dL
Leukocytes, UA: NEGATIVE
Nitrite: NEGATIVE
PROTEIN: NEGATIVE mg/dL
Specific Gravity, Urine: >1.03 — ABNORMAL HIGH (ref 1.005–1.030)
Urobilinogen, UA: 0.2 mg/dL (ref 0.0–1.0)
pH: 6 (ref 5.0–8.0)

## 2013-06-12 MED ORDER — PROMETHAZINE HCL 25 MG PO TABS: 25.0000 mg | ORAL_TABLET | Freq: Four times a day (QID) | ORAL | Status: DC | PRN

## 2013-06-12 MED ORDER — OXYCODONE-ACETAMINOPHEN 5-325 MG PO TABS: 1.0000 | ORAL_TABLET | Freq: Four times a day (QID) | ORAL | Status: DC | PRN

## 2013-06-12 MED ORDER — HYDROMORPHONE HCL PF 1 MG/ML IJ SOLN
1.0000 mg | Freq: Once | INTRAMUSCULAR | Status: AC
  Administered 2013-06-12: 1 mg via INTRAVENOUS
  Filled 2013-06-12: qty 1

## 2013-06-12 MED ORDER — ONDANSETRON HCL 4 MG/2ML IJ SOLN
4.0000 mg | Freq: Once | INTRAMUSCULAR | Status: AC
  Administered 2013-06-12: 4 mg via INTRAVENOUS
  Filled 2013-06-12: qty 2

## 2013-06-12 NOTE — ED Notes (Signed)
Lt flank pain with nausea, onset 2 days ago.saw blood in urine 2 days ago.

## 2013-06-12 NOTE — Discharge Instructions (Signed)
Follow up with dr. Frann RiderJavid next week.

## 2013-06-12 NOTE — ED Notes (Signed)
Patient given discharge instruction, verbalized understand. IV removed, band aid applied. Patient ambulatory out of the department.  

## 2013-06-12 NOTE — ED Provider Notes (Signed)
CSN: [id number]     Arrival date & time 06/12/13  1802 History  This chart was scribed for Benny LennertJoseph L Samhita Kretsch, MD by Bennett Scrapehristina Taylor, ED Scribe. This patient was seen in room APA10/APA10 and the patient's care was started at 6:33 PM.   Chief Complaint  Patient presents with  . Flank Pain    Patient is a 28 y.o. male presenting with flank pain. The history is provided by the patient. No language interpreter was used.  Flank Pain This is a recurrent problem. The current episode started 2 days ago. The problem occurs daily. The problem has been gradually worsening. Pertinent negatives include no chest pain, no abdominal pain and no headaches. Nothing aggravates the symptoms. Nothing relieves the symptoms. He has tried ASA for the symptoms. The treatment provided no relief.    HPI Comments: Dale Martinez is a 28 y.o. male who presents to the Emergency Department complaining of 2 days of left flank pain that has been felt intermittently since the onset. He reports experiencing associated nausea and one episode of hematuria around the original onset. He states that he takes ASA daily which had been improving the pain but skipped his dose today due to the nausea. He has a h/o kidney stones and reports similarities. Last episode diagnosed as a kidney stone was one month ago.   Past Medical History  Diagnosis Date  . Strep throat   . Renal disorder   . Kidney stones    History reviewed. No pertinent past surgical history. Family History  Problem Relation Age of Onset  . Diabetes Father    History  Substance Use Topics  . Smoking status: Current Every Day Smoker -- 1.00 packs/day    Types: Cigarettes  . Smokeless tobacco: Not on file  . Alcohol Use: No    Review of Systems  Constitutional: Negative for appetite change and fatigue.  HENT: Negative for congestion, ear discharge and sinus pressure.   Eyes: Negative for discharge.  Respiratory: Negative for cough.   Cardiovascular: Negative  for chest pain.  Gastrointestinal: Positive for nausea. Negative for vomiting, abdominal pain and diarrhea.  Genitourinary: Positive for hematuria and flank pain. Negative for frequency.  Musculoskeletal: Negative for back pain.  Skin: Negative for rash.  Neurological: Negative for seizures and headaches.  Psychiatric/Behavioral: Negative for hallucinations.      Allergies  Review of patient's allergies indicates no known allergies.  Home Medications   Prior to Admission medications   Medication Sig Start Date End Date Taking? Authorizing Provider  HYDROcodone-acetaminophen (NORCO) 5-325 MG per tablet Take 2 tablets by mouth every 4 (four) hours as needed. 04/04/13   Enid SkeensJoshua M Zavitz, MD  HYDROcodone-acetaminophen (NORCO/VICODIN) 5-325 MG per tablet Take 1 tablet by mouth every 6 (six) hours as needed. 01/24/13   Gerhard Munchobert Lockwood, MD  naproxen (NAPROSYN) 500 MG tablet Take 1 tablet (500 mg total) by mouth 2 (two) times daily. 04/19/13   Lyanne CoKevin M Campos, MD  tamsulosin (FLOMAX) 0.4 MG CAPS capsule Take 1 capsule (0.4 mg total) by mouth daily. 04/04/13   Enid SkeensJoshua M Zavitz, MD  tamsulosin (FLOMAX) 0.4 MG CAPS capsule Take 1 capsule (0.4 mg total) by mouth daily. 04/19/13   Lyanne CoKevin M Campos, MD   Triage Vitals: BP 136/83  Pulse 95  Temp(Src) 97.9 F (36.6 C) (Oral)  Resp 24  Ht 5\' 11"  (1.803 m)  Wt 205 lb (92.987 kg)  BMI 28.60 kg/m2  SpO2 97%  Physical Exam  Nursing note and vitals  reviewed. Constitutional: He is oriented to person, place, and time. He appears well-developed and well-nourished.  HENT:  Head: Normocephalic and atraumatic.  Eyes: Conjunctivae and EOM are normal. No scleral icterus.  Neck: Neck supple. No thyromegaly present.  Cardiovascular: Normal rate and regular rhythm.  Exam reveals no gallop and no friction rub.   No murmur heard. Pulmonary/Chest: Effort normal and breath sounds normal. No stridor. He has no wheezes. He has no rales. He exhibits no tenderness.   Abdominal: He exhibits no distension. There is no tenderness. There is no rebound.  Musculoskeletal: Normal range of motion. He exhibits no edema.  Lymphadenopathy:    He has no cervical adenopathy.  Neurological: He is alert and oriented to person, place, and time. He exhibits normal muscle tone. Coordination normal.  Skin: Skin is warm and dry. No rash noted. No erythema.  Psychiatric: He has a normal mood and affect. His behavior is normal.    ED Course  Procedures (including critical care time)  Medications  HYDROmorphone (DILAUDID) injection 1 mg (not administered)  ondansetron (ZOFRAN) injection 4 mg (not administered)    DIAGNOSTIC STUDIES: Oxygen Saturation is 97% on RA, adequate by my interpretation.    COORDINATION OF CARE: 6:37 PM-Discussed treatment plan which includes medications, CBC panel, CMP and UA with pt at bedside and pt agreed to plan.   10:25 PM-Informed pt of radiology and lab work results. Discussed discharge plan which includes pain medication with pt and pt agreed to plan. Also advised pt to follow up with Urology as needed and pt agreed. Addressed symptoms to return for with pt.   Labs Review Labs Reviewed  CBC WITH DIFFERENTIAL - Abnormal; Notable for the following:    WBC 11.8 (*)    Neutro Abs 7.9 (*)    All other components within normal limits  URINALYSIS, ROUTINE W REFLEX MICROSCOPIC - Abnormal; Notable for the following:    Specific Gravity, Urine >1.030 (*)    Hgb urine dipstick TRACE (*)    All other components within normal limits  COMPREHENSIVE METABOLIC PANEL  URINE MICROSCOPIC-ADD ON    Imaging Review Dg Abd 1 View  06/12/2013   CLINICAL DATA:  Left lower quadrant pain, nausea/vomiting, history of kidney stones  EXAM: ABDOMEN - 1 VIEW  COMPARISON:  CT abdomen pelvis dated 07/15/2012  FINDINGS: Suspected 2 mm left lower pole renal calculus.  Additional possible 2-3 mm calculus overlying the left proximal collecting system/ureter,  equivocal.  Nonobstructive bowel gas pattern.  Visualized osseous structures are within normal limits.  IMPRESSION: Suspected 2 mm left lower pole renal calculus.  Additional possible 2-3 mm calculus overlying the left proximal collecting system/ureter, equivocal.   Electronically Signed   By: Charline BillsSriyesh  Krishnan M.D.   On: 06/12/2013 19:32     EKG Interpretation None      MDM   Final diagnoses:  None    The chart was scribed for me under my direct supervision.  I personally performed the history, physical, and medical decision making and all procedures in the evaluation of this patient.Benny Lennert.     Anya Murphey L Krystal Teachey, MD 06/12/13 2228

## 2013-07-27 ENCOUNTER — Emergency Department (HOSPITAL_COMMUNITY)
Admission: EM | Admit: 2013-07-27 | Discharge: 2013-07-27 | Disposition: A | Discharge status: Home / Self Care | Payer: Self-pay | Attending: Emergency Medicine | Admitting: Emergency Medicine

## 2013-07-27 ENCOUNTER — Encounter (HOSPITAL_COMMUNITY): Payer: Self-pay | Admitting: Emergency Medicine

## 2013-07-27 DIAGNOSIS — R319 Hematuria, unspecified: Secondary | ICD-10-CM

## 2013-07-27 DIAGNOSIS — R109 Unspecified abdominal pain: Secondary | ICD-10-CM

## 2013-07-27 DIAGNOSIS — R630 Anorexia: Secondary | ICD-10-CM | POA: Insufficient documentation

## 2013-07-27 DIAGNOSIS — Z8709 Personal history of other diseases of the respiratory system: Secondary | ICD-10-CM | POA: Insufficient documentation

## 2013-07-27 DIAGNOSIS — N390 Urinary tract infection, site not specified: Secondary | ICD-10-CM | POA: Insufficient documentation

## 2013-07-27 DIAGNOSIS — Z87442 Personal history of urinary calculi: Secondary | ICD-10-CM | POA: Insufficient documentation

## 2013-07-27 DIAGNOSIS — F17200 Nicotine dependence, unspecified, uncomplicated: Secondary | ICD-10-CM | POA: Insufficient documentation

## 2013-07-27 DIAGNOSIS — Z79899 Other long term (current) drug therapy: Secondary | ICD-10-CM | POA: Insufficient documentation

## 2013-07-27 LAB — URINALYSIS, ROUTINE W REFLEX MICROSCOPIC
Glucose, UA: NEGATIVE mg/dL
NITRITE: POSITIVE — AB
PH: 6.5 (ref 5.0–8.0)
Protein, ur: >300 mg/dL — AB
Urobilinogen, UA: 2 mg/dL — ABNORMAL HIGH (ref 0.0–1.0)

## 2013-07-27 LAB — URINE MICROSCOPIC-ADD ON

## 2013-07-27 MED ORDER — CIPROFLOXACIN HCL 250 MG PO TABS
500.0000 mg | ORAL_TABLET | Freq: Once | ORAL | Status: AC
  Administered 2013-07-27: 500 mg via ORAL
  Filled 2013-07-27: qty 2

## 2013-07-27 MED ORDER — CIPROFLOXACIN HCL 500 MG PO TABS: 500.0000 mg | ORAL_TABLET | Freq: Two times a day (BID) | ORAL | Status: DC

## 2013-07-27 MED ORDER — HYDROCODONE-ACETAMINOPHEN 5-325 MG PO TABS
2.0000 | ORAL_TABLET | Freq: Once | ORAL | Status: AC
  Administered 2013-07-27: 2 via ORAL
  Filled 2013-07-27: qty 2

## 2013-07-27 MED ORDER — KETOROLAC TROMETHAMINE 60 MG/2ML IM SOLN
60.0000 mg | Freq: Once | INTRAMUSCULAR | Status: AC
  Administered 2013-07-27: 60 mg via INTRAMUSCULAR
  Filled 2013-07-27: qty 2

## 2013-07-27 MED ORDER — HYDROCODONE-ACETAMINOPHEN 5-325 MG PO TABS: 1.0000 | ORAL_TABLET | ORAL | Status: DC | PRN

## 2013-07-27 NOTE — ED Provider Notes (Signed)
CSN: [id number]     Arrival date & time 07/27/13  0256 History   First MD Initiated Contact with Patient 07/27/13 0326     Chief Complaint  Patient presents with  . Flank Pain     (Consider location/radiation/quality/duration/timing/severity/associated sxs/prior Treatment) HPI Comments: [age] rolled male with history of kidney stones multiple presents with acute left flank pain this evening. This is similar to his previous kidney stones. Patient has been ill the past although stones has never acquired intervention. Patient denies fevers, chills or vomiting. Patient feels well otherwise and no abdominal surgery history. Pain is fairly constant this time no significant radiation  Patient is a 28 y.o. male presenting with flank pain. The history is provided by the patient.  Flank Pain This is a recurrent problem. Pertinent negatives include no chest pain, no abdominal pain, no headaches and no shortness of breath.    Past Medical History  Diagnosis Date  . Strep throat   . Renal disorder   . Kidney stones    History reviewed. No pertinent past surgical history. Family History  Problem Relation Age of Onset  . Diabetes Father    History  Substance Use Topics  . Smoking status: Current Every Day Smoker -- 1.00 packs/day    Types: Cigarettes  . Smokeless tobacco: Not on file  . Alcohol Use: No    Review of Systems  Constitutional: Positive for appetite change. Negative for fever and chills.  Respiratory: Negative for shortness of breath.   Cardiovascular: Negative for chest pain.  Gastrointestinal: Negative for vomiting and abdominal pain.  Genitourinary: Positive for flank pain. Negative for dysuria.  Musculoskeletal: Negative for back pain, neck pain and neck stiffness.  Skin: Negative for rash.  Neurological: Negative for light-headedness and headaches.      Allergies  Review of patient's allergies indicates no known allergies.  Home Medications   Prior to Admission  medications   Medication Sig Start Date End Date Taking? Authorizing Provider  ASPIRIN PO Take 2 tablets by mouth once as needed (for pain).   Yes Historical Provider, MD  promethazine (PHENERGAN) 25 MG tablet Take 1 tablet (25 mg total) by mouth every 6 (six) hours as needed for nausea or vomiting. 06/12/13  Yes Benny LennertJoseph L Zammit, MD  oxyCODONE-acetaminophen (PERCOCET/ROXICET) 5-325 MG per tablet Take 1 tablet by mouth every 6 (six) hours as needed for severe pain. 06/12/13   Benny LennertJoseph L Zammit, MD   BP 139/85  Pulse 89  Temp(Src) 97.7 F (36.5 C) (Oral)  Resp 20  Ht 5\' 11"  (1.803 m)  Wt 205 lb (92.987 kg)  BMI 28.60 kg/m2  SpO2 96% Physical Exam  Nursing note and vitals reviewed. Constitutional: He is oriented to person, place, and time. He appears well-developed and well-nourished.  HENT:  Head: Normocephalic and atraumatic.  Eyes: Conjunctivae are normal. Right eye exhibits no discharge. Left eye exhibits no discharge.  Neck: Normal range of motion. Neck supple. No tracheal deviation present.  Cardiovascular: Normal rate and regular rhythm.   Pulmonary/Chest: Effort normal and breath sounds normal.  Abdominal: Soft. He exhibits no distension. There is no tenderness. There is no guarding.  Musculoskeletal: He exhibits tenderness (mild left flank). He exhibits no edema.  Neurological: He is alert and oriented to person, place, and time.  Skin: Skin is warm. No rash noted.  Psychiatric: He has a normal mood and affect.    ED Course  Procedures (including critical care time) Emergency Focused Ultrasound Exam Limited retroperitoneal ultrasound of  kidneys  Performed and interpreted by Dr. Jodi MourningZavitz Indication: flank pain Focused abdominal ultrasound with both kidneys imaged in transverse and longitudinal planes in real-time. Interpretation: mild left hydronephrosis visualized. Images archived electronically  Labs Review Labs Reviewed  URINALYSIS, ROUTINE W REFLEX MICROSCOPIC - Abnormal;  Notable for the following:    Color, Urine RED (*)    APPearance CLOUDY (*)    Specific Gravity, Urine >1.030 (*)    Hgb urine dipstick LARGE (*)    Bilirubin Urine MODERATE (*)    Ketones, ur TRACE (*)    Protein, ur >300 (*)    Urobilinogen, UA 2.0 (*)    Nitrite POSITIVE (*)    Leukocytes, UA TRACE (*)    All other components within normal limits  URINE MICROSCOPIC-ADD ON - Abnormal; Notable for the following:    Bacteria, UA MANY (*)    All other components within normal limits  URINE CULTURE    Imaging Review No results found.   EKG Interpretation None      MDM   Final diagnoses:  Left flank pain  Hematuria  UTI (lower urinary tract infection)    Well-appearing overall healthy male with clinically renal colic. Patient tolerating oral fluids, Toradol IM and Norco given. Urinalysis pending and discussed close followup with urology.  On recheck patient has no pain, well-appearing, no vomiting or fevers. Concern for mild urine infection and with likely kidney stone I discussed the importance of close followup outpatient strict reasons to return. Patient comfortable the outpatient plan. Cipro given in ER. Results and differential diagnosis were discussed with the patient/parent/guardian. Close follow up outpatient was discussed, comfortable with the plan.   Medications  HYDROcodone-acetaminophen (NORCO/VICODIN) 5-325 MG per tablet 2 tablet (2 tablets Oral Given 07/27/13 0334)  ketorolac (TORADOL) injection 60 mg (60 mg Intramuscular Given 07/27/13 0334)  ciprofloxacin (CIPRO) tablet 500 mg (500 mg Oral Given 07/27/13 0354)    Filed Vitals:   07/27/13 0307  BP: 139/85  Pulse: 89  Temp: 97.7 F (36.5 C)  TempSrc: Oral  Resp: 20  Height: 5\' 11"  (1.803 m)  Weight: 205 lb (92.987 kg)  SpO2: 96%         Enid SkeensJoshua M Zavitz, MD 07/27/13 [phone]

## 2013-07-27 NOTE — ED Notes (Signed)
Pt c/o left flank pain that started this am.

## 2013-07-27 NOTE — ED Notes (Signed)
Dr. Jodi MourningZavitz at bedside with ultrasound machine.

## 2013-07-27 NOTE — Discharge Instructions (Signed)
Take ibuprofen every 6 hours as needed for pain. Stay well-hydrated. Take antibiotics as directed and return to the ER she develop fevers, persistent vomiting, uncontrollable pain or new or worsening symptoms. Follow up with your urologist as discussed.  If you were given medicines take as directed.  If you are on coumadin or contraceptives realize their levels and effectiveness is altered by many different medicines.  If you have any reaction (rash, tongues swelling, other) to the medicines stop taking and see a physician.   Please follow up as directed and return to the ER or see a physician for new or worsening symptoms.  Thank you. Filed Vitals:   07/27/13 0307  BP: 139/85  Pulse: 89  Temp: 97.7 F (36.5 C)  TempSrc: Oral  Resp: 20  Height: 5\' 11"  (1.803 m)  Weight: 205 lb (92.987 kg)  SpO2: 96%

## 2013-07-28 LAB — URINE CULTURE: Colony Count: 5000

## 2013-08-01 ENCOUNTER — Encounter (HOSPITAL_COMMUNITY): Payer: Self-pay | Admitting: Emergency Medicine

## 2013-08-01 ENCOUNTER — Emergency Department (HOSPITAL_COMMUNITY)
Admission: EM | Admit: 2013-08-01 | Discharge: 2013-08-01 | Disposition: A | Discharge status: Home / Self Care | Payer: Self-pay | Attending: Emergency Medicine | Admitting: Emergency Medicine

## 2013-08-01 DIAGNOSIS — R112 Nausea with vomiting, unspecified: Secondary | ICD-10-CM | POA: Insufficient documentation

## 2013-08-01 DIAGNOSIS — Z87442 Personal history of urinary calculi: Secondary | ICD-10-CM | POA: Insufficient documentation

## 2013-08-01 DIAGNOSIS — R6883 Chills (without fever): Secondary | ICD-10-CM | POA: Insufficient documentation

## 2013-08-01 DIAGNOSIS — Z792 Long term (current) use of antibiotics: Secondary | ICD-10-CM | POA: Insufficient documentation

## 2013-08-01 DIAGNOSIS — Z87448 Personal history of other diseases of urinary system: Secondary | ICD-10-CM | POA: Insufficient documentation

## 2013-08-01 DIAGNOSIS — F17200 Nicotine dependence, unspecified, uncomplicated: Secondary | ICD-10-CM | POA: Insufficient documentation

## 2013-08-01 DIAGNOSIS — R109 Unspecified abdominal pain: Secondary | ICD-10-CM | POA: Insufficient documentation

## 2013-08-01 DIAGNOSIS — Z79899 Other long term (current) drug therapy: Secondary | ICD-10-CM | POA: Insufficient documentation

## 2013-08-01 DIAGNOSIS — R300 Dysuria: Secondary | ICD-10-CM | POA: Insufficient documentation

## 2013-08-01 DIAGNOSIS — Z8619 Personal history of other infectious and parasitic diseases: Secondary | ICD-10-CM | POA: Insufficient documentation

## 2013-08-01 DIAGNOSIS — R197 Diarrhea, unspecified: Secondary | ICD-10-CM | POA: Insufficient documentation

## 2013-08-01 LAB — CBC WITH DIFFERENTIAL/PLATELET
Basophils Absolute: 0 10*3/uL (ref 0.0–0.1)
Basophils Relative: 0 % (ref 0–1)
Eosinophils Absolute: 0.2 10*3/uL (ref 0.0–0.7)
Eosinophils Relative: 1 % (ref 0–5)
HCT: 45.9 % (ref 39.0–52.0)
Hemoglobin: 15.7 g/dL (ref 13.0–17.0)
Lymphocytes Relative: 13 % (ref 12–46)
Lymphs Abs: 1.9 10*3/uL (ref 0.7–4.0)
MCH: 31.2 pg (ref 26.0–34.0)
MCHC: 34.2 g/dL (ref 30.0–36.0)
MCV: 91.1 fL (ref 78.0–100.0)
Monocytes Absolute: 1.2 10*3/uL — ABNORMAL HIGH (ref 0.1–1.0)
Monocytes Relative: 8 % (ref 3–12)
Neutro Abs: 11.7 10*3/uL — ABNORMAL HIGH (ref 1.7–7.7)
Neutrophils Relative %: 78 % — ABNORMAL HIGH (ref 43–77)
Platelets: 237 10*3/uL (ref 150–400)
RBC: 5.04 MIL/uL (ref 4.22–5.81)
RDW: 13 % (ref 11.5–15.5)
WBC: 14.9 10*3/uL — ABNORMAL HIGH (ref 4.0–10.5)

## 2013-08-01 LAB — URINALYSIS, ROUTINE W REFLEX MICROSCOPIC
Bilirubin Urine: NEGATIVE
Glucose, UA: NEGATIVE mg/dL
Hgb urine dipstick: NEGATIVE
Ketones, ur: NEGATIVE mg/dL
Leukocytes, UA: NEGATIVE
Nitrite: NEGATIVE
Protein, ur: NEGATIVE mg/dL
Specific Gravity, Urine: >1.03 — ABNORMAL HIGH (ref 1.005–1.030)
Urobilinogen, UA: 0.2 mg/dL (ref 0.0–1.0)
pH: 6 (ref 5.0–8.0)

## 2013-08-01 LAB — BASIC METABOLIC PANEL
BUN: 14 mg/dL (ref 6–23)
CO2: 23 mEq/L (ref 19–32)
Calcium: 9.9 mg/dL (ref 8.4–10.5)
Chloride: 99 mEq/L (ref 96–112)
Creatinine, Ser: 0.72 mg/dL (ref 0.50–1.35)
GFR calc Af Amer: >90 mL/min (ref >90)
GFR calc non Af Amer: >90 mL/min (ref >90)
Glucose, Bld: 103 mg/dL — ABNORMAL HIGH (ref 70–99)
Potassium: 3.6 mEq/L — ABNORMAL LOW (ref 3.7–5.3)
Sodium: 137 mEq/L (ref 137–147)

## 2013-08-01 MED ORDER — SODIUM CHLORIDE 0.9 % IV BOLUS (SEPSIS)
1000.0000 mL | Freq: Once | INTRAVENOUS | Status: AC
  Administered 2013-08-01: 1000 mL via INTRAVENOUS

## 2013-08-01 MED ORDER — ONDANSETRON HCL 4 MG PO TABS: 4.0000 mg | ORAL_TABLET | Freq: Four times a day (QID) | ORAL | Status: DC

## 2013-08-01 MED ORDER — KETOROLAC TROMETHAMINE 30 MG/ML IJ SOLN
15.0000 mg | Freq: Once | INTRAMUSCULAR | Status: AC
  Administered 2013-08-01: 15 mg via INTRAVENOUS
  Filled 2013-08-01: qty 1

## 2013-08-01 MED ORDER — HYDROMORPHONE HCL PF 1 MG/ML IJ SOLN
1.0000 mg | Freq: Once | INTRAMUSCULAR | Status: AC
  Administered 2013-08-01: 1 mg via INTRAVENOUS
  Filled 2013-08-01: qty 1

## 2013-08-01 MED ORDER — ONDANSETRON HCL 4 MG/2ML IJ SOLN
4.0000 mg | Freq: Once | INTRAMUSCULAR | Status: AC
  Administered 2013-08-01: 4 mg via INTRAMUSCULAR
  Filled 2013-08-01: qty 2

## 2013-08-01 NOTE — ED Provider Notes (Signed)
CSN: [id number]     Arrival date & time 08/01/13  1441 History  This chart was scribed for Raeford RazorStephen Kohut, MD by Leone PayorSonum Patel, ED Scribe. This patient was seen in room APA19/APA19 and the patient's care was started 2:52 PM.    Chief Complaint  Patient presents with  . Emesis      The history is provided by the patient. No language interpreter was used.    HPI Comments: Dale Martinez is a 28 y.o. male with past medical history of kidney stones, renal disorder who presents to the Emergency Department complaining of constant left flank pain that returned today. Patient was seen on 07/27/13 for similar symptoms and was started on Cipro. Patient states he has been compliant with this medication but believes to have developed a cough since taking it. Patient states he passed a kidney stone today but began to have the left  flank pain again with associated nausea soon after this. He describes the pain as "pulsing and spasming" and states it is non-radiating. He also reports associated dysuria, intermittent chills, and mild diarrhea. He denies hematuria, fever.  Past Medical History  Diagnosis Date  . Strep throat   . Renal disorder   . Kidney stones    History reviewed. No pertinent past surgical history. Family History  Problem Relation Age of Onset  . Diabetes Father    History  Substance Use Topics  . Smoking status: Current Every Day Smoker -- 1.00 packs/day    Types: Cigarettes  . Smokeless tobacco: Not on file  . Alcohol Use: No    Review of Systems  Constitutional: Positive for chills. Negative for fever.  Gastrointestinal: Positive for nausea and diarrhea. Negative for abdominal pain.  Genitourinary: Positive for dysuria and flank pain. Negative for hematuria.  All other systems reviewed and are negative.     Allergies  Review of patient's allergies indicates no known allergies.  Home Medications   Prior to Admission medications   Medication Sig Start Date End Date  Taking? Authorizing Provider  ASPIRIN PO Take 2 tablets by mouth once as needed (for pain).    Historical Provider, MD  ciprofloxacin (CIPRO) 500 MG tablet Take 1 tablet (500 mg total) by mouth 2 (two) times daily. One po bid x 7 days 07/27/13   Enid SkeensJoshua M Zavitz, MD  HYDROcodone-acetaminophen [facility](NORCO) 5-325 MG per tablet Take 1-2 tablets by mouth every 4 (four) hours as needed. 07/27/13   Enid SkeensJoshua M Zavitz, MD  oxyCODONE-acetaminophen (PERCOCET/ROXICET) 5-325 MG per tablet Take 1 tablet by mouth every 6 (six) hours as needed for severe pain. 06/12/13   Benny LennertJoseph L Zammit, MD  promethazine (PHENERGAN) 25 MG tablet Take 1 tablet (25 mg total) by mouth every 6 (six) hours as needed for nausea or vomiting. 06/12/13   Benny LennertJoseph L Zammit, MD   BP 151/86  Pulse 92  Temp(Src) 97.9 F (36.6 C) (Oral)  Resp 18  Ht 5\' 11"  (1.803 m)  Wt 205 lb (92.987 kg)  BMI 28.60 kg/m2  SpO2 97% Physical Exam  Nursing note and vitals reviewed. Constitutional: He is oriented to person, place, and time. He appears well-developed and well-nourished.  HENT:  Head: Normocephalic and atraumatic.  Eyes: EOM are normal.  Neck: Normal range of motion.  Cardiovascular: Normal rate, regular rhythm, normal heart sounds and intact distal pulses.   Pulmonary/Chest: Effort normal and breath sounds normal. No respiratory distress.  Abdominal: Soft. He exhibits no distension. There is no tenderness.  Genitourinary:  Left  CVA tenderness  Musculoskeletal: Normal range of motion.  Neurological: He is alert and oriented to person, place, and time.  Skin: Skin is warm and dry.  Psychiatric: He has a normal mood and affect. Judgment normal.    ED Course  Procedures (including critical care time)  DIAGNOSTIC STUDIES: Oxygen Saturation is 97% on RA, adequate by my interpretation.    COORDINATION OF CARE: 3:00 PM Discussed treatment plan with pt at bedside and pt agreed to plan.   Labs Review Labs Reviewed - No data to display  Imaging  Review No results found.   EKG Interpretation None      MDM   Final diagnoses:  Flank pain  Nausea and vomiting, vomiting of unspecified type    28 year old male with flank pain and nausea vomiting. Possibly secondary to continued purulent specimen after a passed stone. Abdominal exam is benign. Afebrile. Symptoms controlled. Plan continue symptomatic treatment with medications as needed. Return precautions were discussed.  I personally preformed the services scribed in my presence. The recorded information has been reviewed is accurate. Raeford RazorStephen Kohut, MD.   Raeford RazorStephen Kohut, MD 08/06/13 1630

## 2013-08-01 NOTE — ED Notes (Signed)
MD at bedside. 

## 2013-08-01 NOTE — ED Notes (Signed)
Pt c/o n/v that began earlier today. Pt states he passed two kidney stones this morning and n/v began shortly after. Pt also reports "mild" lower left abdominal pain.

## 2013-08-01 NOTE — Care Management Note (Signed)
ED/CM noted patient did not have health insurance and/or PCP listed in the computer.  Patient was given the [place] resource handout with information on the clinics, food pantries, and the handout for new health insurance sign-up.  Patient expressed appreciation for information received. 

## 2013-08-01 NOTE — Discharge Instructions (Signed)
Flank Pain °Flank pain refers to pain that is located on the side of the body between the upper abdomen and the back. The pain may occur over a short period of time (acute) or may be long-term or reoccurring (chronic). It may be mild or severe. Flank pain can be caused by many things. °CAUSES  °Some of the more common causes of flank pain include: °· Muscle strains.   °· Muscle spasms.   °· A disease of your spine (vertebral disk disease).   °· A lung infection (pneumonia).   °· Fluid around your lungs (pulmonary edema).   °· A kidney infection.   °· Kidney stones.   °· A very painful skin rash caused by the chickenpox virus (shingles).   °· Gallbladder disease.   °HOME CARE INSTRUCTIONS  °Home care will depend on the cause of your pain. In general, °· Rest as directed by your caregiver. °· Drink enough fluids to keep your urine clear or pale yellow. °· Only take over-the-counter or prescription medicines as directed by your caregiver. Some medicines may help relieve the pain. °· Tell your caregiver about any changes in your pain. °· Follow up with your caregiver as directed. °SEEK IMMEDIATE MEDICAL CARE IF:  °· Your pain is not controlled with medicine.   °· You have new or worsening symptoms. °· Your pain increases.   °· You have abdominal pain.   °· You have shortness of breath.   °· You have persistent nausea or vomiting.   °· You have swelling in your abdomen.   °· You feel faint or pass out.   °· You have blood in your urine. °· You have a fever or persistent symptoms for more than 2-3 days. °· You have a fever and your symptoms suddenly get worse. °MAKE SURE YOU:  °· Understand these instructions. °· Will watch your condition. °· Will get help right away if you are not doing well or get worse. °Document Released: 03/19/2005 Document Revised: 10/21/2011 Document Reviewed: 09/10/2011 °ExitCare® Patient Information ©2015 [employer]. This information is not intended to replace advice given to you by your  health care provider. Make sure you discuss any questions you have with your health care provider. ° °

## 2013-08-29 ENCOUNTER — Emergency Department (HOSPITAL_COMMUNITY)
Admission: EM | Admit: 2013-08-29 | Discharge: 2013-08-29 | Disposition: A | Discharge status: Home / Self Care | Payer: Self-pay | Attending: Emergency Medicine | Admitting: Emergency Medicine

## 2013-08-29 ENCOUNTER — Emergency Department (HOSPITAL_COMMUNITY): Payer: Self-pay

## 2013-08-29 ENCOUNTER — Encounter (HOSPITAL_COMMUNITY): Payer: Self-pay | Admitting: Emergency Medicine

## 2013-08-29 DIAGNOSIS — F17200 Nicotine dependence, unspecified, uncomplicated: Secondary | ICD-10-CM | POA: Insufficient documentation

## 2013-08-29 DIAGNOSIS — R109 Unspecified abdominal pain: Secondary | ICD-10-CM | POA: Insufficient documentation

## 2013-08-29 DIAGNOSIS — N200 Calculus of kidney: Secondary | ICD-10-CM | POA: Insufficient documentation

## 2013-08-29 DIAGNOSIS — Z79899 Other long term (current) drug therapy: Secondary | ICD-10-CM | POA: Insufficient documentation

## 2013-08-29 DIAGNOSIS — Z792 Long term (current) use of antibiotics: Secondary | ICD-10-CM | POA: Insufficient documentation

## 2013-08-29 LAB — URINALYSIS, ROUTINE W REFLEX MICROSCOPIC
Bilirubin Urine: NEGATIVE
Glucose, UA: NEGATIVE mg/dL
Hgb urine dipstick: NEGATIVE
KETONES UR: NEGATIVE mg/dL
Leukocytes, UA: NEGATIVE
NITRITE: NEGATIVE
PH: 5.5 (ref 5.0–8.0)
Protein, ur: NEGATIVE mg/dL
SPECIFIC GRAVITY, URINE: 1.03 (ref 1.005–1.030)
Urobilinogen, UA: 0.2 mg/dL (ref 0.0–1.0)

## 2013-08-29 MED ORDER — HYDROMORPHONE HCL PF 1 MG/ML IJ SOLN
1.0000 mg | Freq: Once | INTRAMUSCULAR | Status: AC
  Administered 2013-08-29: 1 mg via INTRAVENOUS
  Filled 2013-08-29: qty 1

## 2013-08-29 MED ORDER — OXYCODONE-ACETAMINOPHEN 5-325 MG PO TABS: 1.0000 | ORAL_TABLET | ORAL | Status: DC | PRN

## 2013-08-29 MED ORDER — SODIUM CHLORIDE 0.9 % IJ SOLN
INTRAMUSCULAR | Status: DC
Start: 2013-08-29 — End: 2013-08-29
  Filled 2013-08-29: qty 30

## 2013-08-29 MED ORDER — SODIUM CHLORIDE 0.9 % IJ SOLN
INTRAMUSCULAR | Status: AC
  Filled 2013-08-29: qty 500

## 2013-08-29 MED ORDER — ONDANSETRON HCL 4 MG/2ML IJ SOLN
4.0000 mg | Freq: Once | INTRAMUSCULAR | Status: AC
  Administered 2013-08-29: 4 mg via INTRAVENOUS
  Filled 2013-08-29: qty 2

## 2013-08-29 NOTE — ED Provider Notes (Signed)
CSN: [id number]     Arrival date & time 08/29/13  0424 History   First MD Initiated Contact with Patient 08/29/13 [phone]     Chief Complaint  Patient presents with  . Flank Pain     (Consider location/radiation/quality/duration/timing/severity/associated sxs/prior Treatment) Patient is a 28 y.o. male presenting with flank pain. The history is provided by the patient.  Flank Pain  He started having left flank pain at about 11:30 PM. Pain is radiating into the left side of the groin. Pain is moderately severe and he rates it 8/10. Nothing makes it better nothing makes it worse. There is associated nausea but no vomiting. Of note, he had diarrhea yesterday but that seems to have subsided. Last bowel movement was about 2 AM. He denies fever chills. He has history of kidney stones and states that this is how his kidney stones he fell in the past. He is also noted some slight dysuria this morning. He has not taken anything for pain.  Past Medical History  Diagnosis Date  . Strep throat   . Renal disorder   . Kidney stones    History reviewed. No pertinent past surgical history. Family History  Problem Relation Age of Onset  . Diabetes Father    History  Substance Use Topics  . Smoking status: Current Every Day Smoker -- 1.00 packs/day    Types: Cigarettes  . Smokeless tobacco: Not on file  . Alcohol Use: No    Review of Systems  Genitourinary: Positive for flank pain.  All other systems reviewed and are negative.     Allergies  Review of patient's allergies indicates no known allergies.  Home Medications   Prior to Admission medications   Medication Sig Start Date End Date Taking? Authorizing Provider  ciprofloxacin (CIPRO) 500 MG tablet Take 1 tablet (500 mg total) by mouth 2 (two) times daily. One po bid x 7 days 07/27/13   Enid Skeens, MD  HYDROcodone-acetaminophen [facility]) 5-325 MG per tablet Take 1-2 tablets by mouth every 4 (four) hours as needed. 07/27/13   Enid Skeens, MD  ondansetron (ZOFRAN) 4 MG tablet Take 1 tablet (4 mg total) by mouth every 6 (six) hours. 08/01/13   Raeford Razor, MD   BP 134/94  Pulse 96  Temp(Src) 98.4 F (36.9 C) (Oral)  Resp 16  Ht 5\' 11"  (1.803 m)  Wt 205 lb (92.987 kg)  BMI 28.60 kg/m2  SpO2 97% Physical Exam  Nursing note and vitals reviewed.  28 year old male, resting comfortably and in no acute distress. Vital signs are significant for borderline hypertension with blood pressure 134/94. Oxygen saturation is 97%, which is normal. Head is normocephalic and atraumatic. PERRLA, EOMI. Oropharynx is clear. Neck is nontender and supple without adenopathy or JVD. Back is nontender in the midline. There is mild left CVA tenderness. Lungs are clear without rales, wheezes, or rhonchi. Chest is nontender. Heart has regular rate and rhythm without murmur. Abdomen is soft, flat, nontender without masses or hepatosplenomegaly and peristalsis is hypoactive. Extremities have no cyanosis or edema, full range of motion is present. Skin is warm and dry without rash. Neurologic: Mental status is normal, cranial nerves are intact, there are no motor or sensory deficits.   ED Course  Procedures (including critical care time) Labs Review Results for orders placed during the hospital encounter of 08/29/13  URINALYSIS, ROUTINE W REFLEX MICROSCOPIC      Result Value Ref Range   Color, Urine YELLOW  YELLOW  APPearance CLEAR  CLEAR   Specific Gravity, Urine 1.030  1.005 - 1.030   pH 5.5  5.0 - 8.0   Glucose, UA NEGATIVE  NEGATIVE mg/dL   Hgb urine dipstick NEGATIVE  NEGATIVE   Bilirubin Urine NEGATIVE  NEGATIVE   Ketones, ur NEGATIVE  NEGATIVE mg/dL   Protein, ur NEGATIVE  NEGATIVE mg/dL   Urobilinogen, UA 0.2  0.0 - 1.0 mg/dL   Nitrite NEGATIVE  NEGATIVE   Leukocytes, UA NEGATIVE  NEGATIVE   Imaging Review Ct Abdomen Pelvis Wo Contrast  08/29/2013   CLINICAL DATA:  Left flank pain.  EXAM: CT ABDOMEN AND PELVIS WITHOUT  CONTRAST  TECHNIQUE: Multidetector CT imaging of the abdomen and pelvis was performed following the standard protocol without IV contrast.  COMPARISON:  07/25/2012.  FINDINGS: The lung bases are clear. No pleural effusion or pulmonary nodule. The heart is normal in size. No pericardial effusion.  The unenhanced appearance of the liver and spleen are unremarkable. No focal lesions. The gallbladder is normal. No common bile duct dilatation. The pancreas is normal. The adrenal glands normal. There are multiple left-sided renal calculi. No right-sided renal calculi. No obstructing ureteral calculi or bladder calculi.  The stomach, duodenum, small bowel and colon are grossly normal without oral contrast. No inflammatory changes, mass lesions or obstructive findings. The aorta is normal in caliber. Stable borderline retroperitoneal lymph nodes.  The bladder, prostate gland and seminal vesicles are unremarkable. No pelvic mass, adenopathy or free pelvic fluid collections. No inguinal mass or adenopathy. Stable diverticulosis of the sigmoid colon without findings for acute diverticulitis. The appendix is normal.  The bony structures are unremarkable.  IMPRESSION: 1. Multiple left-sided renal calculi but no obstructing ureteral calculi. 2. No acute abdominal/pelvic findings, mass lesions or adenopathy. Stable retroperitoneal lymph nodes when looking back at multiple prior CT scans.   Electronically Signed   By: Loralie ChampagneMark  Gallerani M.D.   On: 08/29/2013 07:10   MDM   Final diagnoses:  Acute left flank pain  Nephrolithiasis    Flank pain which is worrisome for ureteral colic. Old records are reviewed and he had a CT scan about one year ago which did show residual renal calculi in the left kidney. You will be sent for CT scan to evaluate this today. In the meantime, he is given hydromorphone for pain and ondansetron for nausea.  CT shows stable nephrolithiasis without any ureteral calculi or hydronephrosis. Patient is  resting comfortably in the ED. I have explained the findings to him and he is being discharged with a prescription for oxycodone acetaminophen.  Dione Boozeavid Urijah Arko, MD 08/29/13 [id number]

## 2013-08-29 NOTE — Discharge Instructions (Signed)
Flank Pain Flank pain refers to pain that is located on the side of the body between the upper abdomen and the back. The pain may occur over a short period of time (acute) or may be long-term or reoccurring (chronic). It may be mild or severe. Flank pain can be caused by many things. CAUSES  Some of the more common causes of flank pain include:  Muscle strains.   Muscle spasms.   A disease of your spine (vertebral disk disease).   A lung infection (pneumonia).   Fluid around your lungs (pulmonary edema).   A kidney infection.   Kidney stones.   A very painful skin rash caused by the chickenpox virus (shingles).   Gallbladder disease.  HOME CARE INSTRUCTIONS  Home care will depend on the cause of your pain. In general,  Rest as directed by your caregiver.  Drink enough fluids to keep your urine clear or pale yellow.  Only take over-the-counter or prescription medicines as directed by your caregiver. Some medicines may help relieve the pain.  Tell your caregiver about any changes in your pain.  Follow up with your caregiver as directed. SEEK IMMEDIATE MEDICAL CARE IF:   Your pain is not controlled with medicine.   You have new or worsening symptoms.  Your pain increases.   You have abdominal pain.   You have shortness of breath.   You have persistent nausea or vomiting.   You have swelling in your abdomen.   You feel faint or pass out.   You have blood in your urine.  You have a fever or persistent symptoms for more than 2-3 days.  You have a fever and your symptoms suddenly get worse. MAKE SURE YOU:   Understand these instructions.  Will watch your condition.  Will get help right away if you are not doing well or get worse. Document Released: 03/19/2005 Document Revised: 10/21/2011 Document Reviewed: 09/10/2011 [facility] Patient Information 2015 [place], [place]. This information is not intended to replace advice given to you by your  health care provider. Make sure you discuss any questions you have with your health care provider.  Acetaminophen; Oxycodone tablets What is this medicine? ACETAMINOPHEN; OXYCODONE (a set a MEE noe fen; ox i KOE done) is a pain reliever. It is used to treat mild to moderate pain. This medicine may be used for other purposes; ask your health care provider or pharmacist if you have questions. COMMON BRAND NAME(S): Endocet, Magnacet, Narvox, Percocet, Perloxx, Primalev, Primlev, Roxicet, Xolox What should I tell my health care provider before I take this medicine? They need to know if you have any of these conditions: -brain tumor -Crohn's disease, inflammatory bowel disease, or ulcerative colitis -drug abuse or addiction -head injury -heart or circulation problems -if you often drink alcohol -kidney disease or problems going to the bathroom -liver disease -lung disease, asthma, or breathing problems -an unusual or allergic reaction to acetaminophen, oxycodone, other opioid analgesics, other medicines, foods, dyes, or preservatives -pregnant or trying to get pregnant -breast-feeding How should I use this medicine? Take this medicine by mouth with a full glass of water. Follow the directions on the prescription label. Take your medicine at regular intervals. Do not take your medicine more often than directed. Talk to your pediatrician regarding the use of this medicine in children. Special care may be needed. Patients over 81 years old may have a stronger reaction and need a smaller dose. Overdosage: If you think you have taken too much of this  medicine contact a poison control center or emergency room at once. NOTE: This medicine is only for you. Do not share this medicine with others. What if I miss a dose? If you miss a dose, take it as soon as you can. If it is almost time for your next dose, take only that dose. Do not take double or extra doses. What may interact with this  medicine? -alcohol -antihistamines -barbiturates like amobarbital, butalbital, butabarbital, methohexital, pentobarbital, phenobarbital, thiopental, and secobarbital -benztropine -drugs for bladder problems like solifenacin, trospium, oxybutynin, tolterodine, hyoscyamine, and methscopolamine -drugs for breathing problems like ipratropium and tiotropium -drugs for certain stomach or intestine problems like propantheline, homatropine methylbromide, glycopyrrolate, atropine, belladonna, and dicyclomine -general anesthetics like etomidate, ketamine, nitrous oxide, propofol, desflurane, enflurane, halothane, isoflurane, and sevoflurane -medicines for depression, anxiety, or psychotic disturbances -medicines for sleep -muscle relaxants -naltrexone -narcotic medicines (opiates) for pain -phenothiazines like perphenazine, thioridazine, chlorpromazine, mesoridazine, fluphenazine, prochlorperazine, promazine, and trifluoperazine -scopolamine -tramadol -trihexyphenidyl This list may not describe all possible interactions. Give your health care provider a list of all the medicines, herbs, non-prescription drugs, or dietary supplements you use. Also tell them if you smoke, drink alcohol, or use illegal drugs. Some items may interact with your medicine. What should I watch for while using this medicine? Tell your doctor or health care professional if your pain does not go away, if it gets worse, or if you have new or a different type of pain. You may develop tolerance to the medicine. Tolerance means that you will need a higher dose of the medication for pain relief. Tolerance is normal and is expected if you take this medicine for a long time. Do not suddenly stop taking your medicine because you may develop a severe reaction. Your body becomes used to the medicine. This does NOT mean you are addicted. Addiction is a behavior related to getting and using a drug for a non-medical reason. If you have pain, you  have a medical reason to take pain medicine. Your doctor will tell you how much medicine to take. If your doctor wants you to stop the medicine, the dose will be slowly lowered over time to avoid any side effects. You may get drowsy or dizzy. Do not drive, use machinery, or do anything that needs mental alertness until you know how this medicine affects you. Do not stand or sit up quickly, especially if you are an older patient. This reduces the risk of dizzy or fainting spells. Alcohol may interfere with the effect of this medicine. Avoid alcoholic drinks. There are different types of narcotic medicines (opiates) for pain. If you take more than one type at the same time, you may have more side effects. Give your health care provider a list of all medicines you use. Your doctor will tell you how much medicine to take. Do not take more medicine than directed. Call emergency for help if you have problems breathing. The medicine will cause constipation. Try to have a bowel movement at least every 2 to 3 days. If you do not have a bowel movement for 3 days, call your doctor or health care professional. Do not take Tylenol (acetaminophen) or medicines that have acetaminophen with this medicine. Too much acetaminophen can be very dangerous. Many nonprescription medicines contain acetaminophen. Always read the labels carefully to avoid taking more acetaminophen. What side effects may I notice from receiving this medicine? Side effects that you should report to your doctor or health care professional as soon as possible: -  allergic reactions like skin rash, itching or hives, swelling of the face, lips, or tongue -breathing difficulties, wheezing -confusion -light headedness or fainting spells -severe stomach pain -unusually weak or tired -yellowing of the skin or the whites of the eyes Side effects that usually do not require medical attention (report to your doctor or health care professional if they continue  or are bothersome): -dizziness -drowsiness -nausea -vomiting This list may not describe all possible side effects. Call your doctor for medical advice about side effects. You may report side effects to FDA at 1-800-FDA-1088. Where should I keep my medicine? Keep out of the reach of children. This medicine can be abused. Keep your medicine in a safe place to protect it from theft. Do not share this medicine with anyone. Selling or giving away this medicine is dangerous and against the law. Store at room temperature between 20 and 25 degrees C (68 and 77 degrees F). Keep container tightly closed. Protect from light. This medicine may cause accidental overdose and death if it is taken by other adults, children, or pets. Flush any unused medicine down the toilet to reduce the chance of harm. Do not use the medicine after the expiration date. NOTE: This sheet is a summary. It may not cover all possible information. If you have questions about this medicine, talk to your doctor, pharmacist, or health care provider.  2015, [employer]. (2012-09-19 13:17:35)

## 2013-08-29 NOTE — ED Notes (Signed)
Onset of "upset stomach" 1pm followed by diarrhea, now left flank pain that is radiating to testicles. No nausea now, no stools since 2am

## 2013-08-29 NOTE — ED Notes (Signed)
Dr. Glick at bedside.  

## 2013-09-04 ENCOUNTER — Emergency Department (HOSPITAL_COMMUNITY)
Admission: EM | Admit: 2013-09-04 | Discharge: 2013-09-04 | Disposition: A | Discharge status: Home / Self Care | Payer: Self-pay | Attending: Emergency Medicine | Admitting: Emergency Medicine

## 2013-09-04 ENCOUNTER — Encounter (HOSPITAL_COMMUNITY): Payer: Self-pay | Admitting: Emergency Medicine

## 2013-09-04 DIAGNOSIS — Z8619 Personal history of other infectious and parasitic diseases: Secondary | ICD-10-CM | POA: Insufficient documentation

## 2013-09-04 DIAGNOSIS — Z87442 Personal history of urinary calculi: Secondary | ICD-10-CM | POA: Insufficient documentation

## 2013-09-04 DIAGNOSIS — F17200 Nicotine dependence, unspecified, uncomplicated: Secondary | ICD-10-CM | POA: Insufficient documentation

## 2013-09-04 DIAGNOSIS — J039 Acute tonsillitis, unspecified: Secondary | ICD-10-CM

## 2013-09-04 DIAGNOSIS — Z87448 Personal history of other diseases of urinary system: Secondary | ICD-10-CM | POA: Insufficient documentation

## 2013-09-04 DIAGNOSIS — J029 Acute pharyngitis, unspecified: Secondary | ICD-10-CM | POA: Insufficient documentation

## 2013-09-04 MED ORDER — AMOXICILLIN 250 MG PO CAPS
500.0000 mg | ORAL_CAPSULE | Freq: Once | ORAL | Status: AC
  Administered 2013-09-04: 500 mg via ORAL
  Filled 2013-09-04: qty 2

## 2013-09-04 MED ORDER — AMOXICILLIN 500 MG PO CAPS: 500.0000 mg | ORAL_CAPSULE | Freq: Three times a day (TID) | ORAL | Status: DC

## 2013-09-04 MED ORDER — IBUPROFEN 600 MG PO TABS: 600.0000 mg | ORAL_TABLET | Freq: Four times a day (QID) | ORAL | Status: DC | PRN

## 2013-09-04 NOTE — ED Notes (Signed)
Pt states sore throat since waking up yesterday morning. Pt states fatigue yesterday also. NAD.

## 2013-09-04 NOTE — Discharge Instructions (Signed)

## 2013-09-04 NOTE — ED Provider Notes (Signed)
CSN: [id number]     Arrival date & time 09/04/13  7829 History   First MD Initiated Contact with Patient 09/04/13 1042     Chief Complaint  Patient presents with  . Sore Throat   Patient is a 28 y.o. male presenting with pharyngitis. The history is provided by the patient. No language interpreter was used.  Sore Throat Pertinent negatives include no chest pain, no abdominal pain and no shortness of breath.  This chart was scribed for non-physician practitioner Burgess Amor, PA-C working with Benny Lennert, MD, by Andrew Au, ED Scribe. This patient was seen in room APFT23/APFT23 and the patient's care was started at 11:18 AM.  Dale Martinez is a 28 y.o. male who presents to the Emergency Department complaining of worsening constant sore throat that began 1 day. Pt reports he woke up yesterday with a a sore throat. He reports associated body aches, mild productive cough consisting of phlegm, and congestion. He reports feeling warm last night but did not take his temperature. Pt denies taking medication.  Pt denies otalgia and rhinorrhea, also denies headache, shortness of breath and chest pain. Pt works at [employer] with possible sick contacts. Pt is a smoker. Pt denies PCP.   Past Medical History  Diagnosis Date  . Strep throat   . Renal disorder   . Kidney stones    History reviewed. No pertinent past surgical history. Family History  Problem Relation Age of Onset  . Diabetes Father    History  Substance Use Topics  . Smoking status: Current Every Day Smoker -- 1.00 packs/day    Types: Cigarettes  . Smokeless tobacco: Not on file  . Alcohol Use: No    Review of Systems  Constitutional: Positive for fever and chills.  HENT: Positive for congestion and sore throat. Negative for ear discharge, ear pain, rhinorrhea, sinus pressure, trouble swallowing and voice change.   Eyes: Negative for discharge.  Respiratory: Positive for cough. Negative for shortness of breath,  wheezing and stridor.   Cardiovascular: Negative for chest pain.  Gastrointestinal: Negative for abdominal pain.  Genitourinary: Negative.      Allergies  Review of patient's allergies indicates no known allergies.  Home Medications   Prior to Admission medications   Medication Sig Start Date End Date Taking? Authorizing Provider  amoxicillin (AMOXIL) 500 MG capsule Take 1 capsule (500 mg total) by mouth 3 (three) times daily. 09/04/13   Burgess Amor, PA-C  ibuprofen (ADVIL,MOTRIN) 600 MG tablet Take 1 tablet (600 mg total) by mouth every 6 (six) hours as needed. 09/04/13   Burgess Amor, PA-C   BP 128/99  Pulse 109  Temp(Src) 99.2 F (37.3 C) (Oral)  Resp 16  Ht 5\' 11"  (1.803 m)  Wt 205 lb (92.987 kg)  BMI 28.60 kg/m2  SpO2 100% Physical Exam  Nursing note and vitals reviewed. Constitutional: He is oriented to person, place, and time. He appears well-developed and well-nourished. No distress.  HENT:  Head: Normocephalic and atraumatic.  Right Ear: Hearing, tympanic membrane, external ear and ear canal normal.  Left Ear: Hearing, tympanic membrane, external ear and ear canal normal.  Mouth/Throat: Oropharyngeal exudate, posterior oropharyngeal edema and posterior oropharyngeal erythema present. No tonsillar abscesses.  Eyes: Conjunctivae and EOM are normal.  Neck: Neck supple.  Cardiovascular: Normal rate, regular rhythm and normal heart sounds.  Exam reveals no friction rub.   No murmur heard. Pulmonary/Chest: Effort normal and breath sounds normal. No respiratory distress. He has no wheezes.  He has no rales.  Musculoskeletal: Normal range of motion.  Lymphadenopathy:       Head (right side): Submandibular adenopathy present.       Head (left side): Submandibular adenopathy present.  Neurological: He is alert and oriented to person, place, and time.  Skin: Skin is warm and dry.  Psychiatric: He has a normal mood and affect. His behavior is normal.    ED Course  Procedures  (including critical care time) DIAGNOSTIC STUDIES: Oxygen Saturation is 100% on RA, normal by my interpretation.    COORDINATION OF CARE: 11:30 AM- Pt advised of plan for treatment which includes 10 course of amoxicillin and pt agrees.  Labs Review Labs Reviewed - No data to display  Imaging Review No results found.   EKG Interpretation None      MDM   Final diagnoses:  Acute tonsillitis   Pt with moderate hypertrophied tonsils, not touching uvula.  No stridor, no airway obstruction.  Moderate edema and exudate.  No acute distress.  Pt will be covered with amoxil, ibuprofen.  Encouraged rest, increased fluid intake,  Salt gargles, recheck for worsened sx or if sx are not improving with tx.  The patient appears reasonably screened and/or stabilized for discharge and I doubt any other medical condition or other [facility] requiring further screening, evaluation, or treatment in the ED at this time prior to discharge.  I personally performed the services described in this documentation, which was scribed in my presence. The recorded information has been reviewed and is accurate.      Burgess AmorJulie Taylen Wendland, PA-C 09/06/13 1457

## 2013-09-06 NOTE — ED Provider Notes (Signed)
Medical screening examination/treatment/procedure(s) were performed by non-physician practitioner and as supervising physician I was immediately available for consultation/collaboration.   EKG Interpretation None        Hymie Gorr L Johnaton Sonneborn, MD 09/06/13 1555 

## 2013-09-07 ENCOUNTER — Emergency Department (HOSPITAL_COMMUNITY)
Admission: EM | Admit: 2013-09-07 | Discharge: 2013-09-07 | Disposition: A | Discharge status: Home / Self Care | Payer: Self-pay | Attending: Emergency Medicine | Admitting: Emergency Medicine

## 2013-09-07 ENCOUNTER — Encounter (HOSPITAL_COMMUNITY): Payer: Self-pay | Admitting: Emergency Medicine

## 2013-09-07 DIAGNOSIS — F17200 Nicotine dependence, unspecified, uncomplicated: Secondary | ICD-10-CM | POA: Insufficient documentation

## 2013-09-07 DIAGNOSIS — Z0289 Encounter for other administrative examinations: Secondary | ICD-10-CM | POA: Insufficient documentation

## 2013-09-07 DIAGNOSIS — Z87448 Personal history of other diseases of urinary system: Secondary | ICD-10-CM | POA: Insufficient documentation

## 2013-09-07 DIAGNOSIS — Z792 Long term (current) use of antibiotics: Secondary | ICD-10-CM | POA: Insufficient documentation

## 2013-09-07 DIAGNOSIS — R112 Nausea with vomiting, unspecified: Secondary | ICD-10-CM | POA: Insufficient documentation

## 2013-09-07 DIAGNOSIS — J3489 Other specified disorders of nose and nasal sinuses: Secondary | ICD-10-CM | POA: Insufficient documentation

## 2013-09-07 DIAGNOSIS — J039 Acute tonsillitis, unspecified: Secondary | ICD-10-CM | POA: Insufficient documentation

## 2013-09-07 DIAGNOSIS — Z87442 Personal history of urinary calculi: Secondary | ICD-10-CM | POA: Insufficient documentation

## 2013-09-07 DIAGNOSIS — R1111 Vomiting without nausea: Secondary | ICD-10-CM

## 2013-09-07 DIAGNOSIS — Z8619 Personal history of other infectious and parasitic diseases: Secondary | ICD-10-CM | POA: Insufficient documentation

## 2013-09-07 MED ORDER — ONDANSETRON HCL 4 MG PO TABS: 4.0000 mg | ORAL_TABLET | Freq: Four times a day (QID) | ORAL | Status: DC

## 2013-09-07 MED ORDER — ONDANSETRON 8 MG PO TBDP
8.0000 mg | ORAL_TABLET | Freq: Once | ORAL | Status: AC
Start: 2013-09-07 — End: 2013-09-07
  Administered 2013-09-07: 8 mg via ORAL
  Filled 2013-09-07: qty 1

## 2013-09-07 NOTE — ED Provider Notes (Signed)
TIME SEEN: 2:03 AM  CHIEF COMPLAINT: Nasal drainage, one episode of nonbloody and nonbilious vomiting, requesting work note  HPI: Patient is a 28 y.o. M with history of kidney stones who was recently seen in the emergency department and treated for strep pharyngitis. He states he is given a work note to cover him until today and he went back to work [employer] and while at work had nasal drainage that caused him to vomit once. Denies any fevers, chills, abdominal pain, diarrhea. He is requesting a work note to cover him until Friday.  ROS: See HPI Constitutional: no fever  Eyes: no drainage  ENT: no runny nose   Cardiovascular:  no chest pain  Resp: no SOB  GI: no vomiting GU: no dysuria Integumentary: no rash  Allergy: no hives  Musculoskeletal: no leg swelling  Neurological: no slurred speech ROS otherwise negative  PAST MEDICAL HISTORY/PAST SURGICAL HISTORY:  Past Medical History  Diagnosis Date  . Strep throat   . Renal disorder   . Kidney stones     MEDICATIONS:  Prior to Admission medications   Medication Sig Start Date End Date Taking? Authorizing Provider  amoxicillin (AMOXIL) 500 MG capsule Take 1 capsule (500 mg total) by mouth 3 (three) times daily. 09/04/13  Yes Burgess AmorJulie Idol, PA-C  ibuprofen (ADVIL,MOTRIN) 600 MG tablet Take 1 tablet (600 mg total) by mouth every 6 (six) hours as needed. 09/04/13  Yes Burgess AmorJulie Idol, PA-C    ALLERGIES:  No Known Allergies  SOCIAL HISTORY:  History  Substance Use Topics  . Smoking status: Current Every Day Smoker -- 1.00 packs/day    Types: Cigarettes  . Smokeless tobacco: Not on file  . Alcohol Use: No    FAMILY HISTORY: Family History  Problem Relation Age of Onset  . Diabetes Father     EXAM: BP 135/90  Pulse 101  Temp(Src) 98.7 F (37.1 C) (Oral)  Resp 20  Ht 5\' 11"  (1.803 m)  Wt 205 lb (92.987 kg)  BMI 28.60 kg/m2  SpO2 99% CONSTITUTIONAL: Alert and oriented and responds appropriately to questions. Well-appearing;  well-nourished HEAD: Normocephalic EYES: Conjunctivae clear, PERRL ENT: normal nose; no rhinorrhea; moist mucous membranes; patient has bilateral tonsillar hypertrophy with exudate, no uvular deviation, no trismus or drooling, no dental caries, no Ludwig angina, no muffled voice, no stridor NECK: Supple, no meningismus, no LAD  CARD: RRR; S1 and S2 appreciated; no murmurs, no clicks, no rubs, no gallops RESP: Normal chest excursion without splinting or tachypnea; breath sounds clear and equal bilaterally; no wheezes, no rhonchi, no rales, no respiratory distress ABD/GI: Normal bowel sounds; non-distended; soft, non-tender, no rebound, no guarding BACK:  The back appears normal and is non-tender to palpation, there is no CVA tenderness EXT: Normal ROM in all joints; non-tender to palpation; no edema; normal capillary refill; no cyanosis    SKIN: Normal color for age and race; warm NEURO: Moves all extremities equally PSYCH: The patient's mood and manner are appropriate. Grooming and personal hygiene are appropriate.  MEDICAL DECISION MAKING: Patient here with one episode of vomiting likely secondary to nasal drainage. He is otherwise very well-appearing, nontoxic with benign abdominal exam. He appears to mostly be here for a work note. I feel he is safe to be discharged home. We'll discharge with prescription for Zofran to use as needed. We'll have him continue his amoxicillin for his pharyngitis. No signs of deep space neck infection or peritonsillar abscess. We'll give work note to cover him for [employer]  and tomorrow. Discussed strict return precautions and supportive care instructions. He verbalizes understanding and is comfortable plan.      Layla Maw Daniesha Driver, DO 09/07/13 [phone]

## 2013-09-07 NOTE — Discharge Instructions (Signed)

## 2013-09-07 NOTE — ED Notes (Signed)
Patient was seen here and diagnosed with strep throat and started on Amoxicillin.  Patient states he went back to work tonight and something has been draining down the back of his throat and making him vomit.

## 2013-10-19 ENCOUNTER — Emergency Department (HOSPITAL_COMMUNITY)
Admission: EM | Admit: 2013-10-19 | Discharge: 2013-10-19 | Disposition: A | Discharge status: Home / Self Care | Payer: Self-pay | Attending: Emergency Medicine | Admitting: Emergency Medicine

## 2013-10-19 ENCOUNTER — Encounter (HOSPITAL_COMMUNITY): Payer: Self-pay | Admitting: Emergency Medicine

## 2013-10-19 ENCOUNTER — Emergency Department (HOSPITAL_COMMUNITY): Payer: Self-pay

## 2013-10-19 DIAGNOSIS — F17200 Nicotine dependence, unspecified, uncomplicated: Secondary | ICD-10-CM | POA: Insufficient documentation

## 2013-10-19 DIAGNOSIS — R109 Unspecified abdominal pain: Secondary | ICD-10-CM | POA: Insufficient documentation

## 2013-10-19 DIAGNOSIS — N23 Unspecified renal colic: Secondary | ICD-10-CM

## 2013-10-19 DIAGNOSIS — Z8614 Personal history of Methicillin resistant Staphylococcus aureus infection: Secondary | ICD-10-CM | POA: Insufficient documentation

## 2013-10-19 DIAGNOSIS — R112 Nausea with vomiting, unspecified: Secondary | ICD-10-CM | POA: Insufficient documentation

## 2013-10-19 DIAGNOSIS — N201 Calculus of ureter: Secondary | ICD-10-CM | POA: Insufficient documentation

## 2013-10-19 HISTORY — DX: Calculus of kidney: N20.0

## 2013-10-19 LAB — URINALYSIS, ROUTINE W REFLEX MICROSCOPIC
BILIRUBIN URINE: NEGATIVE
Glucose, UA: NEGATIVE mg/dL
Ketones, ur: NEGATIVE mg/dL
Leukocytes, UA: NEGATIVE
NITRITE: NEGATIVE
PH: 5.5 (ref 5.0–8.0)
Protein, ur: NEGATIVE mg/dL
Specific Gravity, Urine: >1.03 — ABNORMAL HIGH (ref 1.005–1.030)
Urobilinogen, UA: 0.2 mg/dL (ref 0.0–1.0)

## 2013-10-19 LAB — URINE MICROSCOPIC-ADD ON

## 2013-10-19 MED ORDER — KETOROLAC TROMETHAMINE 60 MG/2ML IM SOLN
60.0000 mg | Freq: Once | INTRAMUSCULAR | Status: AC
  Administered 2013-10-19: 60 mg via INTRAMUSCULAR
  Filled 2013-10-19: qty 2

## 2013-10-19 MED ORDER — NAPROXEN 250 MG PO TABS: 250.0000 mg | ORAL_TABLET | Freq: Two times a day (BID) | ORAL | Status: DC

## 2013-10-19 MED ORDER — OXYCODONE-ACETAMINOPHEN 5-325 MG PO TABS: ORAL_TABLET | ORAL | Status: DC

## 2013-10-19 MED ORDER — ONDANSETRON 8 MG PO TBDP
8.0000 mg | ORAL_TABLET | Freq: Once | ORAL | Status: AC
  Administered 2013-10-19: 8 mg via ORAL
  Filled 2013-10-19: qty 1

## 2013-10-19 MED ORDER — TAMSULOSIN HCL 0.4 MG PO CAPS: 0.4000 mg | ORAL_CAPSULE | Freq: Every day | ORAL | Status: DC

## 2013-10-19 MED ORDER — ONDANSETRON HCL 4 MG PO TABS: 4.0000 mg | ORAL_TABLET | Freq: Three times a day (TID) | ORAL | Status: DC | PRN

## 2013-10-19 MED ORDER — OXYCODONE-ACETAMINOPHEN 5-325 MG PO TABS
2.0000 | ORAL_TABLET | Freq: Once | ORAL | Status: AC
  Administered 2013-10-19: 2 via ORAL
  Filled 2013-10-19: qty 2

## 2013-10-19 NOTE — Discharge Instructions (Signed)
°Emergency Department Resource Guide °1) Find a Doctor and Pay Out of Pocket °Although you won't have to find out who is covered by your insurance plan, it is a good idea to ask around and get recommendations. You will then need to call the office and see if the doctor you have chosen will accept you as a new patient and what types of options they offer for patients who are self-pay. Some doctors offer discounts or will set up payment plans for their patients who do not have insurance, but you will need to ask so you aren't surprised when you get to your appointment. ° °2) Contact Your Local Health Department °Not all health departments have doctors that can see patients for sick visits, but many do, so it is worth a call to see if yours does. If you don't know where your local health department is, you can check in your phone book. The CDC also has a tool to help you locate your state's health department, and many state websites also have listings of all of their local health departments. ° °3) Find a Walk-in Clinic °If your illness is not likely to be very severe or complicated, you may want to try a walk in clinic. These are popping up all over the country in pharmacies, drugstores, and shopping centers. They're usually staffed by nurse practitioners or physician assistants that have been trained to treat common illnesses and complaints. They're usually fairly quick and inexpensive. However, if you have serious medical issues or chronic medical problems, these are probably not your best option. ° °No Primary Care Doctor: °- Call [facility] at  832-8000 - they can help you locate a primary care doctor that  accepts your insurance, provides certain services, etc. °- Physician Referral Service- 1-800-533-3463 ° °Chronic Pain Problems: °Organization         Address  Phone   Notes  ° [facility]  (336) 297-2271 Patients need to be referred by their primary care doctor.  ° °Medication  Assistance: °Organization         Address  Phone   Notes  °[place] [facility] [address] °[place], [place] [address] (336) 641-8030 --Must be a resident of [place] °-- Must have NO insurance coverage whatsoever (no Medicaid/ Medicare, etc.) °-- The pt. MUST have a primary care doctor that directs their care regularly and follows them in the community °  °[facility]  (866) 331-1348   °[employer]  (888) 892-1162   ° °Agencies that provide inexpensive medical care: °Organization         Address  Phone   Notes  °[facility]  (336) 832-8035   °[facility]    (336) 832-7272   °[facility] [address] °[place], [place] [address] (336) 832-4777   °[facility] [address], °[place] (336) 271-4999   °[facility]    (336) 373-0678   °[facility]    (336) 272-1050   °[facility] ° [address], [place] Phone:  (336) 832-4444, Fax:  (336) 832-4440 Hours of Operation:  9 am - 6 pm, M-F.  Also accepts Medicaid/Medicare and self-pay.  °[facility] ° [address], [place] Phone: (336) 832-3150, Fax: (336) 832-3151. Hours of Operation:  8:30 am - 5:30 pm, M-F.  Also accepts Medicaid and self-pay.  °[facility] [place] [address], [place] Phone: (336) 878-6027   °[facility] [address], [place], [place] (336)723-1848, Ext. 123 Mondays & Thursdays: 7-9 AM.  First 15 patients are seen on a first come, first serve basis. °  ° °Medicaid-accepting [place] Providers: ° °Organization         Address  Phone   Notes  °[facility] [address], [place] (336) 641-2100 Also accepts self-pay patients.  °[facility] [address], [place] ° (336) 856-9996   °[facility] [address], [place]  (336) 288-8857   °[facility] [address] [place] [address], [place] (336) 299-7000   °Veita Bland [address], [place]  ° (336) 373-1557 Only accepts [place] Access Medicaid patients after they have their name applied to their card.  ° °Self-Pay (no insurance) in [place]: ° °Organization         Address  Phone   Notes  °Sickle Cell Patients, [facility] [address], [place] (336) 832-1970   °[facility] [address], [place] (336) 832-4400   °[facility] ° [address], [place] (336) 992-4800   °[facility]/Dr. Osei-Bonsu ° [address] [place] [address], [place] or [address], [place] (336) [phone] Phone number for both [place] and [place] locations is the same.  °[facility] [address], [place] (336) 299-0000   °[facility] [address] [place] [address], [place] or [address] (336) 852-7530 °(336) 878-2260   °[facility] [address], [place] (336) 350-1642, phone; (336) 294-5005, fax Sees patients 1st and 3rd Saturday of every month.  Must not qualify for public or private insurance (i.e. Medicaid, Medicare, [place] Health Choice, Veterans' Benefits) • Household income should be no more than 200% of the poverty level •The clinic cannot treat you if you are pregnant or think you are pregnant • Sexually transmitted diseases are not treated at the clinic.  ° ° °Dental Care: °Organization         Address  Phone  Notes  °[place] [facility] [address],  (336) 641-6152 Accepts children up to age 21 who are enrolled in Medicaid or [place] Health Choice; pregnant women with a Medicaid card; and children who have applied for Medicaid or [place] Health Choice, but were declined, whose parents can pay a reduced fee at time of service.  °[place]  [facility] [place]  [address], [place] (336) 641-7733 Accepts children up to age 21 who are enrolled in Medicaid or [place] Health Choice; pregnant women with a Medicaid card; and children who have applied for Medicaid or  Health Choice, but were declined, whose parents can pay a reduced fee at time of service.  °[facility] ° [address],  (336) 641-4533 Patients are seen by appointment only. Walk-ins are not accepted. [facility] will see patients 18 years of age and older. °Monday - Tuesday (8am-5pm) °Most Wednesdays (8:30-5pm) °$30 per visit, cash only  °[facility] ° [address], [place] (336) 641-4533 Patients are seen by appointment only. Walk-ins are not accepted. [facility] will see patients 18 years of age and older. °One   Wednesday Evening (Monthly: Volunteer Based).  $30 per visit, cash only  °[facility]  (919) 537-3737 for adults; Children under age 4, call Graduate Pediatric Dentistry at ([phone]) [phone]. Children aged 4-14, please call (919) [phone] to request a pediatric application. ° Dental services are provided in all areas of dental care including fillings, crowns and bridges, complete and partial dentures, implants, gum treatment, root canals, and extractions. Preventive care is also provided. Treatment is provided to both adults and children. °Patients are selected via a lottery and there is often a waiting list. °  °[facility] [address], °[place] ° (336) 763-8833 [email or web address] °  °[facility] [address], [place], [place] (336)723-1848, Ext. 123 Second and Fourth Thursday of each month, opens at 6:30 AM; Clinic ends at 9 AM.  Patients are seen on a first-come first-served basis, and a limited number are seen during each clinic.  ° °[facility] ° [address], [place], [place] (336) 723-7904    Eligibility Requirements °You must have lived in [place], [place], or [place] counties for at least the last three months. °  You cannot be eligible for state or federal sponsored healthcare insurance, including Veterans Administration, Medicaid, or Medicare. °  You generally cannot be eligible for healthcare insurance through your employer.  °  How to apply: °Eligibility screenings are held every Tuesday and Wednesday afternoon from 1:00 pm until 4:00 pm. You do not need an appointment for the interview!  °[facility] [address], [place], [place] 336-631-2330   °[place] [facility]  336-342-8273   °[place]  336-703-3100   °[facility]  336-570-6415   ° °Behavioral Health Resources in the Community: °Intensive Outpatient Programs °Organization         Address  Phone  Notes  °[facility] [address], [place], [place] 336-878-6098   °[facility] [address], [place], [place] 336-832-9800   °[facility] [address], [place], [place] ° 336-882-2125   °[facility] [address],  °[place], [place] 1-800-853-5163 or 336-641-4981   °Substance Abuse Resources °Organization         Address  Phone  Notes  °[facility]  336-882-2125   °[facility]  336-784-9470   °[facility] [place] [facility]  336-285-9073   °[facility]  336-845-3988   °Residential & Outpatient Substance Abuse Program  1-800-659-3381   °Psychological Services °Organization         Address  Phone  Notes  °[facility]  336- 832-9600   °[facility]  336- 378-7881   °[facility] [address], [place] 1-800-853-5163 or 336-641-4981   ° °Mobile Crisis Teams °Organization         Address  Phone  Notes  °[facility], Mobile Crisis Care Unit  1-877-626-1772   °Assertive °[facility] ° [address]  [place], [place] 336-834-9664   °Sharon DeEsch [address] °[place] 336-554-5454   ° °Self-Help/Support Groups °Organization         Address  Phone             Notes  °[facility]  - variety of support groups  336- 373-1402 Call for more information  °Narcotics Anonymous (NA), [facility] [address], °[place]  2 meetings at this location  ° °  Residential Treatment Programs °Organization         Address  Phone  Notes  °[facility] [address],    °[place]  1-866-801-8205   °[facility] ° [address], [place], [place] 704-293-8524   °[facility] [address], [place] 336-845-3988 Admissions: 8am-3pm M-F  °[facility] [address]    °[place], [place] 336-841-1104   °[facility] [address], [place], [place] 336-379-7146   °[facility] [place] [facility] [address]  °[place], [place] 336-285-9073   °[facility] Intensive Outpatient [address], [place], [place] 336-852-3033   °[facility] [address]  °[place], [place] 1-877-615-2722 or 336-784-9470   °[facility] ([facility] [address] [place], [place] 336-227-7417 Accepts Medicaid  °[facility] [address]  °[place] 1-800-659-3381 Substance Abuse/Addiction Treatment  ° °[place] Behavioral Health Resources °Organization         Address  Phone  Notes  °[facility]  (888) 581-9988   °Julie Brannon, PhD [address], [place]   (336) 349-5553 or (336) 951-0000   ° Behavioral   [address] °[place], [place] (336) 349-4454   °[facility] [address], [place], [place] (336) 342-8316 Insurance/Medicaid/sponsorship through [facility]  °[facility] [address]                                    [place], [place] (336) 342-8316 Therapy/tele-psych/case    °[facility] [address]  ° [place], [place] (336) 349-2233    °Dr. Arfeen  (336) 349-4544   °[facility] [place]  [employer] [place] [facility] 1) [address], [place] °2) [address], [place] °3)  [address], [place] (336) 349-3220 °(336) 342-7768 ° °(336) 342-8140   °[place] [facility] (336) 342-1394 or (336) 342-3537 (After Hours)    ° ° °Take the prescriptions as directed. Call the Urologist tomorrow to schedule a follow up appointment within the next week.  Return to the Emergency Department immediately if worsening. ° °

## 2013-10-19 NOTE — ED Notes (Signed)
Lt flank pain , onset 1 pm, vomited x1 pta.  Alert, hx of kidney stones.

## 2013-10-19 NOTE — ED Provider Notes (Signed)
CSN: [id number]     Arrival date & time 10/19/13  1545 History   First MD Initiated Contact with Patient 10/19/13 1815     Chief Complaint  Patient presents with  . Flank Pain     HPI Pt was seen at 1825. Per pt, c/o sudden onset and persistence of waxing and waning left sided flank "pain" that began this afternoon approximately 1300 PTA.  Pt describes the pain as "like my last kidney stone," and radiating into the left side of his abd.  Has been associated with one episode of N/V.  Denies testicular pain/swelling, no dysuria/hematuria, no abd pain, no diarrhea, no black or blood in emesis, no CP/SOB.    Past Medical History  Diagnosis Date  . Strep throat   . Kidney stone    History reviewed. No pertinent past surgical history.  Family History  Problem Relation Age of Onset  . Diabetes Father    History  Substance Use Topics  . Smoking status: Current Every Day Smoker -- 1.00 packs/day    Types: Cigarettes  . Smokeless tobacco: Not on file  . Alcohol Use: No    Review of Systems ROS: Statement: All systems negative except as marked or noted in the HPI; Constitutional: Negative for fever and chills. ; ; Eyes: Negative for eye pain, redness and discharge. ; ; ENMT: Negative for ear pain, hoarseness, nasal congestion, sinus pressure and sore throat. ; ; Cardiovascular: Negative for chest pain, palpitations, diaphoresis, dyspnea and peripheral edema. ; ; Respiratory: Negative for cough, wheezing and stridor. ; ; Gastrointestinal: +N/V. Negative for diarrhea, abdominal pain, blood in stool, hematemesis, jaundice and rectal bleeding. . ; ; Genitourinary: +flank pain. Negative for dysuria and hematuria. ; ; Genital:  No penile drainage or rash, no testicular pain or swelling, no scrotal rash or swelling. ;; Musculoskeletal: Negative for back pain and neck pain. Negative for swelling and trauma.; ; Skin: Negative for pruritus, rash, abrasions, blisters, bruising and skin lesion.; ; Neuro:  Negative for headache, lightheadedness and neck stiffness. Negative for weakness, altered level of consciousness , altered mental status, extremity weakness, paresthesias, involuntary movement, seizure and syncope.        Allergies  Review of patient's allergies indicates no known allergies.  Home Medications   Prior to Admission medications   Not on File   BP 125/91  Pulse 76  Temp(Src) 98.1 F (36.7 C) (Oral)  Resp 20  Ht  (1.803 m)  Wt 205 lb (92.987 kg)  BMI 28.60 kg/m2  SpO2 99% Physical Exam 1830; Physical examination:  Nursing notes reviewed; Vital signs and O2 SAT reviewed;  Constitutional: Well developed, Well nourished, Well hydrated, In no acute distress; Head:  Normocephalic, atraumatic; Eyes: EOMI, PERRL, No scleral icterus; ENMT: Mouth and pharynx normal, Mucous membranes moist; Neck: Supple, Full range of motion, No lymphadenopathy; Cardiovascular: Regular rate and rhythm, No murmur, rub, or gallop; Respiratory: Breath sounds clear & equal bilaterally, No rales, rhonchi, wheezes.  Speaking full sentences with ease, Normal respiratory effort/excursion; Chest: Nontender, Movement normal; Abdomen: Soft, Nontender, Nondistended, Normal bowel sounds; Genitourinary: No CVA tenderness; Spine:  No midline CS, TS, LS tenderness. +TTP left lumbar paraspinal muscles. No rash.;; Extremities: Pulses normal, No tenderness, No edema, No calf edema or asymmetry.; Neuro: AA&Ox3, Major CN grossly intact.  Speech clear. No gross focal motor or sensory deficits in extremities. Climbs on and off stretcher easily by himself. Gait steady.; Skin: Color normal, Warm, Dry.   ED Course  Procedures  MDM  MDM Reviewed: previous chart, nursing note and vitals Reviewed previous: labs Interpretation: labs and CT scan    Results for orders placed during the hospital encounter of 10/19/13  URINALYSIS, ROUTINE W REFLEX MICROSCOPIC      Result Value Ref Range   Color, Urine YELLOW   YELLOW   APPearance CLEAR  CLEAR   Specific Gravity, Urine >1.030 (*) 1.005 - 1.030   pH 5.5  5.0 - 8.0   Glucose, UA NEGATIVE  NEGATIVE mg/dL   Hgb urine dipstick TRACE (*) NEGATIVE   Bilirubin Urine NEGATIVE  NEGATIVE   Ketones, ur NEGATIVE  NEGATIVE mg/dL   Protein, ur NEGATIVE  NEGATIVE mg/dL   Urobilinogen, UA 0.2  0.0 - 1.0 mg/dL   Nitrite NEGATIVE  NEGATIVE   Leukocytes, UA NEGATIVE  NEGATIVE  URINE MICROSCOPIC-ADD ON      Result Value Ref Range   Squamous Epithelial / LPF FEW (*) RARE   WBC, UA 3-6  <3 WBC/hpf   RBC / HPF 0-2  <3 RBC/hpf   Bacteria, UA RARE  RARE   Urine-Other MUCOUS PRESENT     Ct Abdomen Pelvis Wo Contrast 10/19/2013   CLINICAL DATA:  Left flank pain, history of kidney stones  EXAM: CT ABDOMEN AND PELVIS WITHOUT CONTRAST  TECHNIQUE: Multidetector CT imaging of the abdomen and pelvis was performed following the standard protocol without IV contrast.  COMPARISON:  08/29/2013  FINDINGS: Lung bases are free of acute infiltrate or sizable effusion. The liver, gallbladder, adrenal glands, spleen and pancreas are within normal limits. Kidneys are well visualized bilaterally and reveal nonobstructing stones on the left. There is been migration of a small 4.4 mm stone into the ureteral pelvic junction without significant dilatation of the collecting system. The more distal left ureter is within normal limits. The bladder is well distended.  The appendix is unremarkable. Scattered diverticular change of the colon is noted without evidence of diverticulitis. No free pelvic fluid is seen. No pelvic mass lesion or sidewall abnormality is noted. The osseous structures show no acute abnormality. No significant lymphadenopathy is seen. Stable retroperitoneal lymph nodes with normal fatty hila are again seen.  IMPRESSION: Migration of one of the previously seen left renal calculi into the left UPJ without significant obstructive changes.   Electronically Signed   By: Alcide Clever M.D.    On: 10/19/2013 18:53    1925:  Improved after meds, wants to go home now. Dx and testing d/w pt.  Questions answered.  Verb understanding, agreeable to d/c home with outpt f/u.   Samuel Jester, DO 10/20/13 1548

## 2013-10-28 ENCOUNTER — Emergency Department (HOSPITAL_COMMUNITY)
Admission: EM | Admit: 2013-10-28 | Discharge: 2013-10-28 | Disposition: A | Discharge status: Home / Self Care | Payer: Self-pay | Attending: Emergency Medicine | Admitting: Emergency Medicine

## 2013-10-28 ENCOUNTER — Encounter (HOSPITAL_COMMUNITY): Payer: Self-pay | Admitting: Emergency Medicine

## 2013-10-28 DIAGNOSIS — N23 Unspecified renal colic: Secondary | ICD-10-CM

## 2013-10-28 DIAGNOSIS — R319 Hematuria, unspecified: Secondary | ICD-10-CM

## 2013-10-28 DIAGNOSIS — R109 Unspecified abdominal pain: Secondary | ICD-10-CM | POA: Insufficient documentation

## 2013-10-28 DIAGNOSIS — Z8619 Personal history of other infectious and parasitic diseases: Secondary | ICD-10-CM | POA: Insufficient documentation

## 2013-10-28 DIAGNOSIS — N200 Calculus of kidney: Secondary | ICD-10-CM | POA: Insufficient documentation

## 2013-10-28 DIAGNOSIS — Z7982 Long term (current) use of aspirin: Secondary | ICD-10-CM | POA: Insufficient documentation

## 2013-10-28 DIAGNOSIS — F17200 Nicotine dependence, unspecified, uncomplicated: Secondary | ICD-10-CM | POA: Insufficient documentation

## 2013-10-28 LAB — CBC WITH DIFFERENTIAL/PLATELET
BASOS PCT: 0 % (ref 0–1)
Basophils Absolute: 0.1 10*3/uL (ref 0.0–0.1)
Eosinophils Absolute: 0.7 10*3/uL (ref 0.0–0.7)
Eosinophils Relative: 4 % (ref 0–5)
HEMATOCRIT: 42.9 % (ref 39.0–52.0)
Hemoglobin: 14.4 g/dL (ref 13.0–17.0)
Lymphocytes Relative: 18 % (ref 12–46)
Lymphs Abs: 2.7 10*3/uL (ref 0.7–4.0)
MCH: 30.4 pg (ref 26.0–34.0)
MCHC: 33.6 g/dL (ref 30.0–36.0)
MCV: 90.7 fL (ref 78.0–100.0)
MONO ABS: 1 10*3/uL (ref 0.1–1.0)
Monocytes Relative: 7 % (ref 3–12)
Neutro Abs: 10.5 10*3/uL — ABNORMAL HIGH (ref 1.7–7.7)
Neutrophils Relative %: 71 % (ref 43–77)
Platelets: 225 10*3/uL (ref 150–400)
RBC: 4.73 MIL/uL (ref 4.22–5.81)
RDW: 13.4 % (ref 11.5–15.5)
WBC: 14.9 10*3/uL — ABNORMAL HIGH (ref 4.0–10.5)

## 2013-10-28 LAB — URINALYSIS, ROUTINE W REFLEX MICROSCOPIC
Bilirubin Urine: NEGATIVE
GLUCOSE, UA: NEGATIVE mg/dL
KETONES UR: NEGATIVE mg/dL
LEUKOCYTES UA: NEGATIVE
Nitrite: NEGATIVE
PROTEIN: NEGATIVE mg/dL
Specific Gravity, Urine: 1.01 (ref 1.005–1.030)
UROBILINOGEN UA: 0.2 mg/dL (ref 0.0–1.0)
pH: 6.5 (ref 5.0–8.0)

## 2013-10-28 LAB — URINE MICROSCOPIC-ADD ON

## 2013-10-28 LAB — BASIC METABOLIC PANEL
Anion gap: 10 (ref 5–15)
BUN: 10 mg/dL (ref 6–23)
CO2: 24 mEq/L (ref 19–32)
CREATININE: 0.71 mg/dL (ref 0.50–1.35)
Calcium: 8.5 mg/dL (ref 8.4–10.5)
Chloride: 105 mEq/L (ref 96–112)
GFR calc Af Amer: >90 mL/min (ref >90)
GFR calc non Af Amer: >90 mL/min (ref >90)
Glucose, Bld: 103 mg/dL — ABNORMAL HIGH (ref 70–99)
Potassium: 4 mEq/L (ref 3.7–5.3)
Sodium: 139 mEq/L (ref 137–147)

## 2013-10-28 MED ORDER — OXYCODONE-ACETAMINOPHEN 5-325 MG PO TABS: 1.0000 | ORAL_TABLET | ORAL | Status: DC | PRN

## 2013-10-28 MED ORDER — PROMETHAZINE HCL 25 MG PO TABS: 25.0000 mg | ORAL_TABLET | Freq: Four times a day (QID) | ORAL | Status: DC | PRN

## 2013-10-28 MED ORDER — ONDANSETRON HCL 4 MG/2ML IJ SOLN
4.0000 mg | Freq: Once | INTRAMUSCULAR | Status: AC
  Administered 2013-10-28: 4 mg via INTRAVENOUS
  Filled 2013-10-28: qty 2

## 2013-10-28 MED ORDER — KETOROLAC TROMETHAMINE 30 MG/ML IJ SOLN
30.0000 mg | Freq: Once | INTRAMUSCULAR | Status: AC
  Administered 2013-10-28: 30 mg via INTRAVENOUS
  Filled 2013-10-28: qty 1

## 2013-10-28 MED ORDER — SODIUM CHLORIDE 0.9 % IV SOLN
INTRAVENOUS | Status: AC
  Administered 2013-10-28: 11:00:00 via INTRAVENOUS

## 2013-10-28 MED ORDER — HYDROMORPHONE HCL 1 MG/ML IJ SOLN
1.0000 mg | Freq: Once | INTRAMUSCULAR | Status: AC
  Administered 2013-10-28: 1 mg via INTRAVENOUS
  Filled 2013-10-28: qty 1

## 2013-10-28 NOTE — Progress Notes (Signed)
Patient has received referral resource information from CM in ED back in June. No resource information given.

## 2013-10-28 NOTE — ED Provider Notes (Signed)
CSN: [id number]     Arrival date & time 10/28/13  0934 History   First MD Initiated Contact with Patient 10/28/13 [phone]     Chief Complaint  Patient presents with  . Groin Pain     (Consider location/radiation/quality/duration/timing/severity/associated sxs/prior Treatment) Patient is a 28 y.o. male presenting with groin pain. The history is provided by the patient.  Groin Pain This is a recurrent problem. The problem occurs constantly. The problem has been gradually worsening.   Dale Martinez is a 28 y.o. male who presents to the ED with left flank and groin pain. He was here 10/19/13 and diagnosed with a kidney stone. He was told the stone was moving down near the bladder. He was feeling better until this morning about 7 am when he began having pain on his left side again that radiates from the left flank to the left groin and into the left scrotal area. He rates the pain as 9/10. The pain is constant. He has slight nausea but no vomiting.  Past Medical History  Diagnosis Date  . Strep throat   . Kidney stone    History reviewed. No pertinent past surgical history. Family History  Problem Relation Age of Onset  . Diabetes Father    History  Substance Use Topics  . Smoking status: Current Every Day Smoker -- 1.00 packs/day    Types: Cigarettes  . Smokeless tobacco: Not on file  . Alcohol Use: No    Review of Systems Negative except as stated in HPI   Allergies  Review of patient's allergies indicates no known allergies.  Home Medications   Prior to Admission medications   Medication Sig Start Date End Date Taking? Authorizing Provider  aspirin 325 MG tablet Take 325-650 mg by mouth daily as needed for moderate pain.   Yes Historical Provider, MD   BP 141/88  Pulse 88  Temp(Src) 97.6 F (36.4 C) (Oral)  Resp 20  Ht  (1.803 m)  Wt 205 lb (92.987 kg)  BMI 28.60 kg/m2  SpO2 100% Physical Exam  Nursing note and vitals reviewed. Constitutional: He is oriented  to person, place, and time. He appears well-developed and well-nourished.  Eyes: Conjunctivae and EOM are normal.  Neck: Neck supple.  Cardiovascular: Normal rate.   Pulmonary/Chest: Effort normal.  Abdominal: Soft. There is tenderness in the left lower quadrant. There is no rebound and no guarding. CVA tenderness: left flank pain.  Musculoskeletal: Normal range of motion.  Neurological: He is alert and oriented to person, place, and time. No cranial nerve deficit.  Skin: Skin is warm and dry.  Psychiatric: He has a normal mood and affect. His behavior is normal.    ED Course  Procedures (including critical care time) Labs, IV fluids, Toradol 30 mg IV, Zofran 4 mg IV, Dilaudid 1 mg IV  Labs Review Results for orders placed during the hospital encounter of 10/28/13 (from the past 24 hour(s))  URINALYSIS, ROUTINE W REFLEX MICROSCOPIC     Status: Abnormal   Collection Time    10/28/13 10:06 AM      Result Value Ref Range   Color, Urine YELLOW  YELLOW   APPearance CLEAR  CLEAR   Specific Gravity, Urine 1.010  1.005 - 1.030   pH 6.5  5.0 - 8.0   Glucose, UA NEGATIVE  NEGATIVE mg/dL   Hgb urine dipstick LARGE (*) NEGATIVE   Bilirubin Urine NEGATIVE  NEGATIVE   Ketones, ur NEGATIVE  NEGATIVE mg/dL   Protein,  ur NEGATIVE  NEGATIVE mg/dL   Urobilinogen, UA 0.2  0.0 - 1.0 mg/dL   Nitrite NEGATIVE  NEGATIVE   Leukocytes, UA NEGATIVE  NEGATIVE  URINE MICROSCOPIC-ADD ON     Status: Abnormal   Collection Time    10/28/13 10:06 AM      Result Value Ref Range   Squamous Epithelial / LPF RARE  RARE   WBC, UA 0-2  <3 WBC/hpf   RBC / HPF 11-20  <3 RBC/hpf   Bacteria, UA FEW (*) RARE  CBC WITH DIFFERENTIAL     Status: Abnormal   Collection Time    10/28/13 10:35 AM      Result Value Ref Range   WBC 14.9 (*) 4.0 - 10.5 K/uL   RBC 4.73  4.22 - 5.81 MIL/uL   Hemoglobin 14.4  13.0 - 17.0 g/dL   HCT 16.1  09.6 - 04.5 %   MCV 90.7  78.0 - 100.0 fL   MCH 30.4  26.0 - 34.0 pg   MCHC 33.6   30.0 - 36.0 g/dL   RDW 40.9  81.1 - 91.4 %   Platelets 225  150 - 400 K/uL   Neutrophils Relative % 71  43 - 77 %   Neutro Abs 10.5 (*) 1.7 - 7.7 K/uL   Lymphocytes Relative 18  12 - 46 %   Lymphs Abs 2.7  0.7 - 4.0 K/uL   Monocytes Relative 7  3 - 12 %   Monocytes Absolute 1.0  0.1 - 1.0 K/uL   Eosinophils Relative 4  0 - 5 %   Eosinophils Absolute 0.7  0.0 - 0.7 K/uL   Basophils Relative 0  0 - 1 %   Basophils Absolute 0.1  0.0 - 0.1 K/uL  BASIC METABOLIC PANEL     Status: Abnormal   Collection Time    10/28/13 10:35 AM      Result Value Ref Range   Sodium 139  137 - 147 mEq/L   Potassium 4.0  3.7 - 5.3 mEq/L   Chloride 105  96 - 112 mEq/L   CO2 24  19 - 32 mEq/L   Glucose, Bld 103 (*) 70 - 99 mg/dL   BUN 10  6 - 23 mg/dL   Creatinine, Ser 7.82  0.50 - 1.35 mg/dL   Calcium 8.5  8.4 - 95.6 mg/dL   GFR calc non Af Amer >90  >90 mL/min   GFR calc Af Amer >90  >90 mL/min   Anion gap 10  5 - 15    MDM  @ 12:24 pm re eval: patient feeling much better after medication and IV fluids Stable for discharge. Patient states he still has plenty of Flomax but out of pain medication and medication for nausea. Discussed with the patient clinical and lab findings and plan of care. and all questioned fully answered. He will follow up with urology as planned.  He will return here if any problems arise.     [place], [place] 10/28/13 [phone]

## 2013-10-28 NOTE — ED Notes (Signed)
Pt c/o pain in left side radiating into left groin since 7 am.  Says is hurting in his testicle also.  Reports was recently diagnosed with kidney stones.

## 2013-10-30 NOTE — ED Provider Notes (Signed)
Medical screening examination/treatment/procedure(s) were performed by non-physician practitioner and as supervising physician I was immediately available for consultation/collaboration.   EKG Interpretation None        Tammie Yanda L Meghan Warshawsky, MD 10/30/13 0828 

## 2013-11-14 ENCOUNTER — Emergency Department (HOSPITAL_COMMUNITY): Payer: Self-pay

## 2013-11-14 ENCOUNTER — Emergency Department (HOSPITAL_COMMUNITY)
Admission: EM | Admit: 2013-11-14 | Discharge: 2013-11-14 | Disposition: A | Discharge status: Home / Self Care | Payer: Self-pay | Attending: Emergency Medicine | Admitting: Emergency Medicine

## 2013-11-14 ENCOUNTER — Encounter (HOSPITAL_COMMUNITY): Payer: Self-pay | Admitting: Emergency Medicine

## 2013-11-14 DIAGNOSIS — Z8619 Personal history of other infectious and parasitic diseases: Secondary | ICD-10-CM | POA: Insufficient documentation

## 2013-11-14 DIAGNOSIS — Z720 Tobacco use: Secondary | ICD-10-CM | POA: Insufficient documentation

## 2013-11-14 DIAGNOSIS — R109 Unspecified abdominal pain: Secondary | ICD-10-CM | POA: Insufficient documentation

## 2013-11-14 DIAGNOSIS — Z87442 Personal history of urinary calculi: Secondary | ICD-10-CM | POA: Insufficient documentation

## 2013-11-14 DIAGNOSIS — Z7982 Long term (current) use of aspirin: Secondary | ICD-10-CM | POA: Insufficient documentation

## 2013-11-14 LAB — URINALYSIS, ROUTINE W REFLEX MICROSCOPIC
BILIRUBIN URINE: NEGATIVE
Glucose, UA: NEGATIVE mg/dL
Ketones, ur: NEGATIVE mg/dL
Leukocytes, UA: NEGATIVE
Nitrite: NEGATIVE
Protein, ur: NEGATIVE mg/dL
SPECIFIC GRAVITY, URINE: 1.015 (ref 1.005–1.030)
Urobilinogen, UA: 0.2 mg/dL (ref 0.0–1.0)
pH: 6 (ref 5.0–8.0)

## 2013-11-14 LAB — URINE MICROSCOPIC-ADD ON

## 2013-11-14 MED ORDER — KETOROLAC TROMETHAMINE 30 MG/ML IJ SOLN
30.0000 mg | Freq: Once | INTRAMUSCULAR | Status: AC
  Administered 2013-11-14: 30 mg via INTRAVENOUS
  Filled 2013-11-14: qty 1

## 2013-11-14 MED ORDER — MORPHINE SULFATE 4 MG/ML IJ SOLN
4.0000 mg | Freq: Once | INTRAMUSCULAR | Status: AC
  Administered 2013-11-14: 4 mg via INTRAVENOUS
  Filled 2013-11-14: qty 1

## 2013-11-14 MED ORDER — PROMETHAZINE HCL 25 MG PO TABS: 25.0000 mg | ORAL_TABLET | Freq: Four times a day (QID) | ORAL | Status: DC | PRN

## 2013-11-14 MED ORDER — SODIUM CHLORIDE 0.9 % IV BOLUS (SEPSIS)
500.0000 mL | Freq: Once | INTRAVENOUS | Status: AC
  Administered 2013-11-14: 500 mL via INTRAVENOUS

## 2013-11-14 MED ORDER — OXYCODONE-ACETAMINOPHEN 5-325 MG PO TABS: 2.0000 | ORAL_TABLET | ORAL | Status: DC | PRN

## 2013-11-14 NOTE — ED Notes (Signed)
Pt verbalized understanding of no driving after taking pain or nausea meds due to meds cause drowsiness, also made aware percocet causes constipation as well

## 2013-11-14 NOTE — Discharge Instructions (Signed)
Medication for pain and nausea. Return if worse

## 2013-11-14 NOTE — ED Notes (Signed)
Patient states about 1300 yesterday he begin to have Flank and Lower back pain. Patient states, " I think its a kidney stone" He states he has a hx of kidney stones last one being a month ago. Patient states he took Asprin yesterday and did not get any relief. Patient states patient worsens when laying a relived when standing.

## 2013-11-14 NOTE — ED Provider Notes (Signed)
CSN: [id number]     Arrival date & time 11/14/13  [id number] History   This chart was scribed for Dale HutchingBrian Havard Radigan, MD, by Yevette EdwardsAngela Bracken, ED Scribe. This patient was seen in room APA08/APA08 and the patient's care was started at 7:13 AM.  First MD Initiated Contact with Patient 11/14/13 0703     Chief Complaint  Patient presents with  . Flank Pain    The history is provided by the patient. No language interpreter was used.   HPI Comments: Dale FarrierZachary R Martinez is a 28 y.o. male, with a h/o renal calculi, who presents to the Emergency Department complaining of intermittent left flank pain which began yesterday and which radiates to his left groin. He rates the pain as 7/10 stating the pain is increased with a supine position and relieved with movement. He also endorses mild testicular pain.  He has treated the pain with Aspirin without resolution. The pt endorses mild burning dysuria and urinary frequency. He denies hematuria.  The pt reports a h/o multiple stones, and he reports the current symptoms are similar to previous episodes of renal calculi.  He reports a h/o several CT scans. The pt was treated in the ED several weeks ago, on 9/19 and 9/10,  for pain associated with renal calculi.  Past Medical History  Diagnosis Date  . Strep throat   . Kidney stone    History reviewed. No pertinent past surgical history. Family History  Problem Relation Age of Onset  . Diabetes Father    History  Substance Use Topics  . Smoking status: Current Every Day Smoker -- 1.00 packs/day    Types: Cigarettes  . Smokeless tobacco: Not on file  . Alcohol Use: No    Review of Systems  A complete 10 system review of systems was obtained, and all systems were negative except where indicated in the HPI and PE.    Allergies  Review of patient's allergies indicates no known allergies.  Home Medications   Prior to Admission medications   Medication Sig Start Date End Date Taking? Authorizing Provider  aspirin 325  MG tablet Take 325-650 mg by mouth daily as needed for moderate pain.    Historical Provider, MD  oxyCODONE-acetaminophen (PERCOCET) 5-325 MG per tablet Take 2 tablets by mouth every 4 (four) hours as needed. 11/14/13   Dale HutchingBrian Lynell Greenhouse, MD  promethazine (PHENERGAN) 25 MG tablet Take 1 tablet (25 mg total) by mouth every 6 (six) hours as needed. 11/14/13   Dale HutchingBrian Neytiri Asche, MD   Triage Vitals: BP 126/75  Pulse 95  Temp(Src) 97.6 F (36.4 C) (Oral)  Resp 15  Ht 5\' 11"  (1.803 m)  Wt 205 lb (92.987 kg)  BMI 28.60 kg/m2  SpO2 98%  Physical Exam  Nursing note and vitals reviewed. Constitutional: He is oriented to person, place, and time. He appears well-developed and well-nourished.  HENT:  Head: Normocephalic and atraumatic.  Eyes: Conjunctivae and EOM are normal. Pupils are equal, round, and reactive to light.  Neck: Normal range of motion. Neck supple.  Cardiovascular: Normal rate, regular rhythm and normal heart sounds.   Pulmonary/Chest: Effort normal and breath sounds normal.  Abdominal: Soft. Bowel sounds are normal. There is tenderness.  Minimal left flank tenderness.   Musculoskeletal: Normal range of motion.  Neurological: He is alert and oriented to person, place, and time.  Skin: Skin is warm and dry.  Psychiatric: He has a normal mood and affect. His behavior is normal.    ED Course  Procedures (  including critical care time)  DIAGNOSTIC STUDIES: Oxygen Saturation is 98% on room air, normal by my interpretation.    COORDINATION OF CARE:  7:17 AM- Discussed treatment plan with patient, and the patient agreed to the plan. The plan includes medication.   Labs Review Labs Reviewed  URINALYSIS, ROUTINE W REFLEX MICROSCOPIC - Abnormal; Notable for the following:    Hgb urine dipstick SMALL (*)    All other components within normal limits  URINE MICROSCOPIC-ADD ON    Imaging Review Dg Abd 1 View  11/14/2013   CLINICAL DATA:  Left flank pain.  History of kidney stone left side   EXAM: ABDOMEN - 1 VIEW  COMPARISON:  CT abdomen pelvis 10/19/2013  FINDINGS: Recent CT demonstrated a small stone in the proximal left ureter. This is no longer visualized and may have passed. Correlate with symptoms. No renal calculi identified. Small phlebolith in the left pelvis.  Moderate stool in the colon without bowel obstruction.  IMPRESSION: Proximal left ureteral calculus no longer visualized.   Electronically Signed   By: Marlan Palau M.D.   On: 11/14/2013 08:25     EKG Interpretation None      MDM   Final diagnoses:  Left flank pain    Patient appears in no acute distress. Urinalysis shows a small amount of hemoglobin, however acute abdominal series is negative for stone. Discharge medications Percocet and Phenergan 25 mg  I personally performed the services described in this documentation, which was scribed in my presence. The recorded information has been reviewed and is accurate.    Dale Hutching, MD 11/14/13 1140

## 2013-12-04 ENCOUNTER — Encounter (HOSPITAL_COMMUNITY): Payer: Self-pay | Admitting: Emergency Medicine

## 2013-12-04 ENCOUNTER — Emergency Department (HOSPITAL_COMMUNITY)
Admission: EM | Admit: 2013-12-04 | Discharge: 2013-12-04 | Disposition: A | Discharge status: Home / Self Care | Payer: Self-pay | Attending: Emergency Medicine | Admitting: Emergency Medicine

## 2013-12-04 DIAGNOSIS — S29012A Strain of muscle and tendon of back wall of thorax, initial encounter: Secondary | ICD-10-CM | POA: Insufficient documentation

## 2013-12-04 DIAGNOSIS — S46812A Strain of other muscles, fascia and tendons at shoulder and upper arm level, left arm, initial encounter: Secondary | ICD-10-CM

## 2013-12-04 DIAGNOSIS — G8929 Other chronic pain: Secondary | ICD-10-CM | POA: Insufficient documentation

## 2013-12-04 DIAGNOSIS — Y939 Activity, unspecified: Secondary | ICD-10-CM | POA: Insufficient documentation

## 2013-12-04 DIAGNOSIS — Z7982 Long term (current) use of aspirin: Secondary | ICD-10-CM | POA: Insufficient documentation

## 2013-12-04 DIAGNOSIS — Z720 Tobacco use: Secondary | ICD-10-CM | POA: Insufficient documentation

## 2013-12-04 DIAGNOSIS — Z8709 Personal history of other diseases of the respiratory system: Secondary | ICD-10-CM | POA: Insufficient documentation

## 2013-12-04 DIAGNOSIS — X58XXXA Exposure to other specified factors, initial encounter: Secondary | ICD-10-CM | POA: Insufficient documentation

## 2013-12-04 DIAGNOSIS — Z79899 Other long term (current) drug therapy: Secondary | ICD-10-CM | POA: Insufficient documentation

## 2013-12-04 DIAGNOSIS — Y929 Unspecified place or not applicable: Secondary | ICD-10-CM | POA: Insufficient documentation

## 2013-12-04 DIAGNOSIS — Z87442 Personal history of urinary calculi: Secondary | ICD-10-CM | POA: Insufficient documentation

## 2013-12-04 MED ORDER — IBUPROFEN 800 MG PO TABS: 800.0000 mg | ORAL_TABLET | Freq: Three times a day (TID) | ORAL | Status: DC | PRN

## 2013-12-04 MED ORDER — CYCLOBENZAPRINE HCL 5 MG PO TABS: 5.0000 mg | ORAL_TABLET | Freq: Three times a day (TID) | ORAL | Status: DC | PRN

## 2013-12-04 NOTE — Discharge Instructions (Signed)
Muscle Cramps and Spasms Muscle cramps and spasms occur when a muscle or muscles tighten and you have no control over this tightening (involuntary muscle contraction). They are a common problem and can develop in any muscle. The most common place is in the calf muscles of the leg. Both muscle cramps and muscle spasms are involuntary muscle contractions, but they also have differences:   Muscle cramps are sporadic and painful. They may last a few seconds to a quarter of an hour. Muscle cramps are often more forceful and last longer than muscle spasms.  Muscle spasms may or may not be painful. They may also last just a few seconds or much longer. CAUSES  It is uncommon for cramps or spasms to be due to a serious underlying problem. In many cases, the cause of cramps or spasms is unknown. Some common causes are:   Overexertion.   Overuse from repetitive motions (doing the same thing over and over).   Remaining in a certain position for a long period of time.   Improper preparation, form, or technique while performing a sport or activity.   Dehydration.   Injury.   Side effects of some medicines.   Abnormally low levels of the salts and ions in your blood (electrolytes), especially potassium and calcium. This could happen if you are taking water pills (diuretics) or you are pregnant.  Some underlying medical problems can make it more likely to develop cramps or spasms. These include, but are not limited to:   Diabetes.   Parkinson disease.   Hormone disorders, such as thyroid problems.   Alcohol abuse.   Diseases specific to muscles, joints, and bones.   Blood vessel disease where not enough blood is getting to the muscles.  HOME CARE INSTRUCTIONS   Stay well hydrated. Drink enough water and fluids to keep your urine clear or pale yellow.  It may be helpful to massage, stretch, and relax the affected muscle.  For tight or tense muscles, use a warm towel, heating  pad, or hot shower water directed to the affected area.  If you are sore or have pain after a cramp or spasm, applying ice to the affected area may relieve discomfort.  Put ice in a plastic bag.  Place a towel between your skin and the bag.  Leave the ice on for 15-20 minutes, 03-04 times a day.  Medicines used to treat a known cause of cramps or spasms may help reduce their frequency or severity. Only take over-the-counter or prescription medicines as directed by your caregiver. SEEK MEDICAL CARE IF:  Your cramps or spasms get more severe, more frequent, or do not improve over time.  MAKE SURE YOU:   Understand these instructions.  Will watch your condition.  Will get help right away if you are not doing well or get worse. Document Released: 07/18/2001 Document Revised: 05/23/2012 Document Reviewed: 01/13/2012 [facility] Patient Information 2015 [place], [place]. This information is not intended to replace advice given to you by your health care provider. Make sure you discuss any questions you have with your health care provider.   Muscle Strain A muscle strain is an injury that occurs when a muscle is stretched beyond its normal length. Usually a small number of muscle fibers are torn when this happens. Muscle strain is rated in degrees. First-degree strains have the least amount of muscle fiber tearing and pain. Second-degree and third-degree strains have increasingly more tearing and pain.  Usually, recovery from muscle strain takes 1-2 weeks.  Complete healing takes 5-6 weeks.  CAUSES  Muscle strain happens when a sudden, violent force placed on a muscle stretches it too far. This may occur with lifting, sports, or a fall.  RISK FACTORS Muscle strain is especially common in athletes.  SIGNS AND SYMPTOMS At the site of the muscle strain, there may be:  Pain.  Bruising.  Swelling.  Difficulty using the muscle due to pain or lack of normal function. DIAGNOSIS  Your health  care provider will perform a physical exam and ask about your medical history. TREATMENT  Often, the best treatment for a muscle strain is resting, icing, and applying cold compresses to the injured area.  HOME CARE INSTRUCTIONS   Use the PRICE method of treatment to promote muscle healing during the first 2-3 days after your injury. The PRICE method involves:  Protecting the muscle from being injured again.  Restricting your activity and resting the injured body part.  Icing your injury. To do this, put ice in a plastic bag. Place a towel between your skin and the bag. Then, apply the ice and leave it on from 15-20 minutes each hour. After the third day, switch to moist heat packs.  Apply compression to the injured area with a splint or elastic bandage. Be careful not to wrap it too tightly. This may interfere with blood circulation or increase swelling.  Elevate the injured body part above the level of your heart as often as you can.  Only take over-the-counter or prescription medicines for pain, discomfort, or fever as directed by your health care provider.  Warming up prior to exercise helps to prevent future muscle strains. SEEK MEDICAL CARE IF:   You have increasing pain or swelling in the injured area.  You have numbness, tingling, or a significant loss of strength in the injured area. MAKE SURE YOU:   Understand these instructions.  Will watch your condition.  Will get help right away if you are not doing well or get worse. Document Released: 01/26/2005 Document Revised: 11/16/2012 Document Reviewed: 08/25/2012 [facility] CenterExitCare Patient Information 2015 [place], [place]. This information is not intended to replace advice given to you by your health care provider. Make sure you discuss any questions you have with your health care provider.

## 2013-12-04 NOTE — ED Notes (Signed)
Having tingling and numbness to left shoulder for the pass year but did not pay much attention to pain because pain would come and go.  Noted yesterday when waking up, pain to left shoulder and radiating to neck and arm.  Have not taken any medication.

## 2013-12-04 NOTE — ED Notes (Signed)
Patient c/o chronic intermittent left shoulder pain x 1 month. No injury. States worse with neck movement.
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
## 2013-12-04 NOTE — ED Notes (Signed)
MD at bedside. 

## 2013-12-05 ENCOUNTER — Emergency Department (HOSPITAL_COMMUNITY)
Admission: EM | Admit: 2013-12-05 | Discharge: 2013-12-05 | Disposition: A | Discharge status: Home / Self Care | Payer: Self-pay | Attending: Emergency Medicine | Admitting: Emergency Medicine

## 2013-12-05 ENCOUNTER — Encounter (HOSPITAL_COMMUNITY): Payer: Self-pay | Admitting: Emergency Medicine

## 2013-12-05 DIAGNOSIS — Z720 Tobacco use: Secondary | ICD-10-CM | POA: Insufficient documentation

## 2013-12-05 DIAGNOSIS — R109 Unspecified abdominal pain: Secondary | ICD-10-CM | POA: Insufficient documentation

## 2013-12-05 DIAGNOSIS — Z87442 Personal history of urinary calculi: Secondary | ICD-10-CM | POA: Insufficient documentation

## 2013-12-05 DIAGNOSIS — M25512 Pain in left shoulder: Secondary | ICD-10-CM | POA: Insufficient documentation

## 2013-12-05 DIAGNOSIS — G8929 Other chronic pain: Secondary | ICD-10-CM | POA: Insufficient documentation

## 2013-12-05 MED ORDER — DICLOFENAC SODIUM 75 MG PO TBEC: 75.0000 mg | DELAYED_RELEASE_TABLET | Freq: Two times a day (BID) | ORAL | Status: DC

## 2013-12-05 MED ORDER — DEXAMETHASONE 4 MG PO TABS: ORAL_TABLET | ORAL | Status: DC
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
## 2013-12-05 NOTE — ED Notes (Signed)
PA at bedside.

## 2013-12-05 NOTE — Discharge Instructions (Signed)
Please see the orthopedic MD listed above, or the orthopedic MD of your choice concerning your shoulder.Please use decadron and diclofenac with food until all taken. Shoulder Pain The shoulder is the joint that connects your arm to your body. Muscles and band-like tissues that connect bones to muscles (tendons) hold the joint together. Shoulder pain is felt if an injury or medical problem affects one or more parts of the shoulder. HOME CARE   Put ice on the sore area.  Put ice in a plastic bag.  Place a towel between your skin and the bag.  Leave the ice on for 15-20 minutes, 03-04 times a day for the first 2 days.  Stop using cold packs if they do not help with the pain.  If you were given something to keep your shoulder from moving (sling; shoulder immobilizer), wear it as told. Only take it off to shower or bathe.  Move your arm as little as possible, but keep your hand moving to prevent puffiness (swelling).  Squeeze a soft ball or foam pad as much as possible to help prevent swelling.  Take medicine as told by your doctor. GET HELP IF:  You have progressing new pain in your arm, hand, or fingers.  Your hand or fingers get cold.  Your medicine does not help lessen your pain. GET HELP RIGHT AWAY IF:   Your arm, hand, or fingers are numb or tingling.  Your arm, hand, or fingers are puffy (swollen), painful, or turn white or blue. MAKE SURE YOU:   Understand these instructions.  Will watch your condition.  Will get help right away if you are not doing well or get worse. Document Released: 07/15/2007 Document Revised: 06/12/2013 Document Reviewed: 08/10/2011 [facility] Patient Information 2015 [place], [place]. This information is not intended to replace advice given to you by your health care provider. Make sure you discuss any questions you have with your health care provider.
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
## 2013-12-05 NOTE — ED Notes (Signed)
Chronic pain, wants work note.

## 2013-12-07 NOTE — ED Provider Notes (Signed)
Medical screening examination/treatment/procedure(s) were performed by non-physician practitioner and as supervising physician I was immediately available for consultation/collaboration.   EKG Interpretation None        Trice Aspinall L Everlena Mackley, MD 12/07/13 1540 

## 2013-12-08 ENCOUNTER — Emergency Department (HOSPITAL_COMMUNITY)
Admission: EM | Admit: 2013-12-08 | Discharge: 2013-12-08 | Disposition: A | Discharge status: Home / Self Care | Payer: Self-pay | Attending: Emergency Medicine | Admitting: Emergency Medicine

## 2013-12-08 ENCOUNTER — Emergency Department (HOSPITAL_COMMUNITY): Payer: Self-pay

## 2013-12-08 ENCOUNTER — Encounter (HOSPITAL_COMMUNITY): Payer: Self-pay | Admitting: Emergency Medicine

## 2013-12-08 DIAGNOSIS — Z7952 Long term (current) use of systemic steroids: Secondary | ICD-10-CM | POA: Insufficient documentation

## 2013-12-08 DIAGNOSIS — M549 Dorsalgia, unspecified: Secondary | ICD-10-CM | POA: Insufficient documentation

## 2013-12-08 DIAGNOSIS — Z8709 Personal history of other diseases of the respiratory system: Secondary | ICD-10-CM | POA: Insufficient documentation

## 2013-12-08 DIAGNOSIS — Z791 Long term (current) use of non-steroidal anti-inflammatories (NSAID): Secondary | ICD-10-CM | POA: Insufficient documentation

## 2013-12-08 DIAGNOSIS — Z720 Tobacco use: Secondary | ICD-10-CM | POA: Insufficient documentation

## 2013-12-08 DIAGNOSIS — Z79899 Other long term (current) drug therapy: Secondary | ICD-10-CM | POA: Insufficient documentation

## 2013-12-08 DIAGNOSIS — Z87442 Personal history of urinary calculi: Secondary | ICD-10-CM | POA: Insufficient documentation

## 2013-12-08 DIAGNOSIS — R109 Unspecified abdominal pain: Secondary | ICD-10-CM | POA: Insufficient documentation

## 2013-12-08 LAB — URINALYSIS, ROUTINE W REFLEX MICROSCOPIC
BILIRUBIN URINE: NEGATIVE
GLUCOSE, UA: NEGATIVE mg/dL
Ketones, ur: NEGATIVE mg/dL
Leukocytes, UA: NEGATIVE
Nitrite: NEGATIVE
PH: 6 (ref 5.0–8.0)
Protein, ur: NEGATIVE mg/dL
SPECIFIC GRAVITY, URINE: 1.02 (ref 1.005–1.030)
Urobilinogen, UA: 0.2 mg/dL (ref 0.0–1.0)

## 2013-12-08 LAB — URINE MICROSCOPIC-ADD ON

## 2013-12-08 MED ORDER — KETOROLAC TROMETHAMINE 60 MG/2ML IM SOLN
60.0000 mg | Freq: Once | INTRAMUSCULAR | Status: AC
  Administered 2013-12-08: 60 mg via INTRAMUSCULAR
  Filled 2013-12-08: qty 2

## 2013-12-08 MED ORDER — METHOCARBAMOL 500 MG PO TABS: 1000.0000 mg | ORAL_TABLET | Freq: Three times a day (TID) | ORAL | Status: DC | PRN

## 2013-12-08 MED ORDER — METHOCARBAMOL 500 MG PO TABS
1000.0000 mg | ORAL_TABLET | Freq: Once | ORAL | Status: AC
  Administered 2013-12-08: 1000 mg via ORAL
  Filled 2013-12-08: qty 2

## 2013-12-08 MED ORDER — IBUPROFEN 600 MG PO TABS: 600.0000 mg | ORAL_TABLET | Freq: Four times a day (QID) | ORAL | Status: DC | PRN

## 2013-12-08 NOTE — ED Notes (Signed)
Patient verbalizes understanding of discharge instructions, prescription medications, home care and follow up care. Patient ambulatory out of department at this time. 

## 2013-12-08 NOTE — ED Provider Notes (Signed)
CSN: [id number]     Arrival date & time 12/08/13  0220 History   First MD Initiated Contact with Patient 12/08/13 0245     Chief Complaint  Patient presents with  . Flank Pain     (Consider location/radiation/quality/duration/timing/severity/associated sxs/prior Treatment) HPI Patient presents with left flank pain that radiates to his left abdomen. This started around midnight. It is not associated with nausea or vomiting. He has had some urinary hesitancy. No hematuria. No fever or chills. States has had kidney stones in the past. Past Medical History  Diagnosis Date  . Strep throat   . Kidney stone    History reviewed. No pertinent past surgical history. Family History  Problem Relation Age of Onset  . Diabetes Father    History  Substance Use Topics  . Smoking status: Current Every Day Smoker -- 1.00 packs/day    Types: Cigarettes  . Smokeless tobacco: Not on file  . Alcohol Use: No    Review of Systems  Constitutional: Negative for fever and chills.  Respiratory: Negative for shortness of breath.   Cardiovascular: Negative for chest pain.  Gastrointestinal: Positive for abdominal pain. Negative for nausea, vomiting and diarrhea.  Genitourinary: Positive for frequency, flank pain and difficulty urinating. Negative for hematuria.  Musculoskeletal: Positive for back pain and myalgias. Negative for neck pain and neck stiffness.  Skin: Negative for rash and wound.  Neurological: Negative for dizziness, weakness, light-headedness, numbness and headaches.  All other systems reviewed and are negative.     Allergies  Review of patient's allergies indicates no known allergies.  Home Medications   Prior to Admission medications   Medication Sig Start Date End Date Taking? Authorizing Provider  cyclobenzaprine (FLEXERIL) 5 MG tablet Take 1 tablet (5 mg total) by mouth 3 (three) times daily as needed for muscle spasms. 12/04/13  Yes Kristen N Ward, DO  dexamethasone  (DECADRON) 4 MG tablet 1 po bid with food 12/05/13  Yes Kathie DikeHobson M Bryant, PA-C  diclofenac (VOLTAREN) 75 MG EC tablet Take 1 tablet (75 mg total) by mouth 2 (two) times daily. 12/05/13  Yes Kathie DikeHobson M Bryant, PA-C  promethazine (PHENERGAN) 25 MG tablet Take 1 tablet (25 mg total) by mouth every 6 (six) hours as needed. 11/14/13  Yes Donnetta HutchingBrian Cook, MD  aspirin 325 MG tablet Take 325-650 mg by mouth daily as needed for moderate pain.    Historical Provider, MD  Aspirin-Salicylamide-Caffeine (BC HEADACHE POWDER PO) Take 2 packets by mouth daily as needed (pain).    Historical Provider, MD  ibuprofen (ADVIL,MOTRIN) 600 MG tablet Take 1 tablet (600 mg total) by mouth every 6 (six) hours as needed. 12/08/13   Loren Raceravid Abdelrahman Nair, MD  methocarbamol (ROBAXIN) 500 MG tablet Take 2 tablets (1,000 mg total) by mouth every 8 (eight) hours as needed for muscle spasms. 12/08/13   Loren Raceravid Rocklin Soderquist, MD   BP 141/85  Pulse 79  Temp(Src) 97.6 F (36.4 C) (Oral)  Resp 18  Ht 5\' 11"  (1.803 m)  Wt 205 lb (92.987 kg)  BMI 28.60 kg/m2  SpO2 99% Physical Exam  Nursing note and vitals reviewed. Constitutional: He is oriented to person, place, and time. He appears well-developed and well-nourished. No distress.  Patient is in absolutely no distress  HENT:  Head: Normocephalic and atraumatic.  Mouth/Throat: Oropharynx is clear and moist.  Eyes: EOM are normal. Pupils are equal, round, and reactive to light.  Neck: Normal range of motion. Neck supple.  Cardiovascular: Normal rate and regular rhythm.  Pulmonary/Chest: Effort normal and breath sounds normal. No respiratory distress. He has no wheezes. He has no rales.  Abdominal: Soft. Bowel sounds are normal. He exhibits no distension and no mass. There is no tenderness. There is no rebound and no guarding.  Musculoskeletal: Normal range of motion. He exhibits tenderness (patient has mild paraspinal lumbar tenderness. He has no flank tenderness.). He exhibits no edema.   Neurological: He is alert and oriented to person, place, and time.  Patient is alert and oriented x3 with clear, goal oriented speech. Patient has 5/5 motor in all extremities. Sensation is intact to light touch. Patient has a normal gait and walks without assistance.  Skin: Skin is warm and dry. No rash noted. No erythema.  Psychiatric: He has a normal mood and affect. His behavior is normal.    ED Course  Procedures (including critical care time) Labs Review Labs Reviewed  URINALYSIS, ROUTINE W REFLEX MICROSCOPIC - Abnormal; Notable for the following:    Hgb urine dipstick LARGE (*)    All other components within normal limits  URINE MICROSCOPIC-ADD ON - Abnormal; Notable for the following:    Bacteria, UA FEW (*)    All other components within normal limits    Imaging Review Dg Abd 1 View  12/08/2013   CLINICAL DATA:  LEFT flank pain for 5 hr, diagnosed with kidney stones September 2015.  EXAM: ABDOMEN - 1 VIEW  COMPARISON:  Abdominal radiograph November 14, 2013 g CT of the abdomen and pelvis October 19, 2013  FINDINGS: The bowel gas pattern is normal. No radio-opaque calculi or other significant radiographic abnormality are seen. Stable punctate calcification in LEFT pelvis.  IMPRESSION: No definite urolithiasis though CT would be more sensitive.   Electronically Signed   By: Awilda Metroourtnay  Bloomer   On: 12/08/2013 05:17     EKG Interpretation None      MDM   Final diagnoses:  Left flank pain    Patient is not presenting typically for renal colic. He is very well-appearing.   No evidence of stone on KUB. Advised to follow-up for fever, persistent vomiting, worsening pain or any concerns.  Loren Raceravid Kaitlynd Phillips, MD 12/08/13 [id number]

## 2013-12-08 NOTE — Discharge Instructions (Signed)
Flank Pain °Flank pain refers to pain that is located on the side of the body between the upper abdomen and the back. The pain may occur over a short period of time (acute) or may be long-term or reoccurring (chronic). It may be mild or severe. Flank pain can be caused by many things. °CAUSES  °Some of the more common causes of flank pain include: °· Muscle strains.   °· Muscle spasms.   °· A disease of your spine (vertebral disk disease).   °· A lung infection (pneumonia).   °· Fluid around your lungs (pulmonary edema).   °· A kidney infection.   °· Kidney stones.   °· A very painful skin rash caused by the chickenpox virus (shingles).   °· Gallbladder disease.   °HOME CARE INSTRUCTIONS  °Home care will depend on the cause of your pain. In general, °· Rest as directed by your caregiver. °· Drink enough fluids to keep your urine clear or pale yellow. °· Only take over-the-counter or prescription medicines as directed by your caregiver. Some medicines may help relieve the pain. °· Tell your caregiver about any changes in your pain. °· Follow up with your caregiver as directed. °SEEK IMMEDIATE MEDICAL CARE IF:  °· Your pain is not controlled with medicine.   °· You have new or worsening symptoms. °· Your pain increases.   °· You have abdominal pain.   °· You have shortness of breath.   °· You have persistent nausea or vomiting.   °· You have swelling in your abdomen.   °· You feel faint or pass out.   °· You have blood in your urine. °· You have a fever or persistent symptoms for more than 2-3 days. °· You have a fever and your symptoms suddenly get worse. °MAKE SURE YOU:  °· Understand these instructions. °· Will watch your condition. °· Will get help right away if you are not doing well or get worse. °Document Released: 03/19/2005 Document Revised: 10/21/2011 Document Reviewed: 09/10/2011 °ExitCare® Patient Information ©2015 [employer]. This information is not intended to replace advice given to you by your  health care provider. Make sure you discuss any questions you have with your health care provider. ° °

## 2013-12-08 NOTE — ED Notes (Signed)
Patient states left flank pain that started at midnight. Patient has a history of kidney stones, and feels like this is what it is. Patient states he has urinary urgency and pain/burning with urination. A&OX4

## 2013-12-25 ENCOUNTER — Emergency Department (HOSPITAL_COMMUNITY)
Admission: EM | Admit: 2013-12-25 | Discharge: 2013-12-25 | Disposition: A | Discharge status: Home / Self Care | Payer: Self-pay | Attending: Emergency Medicine | Admitting: Emergency Medicine

## 2013-12-25 ENCOUNTER — Encounter (HOSPITAL_COMMUNITY): Payer: Self-pay | Admitting: *Deleted

## 2013-12-25 DIAGNOSIS — R112 Nausea with vomiting, unspecified: Secondary | ICD-10-CM | POA: Insufficient documentation

## 2013-12-25 DIAGNOSIS — Z720 Tobacco use: Secondary | ICD-10-CM | POA: Insufficient documentation

## 2013-12-25 DIAGNOSIS — Z79899 Other long term (current) drug therapy: Secondary | ICD-10-CM | POA: Insufficient documentation

## 2013-12-25 DIAGNOSIS — R109 Unspecified abdominal pain: Secondary | ICD-10-CM

## 2013-12-25 DIAGNOSIS — Z8709 Personal history of other diseases of the respiratory system: Secondary | ICD-10-CM | POA: Insufficient documentation

## 2013-12-25 DIAGNOSIS — R1032 Left lower quadrant pain: Secondary | ICD-10-CM | POA: Insufficient documentation

## 2013-12-25 DIAGNOSIS — Z87442 Personal history of urinary calculi: Secondary | ICD-10-CM | POA: Insufficient documentation

## 2013-12-25 DIAGNOSIS — Z7952 Long term (current) use of systemic steroids: Secondary | ICD-10-CM | POA: Insufficient documentation

## 2013-12-25 DIAGNOSIS — Z791 Long term (current) use of non-steroidal anti-inflammatories (NSAID): Secondary | ICD-10-CM | POA: Insufficient documentation

## 2013-12-25 LAB — URINALYSIS, ROUTINE W REFLEX MICROSCOPIC
Bilirubin Urine: NEGATIVE
Glucose, UA: NEGATIVE mg/dL
Hgb urine dipstick: NEGATIVE
Ketones, ur: NEGATIVE mg/dL
LEUKOCYTES UA: NEGATIVE
NITRITE: NEGATIVE
PH: 6.5 (ref 5.0–8.0)
Protein, ur: NEGATIVE mg/dL
Specific Gravity, Urine: 1.015 (ref 1.005–1.030)
Urobilinogen, UA: 0.2 mg/dL (ref 0.0–1.0)

## 2013-12-25 MED ORDER — ONDANSETRON HCL 4 MG/2ML IJ SOLN
4.0000 mg | Freq: Once | INTRAMUSCULAR | Status: AC
  Administered 2013-12-25: 4 mg via INTRAVENOUS
  Filled 2013-12-25: qty 2

## 2013-12-25 MED ORDER — KETOROLAC TROMETHAMINE 30 MG/ML IJ SOLN
30.0000 mg | Freq: Once | INTRAMUSCULAR | Status: AC
  Administered 2013-12-25: 30 mg via INTRAVENOUS
  Filled 2013-12-25: qty 1

## 2013-12-25 MED ORDER — OXYCODONE-ACETAMINOPHEN 5-325 MG PO TABS: 1.0000 | ORAL_TABLET | Freq: Four times a day (QID) | ORAL | Status: DC | PRN

## 2013-12-25 MED ORDER — MORPHINE SULFATE 4 MG/ML IJ SOLN
4.0000 mg | Freq: Once | INTRAMUSCULAR | Status: AC
Start: 2013-12-25 — End: 2013-12-25
  Administered 2013-12-25: 4 mg via INTRAVENOUS
  Filled 2013-12-25: qty 1

## 2013-12-25 NOTE — Discharge Instructions (Signed)
Percocet as prescribed as needed for pain.  Follow-up with urology if not improving in the next 2-3 days. The contact information has been provided on this discharge summary.   Flank Pain Flank pain refers to pain that is located on the side of the body between the upper abdomen and the back. The pain may occur over a short period of time (acute) or may be long-term or reoccurring (chronic). It may be mild or severe. Flank pain can be caused by many things. CAUSES  Some of the more common causes of flank pain include:  Muscle strains.   Muscle spasms.   A disease of your spine (vertebral disk disease).   A lung infection (pneumonia).   Fluid around your lungs (pulmonary edema).   A kidney infection.   Kidney stones.   A very painful skin rash caused by the chickenpox virus (shingles).   Gallbladder disease.  HOME CARE INSTRUCTIONS  Home care will depend on the cause of your pain. In general,  Rest as directed by your caregiver.  Drink enough fluids to keep your urine clear or pale yellow.  Only take over-the-counter or prescription medicines as directed by your caregiver. Some medicines may help relieve the pain.  Tell your caregiver about any changes in your pain.  Follow up with your caregiver as directed. SEEK IMMEDIATE MEDICAL CARE IF:   Your pain is not controlled with medicine.   You have new or worsening symptoms.  Your pain increases.   You have abdominal pain.   You have shortness of breath.   You have persistent nausea or vomiting.   You have swelling in your abdomen.   You feel faint or pass out.   You have blood in your urine.  You have a fever or persistent symptoms for more than 2-3 days.  You have a fever and your symptoms suddenly get worse. MAKE SURE YOU:   Understand these instructions.  Will watch your condition.  Will get help right away if you are not doing well or get worse. Document Released: 03/19/2005  Document Revised: 10/21/2011 Document Reviewed: 09/10/2011 [facility]Exit[facility] Patient Information 2015 [place]ExitCare, [place]LLC. This information is not intended to replace advice given to you by your health care provider. Make sure you discuss any questions you have with your health care provider.

## 2013-12-25 NOTE — ED Provider Notes (Signed)
CSN: [id number]     Arrival date & time 12/25/13  0706 History  This chart was scribed for Geoffery Lyonsouglas Khamia Stambaugh, MD by Abel PrestoKara Demonbreun, ED Scribe. This patient was seen in room APA06/APA06 and the patient's care was started at 7:20 AM.    Chief Complaint  Patient presents with  . Flank Pain     HPI  HPI Comments: Dale Martinez is a 28 y.o. male with a history of kidney stones who presents to the Emergency Department complaining of left flank pain with onset yesterday. Pt notes the pain radiates to his groin. He notes associated nausea and vomiting this morning with mild dysuria. He states he passed a recent kidney stone last month. His last CT scan was in September. Pt denies other medical problems. Pt denies fevers, diarrhea, constipation, chills, and hematuria.    Past Medical History  Diagnosis Date  . Strep throat   . Kidney stone    History reviewed. No pertinent past surgical history. Family History  Problem Relation Age of Onset  . Diabetes Father    History  Substance Use Topics  . Smoking status: Current Every Day Smoker -- 1.00 packs/day    Types: Cigarettes  . Smokeless tobacco: Not on file  . Alcohol Use: No    Review of Systems A complete 10 system review of systems was obtained and all systems are negative except as noted in the HPI and PMH.     Allergies  Review of patient's allergies indicates no known allergies.  Home Medications   Prior to Admission medications   Medication Sig Start Date End Date Taking? Authorizing Provider  aspirin 325 MG tablet Take 325-650 mg by mouth daily as needed for moderate pain.    Historical Provider, MD  Aspirin-Salicylamide-Caffeine (BC HEADACHE POWDER PO) Take 2 packets by mouth daily as needed (pain).    Historical Provider, MD  cyclobenzaprine (FLEXERIL) 5 MG tablet Take 1 tablet (5 mg total) by mouth 3 (three) times daily as needed for muscle spasms. 12/04/13   Kristen N Ward, DO  dexamethasone (DECADRON) 4 MG tablet 1 po  bid with food 12/05/13   Kathie DikeHobson M Bryant, PA-C  diclofenac (VOLTAREN) 75 MG EC tablet Take 1 tablet (75 mg total) by mouth 2 (two) times daily. 12/05/13   Kathie DikeHobson M Bryant, PA-C  ibuprofen (ADVIL,MOTRIN) 600 MG tablet Take 1 tablet (600 mg total) by mouth every 6 (six) hours as needed. 12/08/13   Loren Raceravid Yelverton, MD  methocarbamol (ROBAXIN) 500 MG tablet Take 2 tablets (1,000 mg total) by mouth every 8 (eight) hours as needed for muscle spasms. 12/08/13   Loren Raceravid Yelverton, MD  promethazine (PHENERGAN) 25 MG tablet Take 1 tablet (25 mg total) by mouth every 6 (six) hours as needed. 11/14/13   Donnetta HutchingBrian Cook, MD   BP 149/96 mmHg  Pulse 90  Temp(Src) 98 F (36.7 C) (Oral)  Resp 16  Ht 5\' 11"  (1.803 m)  Wt 205 lb (92.987 kg)  BMI 28.60 kg/m2  SpO2 100% Physical Exam  Constitutional: He is oriented to person, place, and time. He appears well-developed and well-nourished.  HENT:  Head: Normocephalic.  Eyes: Conjunctivae are normal.  Neck: Normal range of motion. Neck supple.  Cardiovascular: Normal rate and regular rhythm.  Exam reveals no gallop and no friction rub.   No murmur heard. Pulmonary/Chest: Effort normal and breath sounds normal. No respiratory distress. He has no wheezes. He has no rales.  Abdominal: Soft. Bowel sounds are normal. He exhibits no  distension. There is tenderness.  TTP in the left flank and left lower quadrant with no rebound and no guarding   Musculoskeletal: Normal range of motion.  Neurological: He is alert and oriented to person, place, and time.  Skin: Skin is warm and dry.  Psychiatric: He has a normal mood and affect. His behavior is normal.  Nursing note and vitals reviewed.   ED Course  Procedures (including critical care time) DIAGNOSTIC STUDIES: Oxygen Saturation is 100% on room air, normal by my interpretation.    COORDINATION OF CARE: 7:24 AM Discussed treatment plan with patient at beside, the patient agrees with the plan and has no further questions  at this time.   Labs Review Labs Reviewed - No data to display  Imaging Review No results found.   EKG Interpretation None      MDM   Final diagnoses:  None    Patient is a 28 year old male who presents with left flank pain. He has a history of renal calculi and this feels the same. He underwent a CT scan approximately 6 weeks ago which revealed small stones within the left kidney. I suspect one of these has migrated and is now causing obstruction although there is no hematuria. He is feeling better with medications given in the ER. To spare him further radiation, we have decided to treat his pain, give this time, and return as needed if he worsens or if he does not improve.   I personally performed the services described in this documentation, which was scribed in my presence. The recorded information has been reviewed and is accurate.      Geoffery Lyonsouglas Jahrell Hamor, MD 12/25/13 [phone]

## 2013-12-25 NOTE — ED Notes (Signed)
Flank pain started yesterday. Pt is nauseous and has vomited once. Pt states it hurts when he urinates. Denies blood in urine. Pt has hx of kidney stones

## 2013-12-28 ENCOUNTER — Emergency Department (HOSPITAL_COMMUNITY): Payer: Self-pay

## 2013-12-28 ENCOUNTER — Encounter (HOSPITAL_COMMUNITY): Payer: Self-pay

## 2013-12-28 ENCOUNTER — Emergency Department (HOSPITAL_COMMUNITY)
Admission: EM | Admit: 2013-12-28 | Discharge: 2013-12-28 | Disposition: A | Discharge status: Home / Self Care | Payer: Self-pay | Attending: Emergency Medicine | Admitting: Emergency Medicine

## 2013-12-28 DIAGNOSIS — Z7982 Long term (current) use of aspirin: Secondary | ICD-10-CM | POA: Insufficient documentation

## 2013-12-28 DIAGNOSIS — Z8709 Personal history of other diseases of the respiratory system: Secondary | ICD-10-CM | POA: Insufficient documentation

## 2013-12-28 DIAGNOSIS — Z7952 Long term (current) use of systemic steroids: Secondary | ICD-10-CM | POA: Insufficient documentation

## 2013-12-28 DIAGNOSIS — R109 Unspecified abdominal pain: Secondary | ICD-10-CM | POA: Insufficient documentation

## 2013-12-28 DIAGNOSIS — Z720 Tobacco use: Secondary | ICD-10-CM | POA: Insufficient documentation

## 2013-12-28 DIAGNOSIS — Z79899 Other long term (current) drug therapy: Secondary | ICD-10-CM | POA: Insufficient documentation

## 2013-12-28 DIAGNOSIS — Z87442 Personal history of urinary calculi: Secondary | ICD-10-CM | POA: Insufficient documentation

## 2013-12-28 HISTORY — DX: Unspecified abdominal pain: R10.9

## 2013-12-28 HISTORY — DX: Other chronic pain: G89.29

## 2013-12-28 LAB — URINALYSIS, ROUTINE W REFLEX MICROSCOPIC
Bilirubin Urine: NEGATIVE
GLUCOSE, UA: NEGATIVE mg/dL
HGB URINE DIPSTICK: NEGATIVE
Ketones, ur: NEGATIVE mg/dL
LEUKOCYTES UA: NEGATIVE
Nitrite: NEGATIVE
PH: 5.5 (ref 5.0–8.0)
PROTEIN: NEGATIVE mg/dL
Specific Gravity, Urine: >1.03 — ABNORMAL HIGH (ref 1.005–1.030)
Urobilinogen, UA: 0.2 mg/dL (ref 0.0–1.0)

## 2013-12-28 MED ORDER — ONDANSETRON HCL 4 MG PO TABS: 4.0000 mg | ORAL_TABLET | Freq: Three times a day (TID) | ORAL | Status: DC | PRN

## 2013-12-28 MED ORDER — KETOROLAC TROMETHAMINE 60 MG/2ML IM SOLN
60.0000 mg | Freq: Once | INTRAMUSCULAR | Status: AC
  Administered 2013-12-28: 60 mg via INTRAMUSCULAR
  Filled 2013-12-28: qty 2

## 2013-12-28 MED ORDER — ONDANSETRON 8 MG PO TBDP
8.0000 mg | ORAL_TABLET | Freq: Once | ORAL | Status: AC
  Administered 2013-12-28: 8 mg via ORAL
  Filled 2013-12-28: qty 1

## 2013-12-28 MED ORDER — NAPROXEN 250 MG PO TABS: 250.0000 mg | ORAL_TABLET | Freq: Two times a day (BID) | ORAL | Status: DC | PRN

## 2013-12-28 MED ORDER — OXYCODONE-ACETAMINOPHEN 5-325 MG PO TABS
2.0000 | ORAL_TABLET | Freq: Once | ORAL | Status: AC
  Administered 2013-12-28: 2 via ORAL
  Filled 2013-12-28 (×2): qty 2

## 2013-12-28 NOTE — ED Provider Notes (Signed)
CSN: [id number]     Arrival date & time 12/28/13  0702 History   First MD Initiated Contact with Patient 12/28/13 [phone]     Chief Complaint  Patient presents with  . Flank Pain    HPI Pt was seen at 0945.  Per pt, c/o sudden onset and persistence of waxing and waning left sided flank "pain" that began 4 days ago.  Pt describes the pain as "like my last kidney stone," and radiating into the left side of his abd.  Has been associated with multiple intermittent episodes of N/V.  Denies testicular pain/swelling, no dysuria/hematuria, no abd pain, no diarrhea, no black or blood in emesis, no CP/SOB. The symptoms have been associated with no other complaints. The patient has a significant history of similar symptoms previously, recently being evaluated for this complaint and multiple prior evals for same.  Pt was evaluated in the ED 3 days ago for same, dx with kidney stone. Pt states he "didn't have any meds for nausea" so he came to the ED for re-eval. Pt has not f/u with Uro MD as previously instructed because he "didn't know who to go to."      Past Medical History  Diagnosis Date  . Strep throat   . Kidney stone    History reviewed. No pertinent past surgical history.   Family History  Problem Relation Age of Onset  . Diabetes Father    History  Substance Use Topics  . Smoking status: Current Every Day Smoker -- 1.00 packs/day    Types: Cigarettes  . Smokeless tobacco: Not on file  . Alcohol Use: No    Review of Systems ROS: Statement: All systems negative except as marked or noted in the HPI; Constitutional: Negative for fever and chills. ; ; Eyes: Negative for eye pain, redness and discharge. ; ; ENMT: Negative for ear pain, hoarseness, nasal congestion, sinus pressure and sore throat. ; ; Cardiovascular: Negative for chest pain, palpitations, diaphoresis, dyspnea and peripheral edema. ; ; Respiratory: Negative for cough, wheezing and stridor. ; ; Gastrointestinal: +N/V. Negative  for diarrhea, abdominal pain, blood in stool, hematemesis, jaundice and rectal bleeding. . ; ; Genitourinary: +flank pain. Negative for dysuria and hematuria. ; Genital:  No penile drainage or rash, no testicular pain or swelling, no scrotal rash or swelling. ;; ; Musculoskeletal: Negative for back pain and neck pain. Negative for swelling and trauma.; ; Skin: Negative for pruritus, rash, abrasions, blisters, bruising and skin lesion.; ; Neuro: Negative for headache, lightheadedness and neck stiffness. Negative for weakness, altered level of consciousness , altered mental status, extremity weakness, paresthesias, involuntary movement, seizure and syncope.        Allergies  Review of patient's allergies indicates no known allergies.  Home Medications   Prior to Admission medications   Medication Sig Start Date End Date Taking? Authorizing Provider  aspirin 325 MG tablet Take 325-650 mg by mouth daily as needed for moderate pain.    Historical Provider, MD  Aspirin-Salicylamide-Caffeine (BC HEADACHE POWDER PO) Take 2 packets by mouth daily as needed (pain).    Historical Provider, MD  cyclobenzaprine (FLEXERIL) 5 MG tablet Take 1 tablet (5 mg total) by mouth 3 (three) times daily as needed for muscle spasms. 12/04/13   Kristen N Ward, DO  dexamethasone (DECADRON) 4 MG tablet 1 po bid with food 12/05/13   Kathie Dike, PA-C  diclofenac (VOLTAREN) 75 MG EC tablet Take 1 tablet (75 mg total) by mouth 2 (two) times daily.  12/05/13   Kathie DikeHobson M Bryant, PA-C  ibuprofen (ADVIL,MOTRIN) 600 MG tablet Take 1 tablet (600 mg total) by mouth every 6 (six) hours as needed. 12/08/13   Loren Raceravid Yelverton, MD  methocarbamol (ROBAXIN) 500 MG tablet Take 2 tablets (1,000 mg total) by mouth every 8 (eight) hours as needed for muscle spasms. 12/08/13   Loren Raceravid Yelverton, MD  oxyCODONE-acetaminophen (PERCOCET/ROXICET) 5-325 MG per tablet Take 1-2 tablets by mouth every 6 (six) hours as needed for moderate pain or severe  pain. 12/25/13   Geoffery Lyonsouglas Delo, MD  promethazine (PHENERGAN) 25 MG tablet Take 1 tablet (25 mg total) by mouth every 6 (six) hours as needed. 11/14/13   Donnetta HutchingBrian Cook, MD   BP 138/86 mmHg  Pulse 83  Temp(Src) 97.9 F (36.6 C) (Oral)  Resp 16  SpO2 100% Physical Exam 0950: Physical examination:  Nursing notes reviewed; Vital signs and O2 SAT reviewed;  Constitutional: Well developed, Well nourished, Well hydrated, In no acute distress; Head:  Normocephalic, atraumatic; Eyes: EOMI, PERRL, No scleral icterus; ENMT: Mouth and pharynx normal, Mucous membranes moist; Neck: Supple, Full range of motion, No lymphadenopathy; Cardiovascular: Regular rate and rhythm, No murmur, rub, or gallop; Respiratory: Breath sounds clear & equal bilaterally, No rales, rhonchi, wheezes.  Speaking full sentences with ease, Normal respiratory effort/excursion; Chest: Nontender, Movement normal; Abdomen: Soft, Nontender, Nondistended, Normal bowel sounds; Genitourinary: No CVA tenderness; Spine:  No midline CS, TS, LS tenderness.;; Extremities: Pulses normal, No tenderness, No edema, No calf edema or asymmetry.; Neuro: AA&Ox3, Major CN grossly intact.  Speech clear. No gross focal motor or sensory deficits in extremities. Climbs on and off stretcher easily by himself. Gait steady.; Skin: Color normal, Warm, Dry.   ED Course  Procedures     MDM  MDM Reviewed: previous chart, nursing note and vitals Reviewed previous: labs and x-ray Interpretation: labs and CT scan      Results for orders placed or performed during the hospital encounter of 12/28/13  Urinalysis, Routine w reflex microscopic  Result Value Ref Range   Color, Urine YELLOW YELLOW   APPearance CLEAR CLEAR   Specific Gravity, Urine >1.030 (H) 1.005 - 1.030   pH 5.5 5.0 - 8.0   Glucose, UA NEGATIVE NEGATIVE mg/dL   Hgb urine dipstick NEGATIVE NEGATIVE   Bilirubin Urine NEGATIVE NEGATIVE   Ketones, ur NEGATIVE NEGATIVE mg/dL   Protein, ur NEGATIVE  NEGATIVE mg/dL   Urobilinogen, UA 0.2 0.0 - 1.0 mg/dL   Nitrite NEGATIVE NEGATIVE   Leukocytes, UA NEGATIVE NEGATIVE   Ct Renal Stone Study 12/28/2013   CLINICAL DATA:  Acute left flank pain since yesterday with nausea and vomiting, dysuria  EXAM: CT ABDOMEN AND PELVIS WITHOUT CONTRAST  TECHNIQUE: Multidetector CT imaging of the abdomen and pelvis was performed following the standard protocol without IV contrast.  COMPARISON:  10/19/2013  FINDINGS: Clear lung bases. Normal heart size. No pericardial or pleural effusion. No hiatal hernia.  Abdomen: No acute perinephric inflammatory process, obstructive hydronephrosis, hydroureter, or obstructing ureteral calculus on either side. Ureters are symmetric and decompressed. Punctate nonobstructing intrarenal calculi within both kidneys, 4 on the left and 1 on the right. Kidneys are symmetric in size and shape. No renal atrophy or asymmetry.  Liver, collapsed gallbladder, biliary system, pancreas, spleen, and adrenal glands are within normal limits for age and noncontrast imaging.  Negative for bowel obstruction, dilatation, ileus, or free air.  No abdominal free fluid, fluid collection, hemorrhage, abscess or adenopathy.  normal appendix demonstrated.  Normal aorta.  Negative for aneurysm. Stable small retroperitoneal periaortic lymph nodes with fatty hila.  Pelvis: No distal pelvic dilated ureter or ureteral calculus. No definite UVJ abnormality. Urinary bladder glands. No pelvic free fluid, fluid collection, hemorrhage, abscess, adenopathy, inguinal abnormality, or hernia. Stool-filled distal colon. No acute distal bowel process.  No acute or abnormal osseous finding.  IMPRESSION: Punctate nonobstructing intrarenal calculi bilaterally.  No acute obstructing ureteral calculus, hydronephrosis, or obstructive uropathy.  No acute intra-abdominal or pelvic finding by noncontrast CT.   Electronically Signed   By: Ruel Favorsrevor  Shick M.D.   On: 12/28/2013 08:22    0830:  Pt  continues to appear very comfortable while in the ED, watching TV, NAD. Has tol PO well without N/V. Wants to go home now. Dx and testing d/w pt.  Questions answered.  Verb understanding, agreeable to d/c home with outpt f/u.   Samuel JesterKathleen Maricus Tanzi, DO 01/01/14 [phone]

## 2013-12-28 NOTE — ED Notes (Signed)
Pt was seen Monday and informed he had kidney stone. Pt reports pain has increased today with nausea as well.

## 2013-12-28 NOTE — Discharge Instructions (Signed)
°Emergency Department Resource Guide °1) Find a Doctor and Pay Out of Pocket °Although you won't have to find out who is covered by your insurance plan, it is a good idea to ask around and get recommendations. You will then need to call the office and see if the doctor you have chosen will accept you as a new patient and what types of options they offer for patients who are self-pay. Some doctors offer discounts or will set up payment plans for their patients who do not have insurance, but you will need to ask so you aren't surprised when you get to your appointment. ° °2) Contact Your Local Health Department °Not all health departments have doctors that can see patients for sick visits, but many do, so it is worth a call to see if yours does. If you don't know where your local health department is, you can check in your phone book. The CDC also has a tool to help you locate your state's health department, and many state websites also have listings of all of their local health departments. ° °3) Find a Walk-in Clinic °If your illness is not likely to be very severe or complicated, you may want to try a walk in clinic. These are popping up all over the country in pharmacies, drugstores, and shopping centers. They're usually staffed by nurse practitioners or physician assistants that have been trained to treat common illnesses and complaints. They're usually fairly quick and inexpensive. However, if you have serious medical issues or chronic medical problems, these are probably not your best option. ° °No Primary Care Doctor: °- Call [facility] Connect at  832-8000 - they can help you locate a primary care doctor that  accepts your insurance, provides certain services, etc. °- Physician Referral Service- 1-800-533-3463 ° °Chronic Pain Problems: °Organization         Address  Phone   Notes  °[facility]  ([phone]) [phone] Patients need to be referred by their primary care doctor.  ° °Medication  Assistance: °Organization         Address  Phone   Notes  °[place] [facility] [address] °[place], [place] [address] (336) 641-8030 --Must be a resident of [place] °-- Must have NO insurance coverage whatsoever (no Medicaid/ Medicare, etc.) °-- The pt. MUST have a primary care doctor that directs their care regularly and follows them in the community °  °[facility]  (866) 331-1348   °[employer]  (888) 892-1162   ° °Agencies that provide inexpensive medical care: °Organization         Address  Phone   Notes  °[facility]  (336) 832-8035   °[facility]    (336) 832-7272   °[facility] [address] °[place], [place] [address] (336) 832-4777   °[facility] [address], °[place] (336) 271-4999   °[facility]    (336) 373-0678   °[facility]    (336) 272-1050   °[facility] ° [address], [place] Phone:  (336) 832-4444, Fax:  (336) 832-4440 Hours of Operation:  9 am - 6 pm, M-F.  Also accepts Medicaid/Medicare and self-pay.  °[facility] ° [address], [place] Phone: (336) [phone], Fax: (336) [phone]. Hours of Operation:  8:30 am - 5:30 pm, M-F.  Also accepts Medicaid and self-pay.  °[facility] [place] [address], [place] Phone: (336) 878-6027   °[facility] [address], [place], [place] (336)723-1848, Ext. 123 Mondays & Thursdays: 7-9 AM.  First 15 patients are seen on a first come, first serve basis. °  ° °Medicaid-accepting [place] Providers: ° °Organization         Address  Phone   Notes  °[facility] [address], [place] (336) 641-2100 Also accepts self-pay patients.  °[facility] [address], [place] ° (336) 856-9996   °[facility] [address], [place]  (336) 288-8857   °[facility] [address] [place] [address], [place] (336) 299-7000   °Veita Bland [address], [place]  ° (336) 373-1557 Only accepts [place] Access Medicaid patients after they have their name applied to their card.  ° °Self-Pay (no insurance) in [place]: ° °Organization         Address  Phone   Notes  °Sickle Cell Patients, [facility] [address], [place] (336) 832-1970   °[facility] [address], [place] (336) 832-4400   °[facility] ° [address], [place] (336) 992-4800   °[facility]/Dr. Osei-Bonsu ° [address] [place] [address], [place] or [address], [place] (336) [phone] Phone number for both [place] and [place] locations is the same.  °[facility] [address], [place] (336) 299-0000   °[facility] [address] [place] [address], [place] or [address] (336) 852-7530 °(336) 878-2260   °[facility] [address], [place] (336) 350-1642, phone; (336) 294-5005, fax Sees patients 1st and 3rd Saturday of every month.  Must not qualify for public or private insurance (i.e. Medicaid, Medicare, [place] Health Choice, Veterans' Benefits) • Household income should be no more than 200% of the poverty level •The clinic cannot treat you if you are pregnant or think you are pregnant • Sexually transmitted diseases are not treated at the clinic.  ° ° °Dental Care: °Organization         Address  Phone  Notes  °[place] [facility] [address], [place] (336) 641-6152 Accepts children up to age 21 who are enrolled in Medicaid or [place] Health Choice; pregnant women with a Medicaid card; and children who have applied for Medicaid or [place] Health Choice, but were declined, whose parents can pay a reduced fee at time of service.  °[place]  [facility] [place]  [address], [place] (336) 641-7733 Accepts children up to age 21 who are enrolled in Medicaid or [place] Health Choice; pregnant women with a Medicaid card; and children who have applied for Medicaid or [place] Health Choice, but were declined, whose parents can pay a reduced fee at time of service.  °[facility] ° [address], [place] (336) 641-4533 Patients are seen by appointment only. Walk-ins are not accepted. [facility] will see patients 18 years of age and older. °Monday - Tuesday (8am-5pm) °Most Wednesdays (8:30-5pm) °$30 per visit, cash only  °[facility] ° [address], [place] (336) 641-4533 Patients are seen by appointment only. Walk-ins are not accepted. [facility] will see patients 18 years of age and older. °One   Wednesday Evening (Monthly: Volunteer Based).  $30 per visit, cash only  °[facility]  (919) [phone] for adults; Children under age 4, call Graduate Pediatric Dentistry at ([phone]) [phone]. Children aged 4-14, please call ([phone]) [phone] to request a pediatric application. ° Dental services are provided in all areas of dental care including fillings, crowns and bridges, complete and partial dentures, implants, gum treatment, root canals, and extractions. Preventive care is also provided. Treatment is provided to both adults and children. °Patients are selected via a lottery and there is often a waiting list. °  °[facility] [address], °[place] ° (336) 763-8833 [email or web address] °  °[facility] [address], [place], [place] (336)723-1848, Ext. 123 Second and Fourth Thursday of each month, opens at 6:30 AM; Clinic ends at 9 AM.  Patients are seen on a first-come first-served basis, and a limited number are seen during each clinic.  ° °[facility] ° [address], [place], [place] (336) 723-7904    Eligibility Requirements °You must have lived in [place], [place], or [place] counties for at least the last three months. °  You cannot be eligible for state or federal sponsored healthcare insurance, including Veterans Administration, Medicaid, or Medicare. °  You generally cannot be eligible for healthcare insurance through your employer.  °  How to apply: °Eligibility screenings are held every Tuesday and Wednesday afternoon from 1:00 pm until 4:00 pm. You do not need an appointment for the interview!  °[facility] [address], [place], [place] 336-631-2330   °[facility]  336-342-8273   °[facility]  336-703-3100   °[facility]  336-570-6415   ° °Behavioral Health Resources in the Community: °Intensive Outpatient Programs °Organization         Address  Phone  Notes  °[facility] [address], [place], [place] 336-878-6098   °[facility] [address], [place], [place] 336-832-9800   °[facility] [address], [place], [place] ° 336-882-2125   °[facility] [address],  °[place], [place] 1-800-853-5163 or 336-641-4981   °Substance Abuse Resources °Organization         Address  Phone  Notes  °[facility]  336-882-2125   °[facility]  336-784-9470   °[facility]  336-285-9073   °[facility]  336-845-3988   °Residential & Outpatient Substance Abuse Program  1-800-659-3381   °Psychological Services °Organization         Address  Phone  Notes  °[facility]  336- 832-9600   °[facility]  336- 378-7881   °[facility] [address], [place] 1-800-853-5163 or 336-641-4981   ° °Mobile Crisis Teams °Organization         Address  Phone  Notes  °[facility], Mobile Crisis Care Unit  1-877-626-1772   °Assertive °[facility] ° [address]  [place], [place] 336-834-9664   °Sharon DeEsch [address] °[place] 336-554-5454   ° °Self-Help/Support Groups °Organization         Address  Phone             Notes  °[facility]  - variety of support groups  336- 373-1402 Call for more information  °Narcotics Anonymous (NA), [facility] [address], °[place]  2 meetings at this location  ° °  Residential Treatment Programs Organization         Address  Phone  Notes  [facility] [address],    [place]  [phone]   [facility]  [address], [place] [phone], [place], [place] [phone]   [facility] [address], [place] [phone] Admissions: 8am-3pm M-F  [facility] [address]    [place], [place] [phone]   [facility] [address], [place], [place] [phone]   [facility] [address]  [place], [place] [phone]   [facility] Intensive Outpatient [address], [place], [place] [phone]   [facility] [address]  [place], [place] [phone])[phone] or [phone]   [facility] [address] [place], [place] [phone] Accepts Medicaid  [facility] [address]  [place] [phone] Substance Abuse/Addiction Treatment   [facility] Resources Organization         Address  Phone  Notes  [facility]  (360) [phone](888) (213) 027-3242   Angie FavaJulie Brannon, PhD [address], [address] [place], [place]   ([phone](336) [phone] or (318) [phone](336) ([phone]   [facility]   [address] [place], [place] [phone](336) [phone]   [facility] [address], [place], [place] ([phone](336) [phone] Insurance/Medicaid/sponsorship through [facility]  [facility] [address]                                    [place], [place] [phone]) [phone] Therapy/tele-psych/case    [facility] [address]   [place], [place] [phone](336) [phone]    Dr. Lolly MustacheArfeen  [phone]) [phone]   [facility]  [employer] [facility] 1) [address], [place] 2) [address], [place] 3)  [address], [place] (346)[phone](336) [phone] [phone](336) [phone]  [phone](336) [phone]   [facility] Child Abuse Hotline [phone](336) [phone] or [phone](336) [phone] (After Hours)      Take the prescriptions as directed.  Apply moist heat or ice to the area(s) of discomfort, for 15 minutes at a time, several times per day for the next few days.  Do not fall asleep on a heating or ice pack.  Call the Urologist today to schedule a follow up appointment within the next week.  Return to the Emergency Department immediately if worsening.

## 2013-12-29 LAB — URINE CULTURE
Colony Count: NO GROWTH
Culture: NO GROWTH

## 2014-01-02 ENCOUNTER — Emergency Department (HOSPITAL_COMMUNITY)
Admission: EM | Admit: 2014-01-02 | Discharge: 2014-01-02 | Disposition: A | Discharge status: Home / Self Care | Payer: Self-pay | Attending: Emergency Medicine | Admitting: Emergency Medicine

## 2014-01-02 ENCOUNTER — Encounter (HOSPITAL_COMMUNITY): Payer: Self-pay

## 2014-01-02 DIAGNOSIS — Z8709 Personal history of other diseases of the respiratory system: Secondary | ICD-10-CM | POA: Insufficient documentation

## 2014-01-02 DIAGNOSIS — Z87442 Personal history of urinary calculi: Secondary | ICD-10-CM | POA: Insufficient documentation

## 2014-01-02 DIAGNOSIS — Z791 Long term (current) use of non-steroidal anti-inflammatories (NSAID): Secondary | ICD-10-CM | POA: Insufficient documentation

## 2014-01-02 DIAGNOSIS — R519 Headache, unspecified: Secondary | ICD-10-CM

## 2014-01-02 DIAGNOSIS — R51 Headache: Secondary | ICD-10-CM | POA: Insufficient documentation

## 2014-01-02 DIAGNOSIS — G8929 Other chronic pain: Secondary | ICD-10-CM | POA: Insufficient documentation

## 2014-01-02 DIAGNOSIS — Z7982 Long term (current) use of aspirin: Secondary | ICD-10-CM | POA: Insufficient documentation

## 2014-01-02 DIAGNOSIS — Z79899 Other long term (current) drug therapy: Secondary | ICD-10-CM | POA: Insufficient documentation

## 2014-01-02 DIAGNOSIS — Z7952 Long term (current) use of systemic steroids: Secondary | ICD-10-CM | POA: Insufficient documentation

## 2014-01-02 MED ORDER — ACETAMINOPHEN 500 MG PO TABS: 1000.0000 mg | ORAL_TABLET | Freq: Once | ORAL | Status: DC

## 2014-01-02 MED ORDER — KETOROLAC TROMETHAMINE 30 MG/ML IJ SOLN
60.0000 mg | Freq: Once | INTRAMUSCULAR | Status: AC
  Administered 2014-01-02: 60 mg via INTRAMUSCULAR
  Filled 2014-01-02: qty 2

## 2014-01-02 NOTE — ED Provider Notes (Signed)
CSN: [id number]     Arrival date & time 01/02/14  [id number] History   First MD Initiated Contact with Patient 01/02/14 0346     Chief Complaint  Patient presents with  . Headache     (Consider location/radiation/quality/duration/timing/severity/associated sxs/prior Treatment) HPI Comments: Patient is a 28 year old male who presents with complaints of headache for the past several hours. This occurred in the absence of any injury or trauma. He denies any fevers, chills, or stiff neck. He denies any visual disturbances, nausea, or vomiting.  Patient is a 28 y.o. male presenting with headaches. The history is provided by the patient.  Headache Pain location:  Generalized Quality:  Dull Radiates to:  Does not radiate Onset quality:  Gradual Duration:  4 hours Timing:  Constant Progression:  Unchanged Chronicity:  New Similar to prior headaches: no   Context: activity and bright light   Relieved by:  Nothing Worsened by:  Nothing tried Ineffective treatments:  None tried Associated symptoms: abdominal pain   Associated symptoms: no fever     Past Medical History  Diagnosis Date  . Strep throat   . Kidney stone   . Chronic flank pain    History reviewed. No pertinent past surgical history. Family History  Problem Relation Age of Onset  . Diabetes Father    History  Substance Use Topics  . Smoking status: Current Every Day Smoker -- 1.00 packs/day    Types: Cigarettes  . Smokeless tobacco: Not on file  . Alcohol Use: No    Review of Systems  Constitutional: Negative for fever.  Gastrointestinal: Positive for abdominal pain.  Neurological: Positive for headaches.  All other systems reviewed and are negative.     Allergies  Review of patient's allergies indicates no known allergies.  Home Medications   Prior to Admission medications   Medication Sig Start Date End Date Taking? Authorizing Provider  aspirin 325 MG tablet Take 325-650 mg by mouth daily as needed for  moderate pain.   Yes Historical Provider, MD  Aspirin-Salicylamide-Caffeine (BC HEADACHE POWDER PO) Take 2 packets by mouth daily as needed (pain).   Yes Historical Provider, MD  ondansetron (ZOFRAN) 4 MG tablet Take 1 tablet (4 mg total) by mouth every 8 (eight) hours as needed for nausea or vomiting. 12/28/13  Yes Samuel JesterKathleen McManus, DO  oxyCODONE-acetaminophen (PERCOCET/ROXICET) 5-325 MG per tablet Take 1-2 tablets by mouth every 6 (six) hours as needed for moderate pain or severe pain. 12/25/13  Yes Geoffery Lyonsouglas Anubis Fundora, MD  promethazine (PHENERGAN) 25 MG tablet Take 1 tablet (25 mg total) by mouth every 6 (six) hours as needed. 11/14/13  Yes Donnetta HutchingBrian Cook, MD  cyclobenzaprine (FLEXERIL) 5 MG tablet Take 1 tablet (5 mg total) by mouth 3 (three) times daily as needed for muscle spasms. 12/04/13   Kristen N Ward, DO  dexamethasone (DECADRON) 4 MG tablet 1 po bid with food 12/05/13   Kathie DikeHobson M Bryant, PA-C  diclofenac (VOLTAREN) 75 MG EC tablet Take 1 tablet (75 mg total) by mouth 2 (two) times daily. 12/05/13   Kathie DikeHobson M Bryant, PA-C  ibuprofen (ADVIL,MOTRIN) 600 MG tablet Take 1 tablet (600 mg total) by mouth every 6 (six) hours as needed. 12/08/13   Loren Raceravid Yelverton, MD  methocarbamol (ROBAXIN) 500 MG tablet Take 2 tablets (1,000 mg total) by mouth every 8 (eight) hours as needed for muscle spasms. 12/08/13   Loren Raceravid Yelverton, MD  naproxen (NAPROSYN) 250 MG tablet Take 1 tablet (250 mg total) by mouth 2 (two) times daily  as needed for mild pain or moderate pain (take with food). 12/28/13   Samuel JesterKathleen McManus, DO   BP 157/95 mmHg  Pulse 88  Temp(Src) 98.1 F (36.7 C) (Oral)  Resp 18  Ht 5\' 11"  (1.803 m)  Wt 205 lb (92.987 kg)  BMI 28.60 kg/m2  SpO2 99% Physical Exam  Constitutional: He is oriented to person, place, and time. He appears well-developed and well-nourished. No distress.  HENT:  Head: Normocephalic and atraumatic.  Mouth/Throat: Oropharynx is clear and moist.  Eyes: EOM are normal. Pupils are  equal, round, and reactive to light.  Neck: Normal range of motion. Neck supple.  Musculoskeletal: Normal range of motion. He exhibits no edema.  Lymphadenopathy:    He has no cervical adenopathy.  Neurological: He is alert and oriented to person, place, and time. No cranial nerve deficit. He exhibits normal muscle tone. Coordination normal.  Skin: Skin is warm and dry. He is not diaphoretic.  Nursing note and vitals reviewed.   ED Course  Procedures (including critical care time) Labs Review Labs Reviewed - No data to display  Imaging Review No results found.   EKG Interpretation None      MDM   Final diagnoses:  Acute nonintractable headache, unspecified headache type    Patient presents with complaints of headache for several hours. He presents to the ER without having taken any medications at home. His neurologic exam is nonfocal and I do not feel as though imaging or LP is indicated. He was given a shot of Toradol and is feeling better. He will be discharged home with when necessary return.    Geoffery Lyonsouglas Mallery Harshman, MD 01/02/14 [phone]

## 2014-01-02 NOTE — Discharge Instructions (Signed)
Tylenol 1000 mg rotated with Motrin 600 mg every 4 hours as needed for pain.   General Headache Without Cause A headache is pain or discomfort felt around the head or neck area. The specific cause of a headache may not be found. There are many causes and types of headaches. A few common ones are:  Tension headaches.  Migraine headaches.  Cluster headaches.  Chronic daily headaches. HOME CARE INSTRUCTIONS   Keep all follow-up appointments with your caregiver or any specialist referral.  Only take over-the-counter or prescription medicines for pain or discomfort as directed by your caregiver.  Lie down in a dark, quiet room when you have a headache.  Keep a headache journal to find out what may trigger your migraine headaches. For example, write down:  What you eat and drink.  How much sleep you get.  Any change to your diet or medicines.  Try massage or other relaxation techniques.  Put ice packs or heat on the head and neck. Use these 3 to 4 times per day for 15 to 20 minutes each time, or as needed.  Limit stress.  Sit up straight, and do not tense your muscles.  Quit smoking if you smoke.  Limit alcohol use.  Decrease the amount of caffeine you drink, or stop drinking caffeine.  Eat and sleep on a regular schedule.  Get 7 to 9 hours of sleep, or as recommended by your caregiver.  Keep lights dim if bright lights bother you and make your headaches worse. SEEK MEDICAL CARE IF:   You have problems with the medicines you were prescribed.  Your medicines are not working.  You have a change from the usual headache.  You have nausea or vomiting. SEEK IMMEDIATE MEDICAL CARE IF:   Your headache becomes severe.  You have a fever.  You have a stiff neck.  You have loss of vision.  You have muscular weakness or loss of muscle control.  You start losing your balance or have trouble walking.  You feel faint or pass out.  You have severe symptoms that are  different from your first symptoms. MAKE SURE YOU:   Understand these instructions.  Will watch your condition.  Will get help right away if you are not doing well or get worse. Document Released: 01/26/2005 Document Revised: 04/20/2011 Document Reviewed: 02/11/2011 [facility] Patient Information 2015 [employer], [place]. This information is not intended to replace advice given to you by your health care provider. Make sure you discuss any questions you have with your health care provider.

## 2014-01-02 NOTE — ED Notes (Signed)
Patient states headache that started at 2300 yesterday and sinus congestion. Patient c/o pain to posterior neck. Patient drove self to ED.

## 2014-01-16 ENCOUNTER — Emergency Department (HOSPITAL_COMMUNITY)
Admission: EM | Admit: 2014-01-16 | Discharge: 2014-01-16 | Disposition: A | Discharge status: Home / Self Care | Payer: Self-pay | Attending: Emergency Medicine | Admitting: Emergency Medicine

## 2014-01-16 ENCOUNTER — Encounter (HOSPITAL_COMMUNITY): Payer: Self-pay | Admitting: *Deleted

## 2014-01-16 ENCOUNTER — Emergency Department (HOSPITAL_COMMUNITY): Payer: Self-pay

## 2014-01-16 DIAGNOSIS — Z7982 Long term (current) use of aspirin: Secondary | ICD-10-CM | POA: Insufficient documentation

## 2014-01-16 DIAGNOSIS — Z79899 Other long term (current) drug therapy: Secondary | ICD-10-CM | POA: Insufficient documentation

## 2014-01-16 DIAGNOSIS — Z791 Long term (current) use of non-steroidal anti-inflammatories (NSAID): Secondary | ICD-10-CM | POA: Insufficient documentation

## 2014-01-16 DIAGNOSIS — Z720 Tobacco use: Secondary | ICD-10-CM | POA: Insufficient documentation

## 2014-01-16 DIAGNOSIS — N200 Calculus of kidney: Secondary | ICD-10-CM | POA: Insufficient documentation

## 2014-01-16 DIAGNOSIS — R109 Unspecified abdominal pain: Secondary | ICD-10-CM

## 2014-01-16 DIAGNOSIS — G8929 Other chronic pain: Secondary | ICD-10-CM | POA: Insufficient documentation

## 2014-01-16 DIAGNOSIS — Z8709 Personal history of other diseases of the respiratory system: Secondary | ICD-10-CM | POA: Insufficient documentation

## 2014-01-16 LAB — URINALYSIS, ROUTINE W REFLEX MICROSCOPIC
Bilirubin Urine: NEGATIVE
GLUCOSE, UA: NEGATIVE mg/dL
Hgb urine dipstick: NEGATIVE
KETONES UR: NEGATIVE mg/dL
LEUKOCYTES UA: NEGATIVE
NITRITE: NEGATIVE
PH: 5.5 (ref 5.0–8.0)
Protein, ur: NEGATIVE mg/dL
Urobilinogen, UA: 0.2 mg/dL (ref 0.0–1.0)

## 2014-01-16 MED ORDER — TAMSULOSIN HCL 0.4 MG PO CAPS: 0.4000 mg | ORAL_CAPSULE | Freq: Every day | ORAL | Status: DC

## 2014-01-16 MED ORDER — KETOROLAC TROMETHAMINE 30 MG/ML IJ SOLN
30.0000 mg | Freq: Once | INTRAMUSCULAR | Status: AC
  Administered 2014-01-16: 30 mg via INTRAMUSCULAR

## 2014-01-16 MED ORDER — KETOROLAC TROMETHAMINE 30 MG/ML IJ SOLN
30.0000 mg | Freq: Once | INTRAMUSCULAR | Status: DC
  Filled 2014-01-16: qty 1

## 2014-01-16 MED ORDER — HYDROCODONE-ACETAMINOPHEN 5-325 MG PO TABS
1.0000 | ORAL_TABLET | Freq: Once | ORAL | Status: AC
  Administered 2014-01-16: 1 via ORAL
  Filled 2014-01-16: qty 1

## 2014-01-16 MED ORDER — HYDROCODONE-ACETAMINOPHEN 5-325 MG PO TABS: 1.0000 | ORAL_TABLET | Freq: Four times a day (QID) | ORAL | Status: DC | PRN

## 2014-01-16 NOTE — Discharge Instructions (Signed)
As discussed, it is important that you follow up as soon as possible with a urologist for continued management of your condition.  You should also discuss methods that may reduce the likelihood of recurrence of kidney stones.  If you develop any new, or concerning changes in your condition, please return to the emergency department immediately.

## 2014-01-16 NOTE — ED Notes (Signed)
Pt states he is having left flank pain stays on side. Pt denies any blood in urine at this time.

## 2014-01-16 NOTE — ED Notes (Signed)
MD at bedside. 

## 2014-01-16 NOTE — ED Provider Notes (Signed)
CSN: [id number]     Arrival date & time 01/16/14  1330 History   First MD Initiated Contact with Patient 01/16/14 1337     Chief Complaint  Patient presents with  . Flank Pain     (Consider location/radiation/quality/duration/timing/severity/associated sxs/prior Treatment) HPI Patient presents with concern of new left flank pain.  This episode began approximately 4 hours ago.  Since onset pain has been persistently in the left flank, nonradiating, sore. No other complaints. No attempt at relief with anything. Patient acknowledges a history of kidney stones, has not followed up with urology recently.  Past Medical History  Diagnosis Date  . Strep throat   . Kidney stone   . Chronic flank pain    History reviewed. No pertinent past surgical history. Family History  Problem Relation Age of Onset  . Diabetes Father    History  Substance Use Topics  . Smoking status: Current Every Day Smoker -- 1.00 packs/day    Types: Cigarettes  . Smokeless tobacco: Not on file  . Alcohol Use: No    Review of Systems  Constitutional:       Per HPI, otherwise negative  HENT:       Per HPI, otherwise negative  Respiratory:       Per HPI, otherwise negative  Cardiovascular:       Per HPI, otherwise negative  Gastrointestinal: Negative for vomiting.  Endocrine:       Negative aside from HPI  Genitourinary:       Neg aside from HPI   Musculoskeletal:       Per HPI, otherwise negative  Skin: Negative.   Neurological: Negative for syncope.      Allergies  Review of patient's allergies indicates no known allergies.  Home Medications   Prior to Admission medications   Medication Sig Start Date End Date Taking? Authorizing Provider  aspirin 325 MG tablet Take 325-650 mg by mouth daily as needed for moderate pain.   Yes Historical Provider, MD  Aspirin-Salicylamide-Caffeine (BC HEADACHE POWDER PO) Take 2 packets by mouth daily as needed (pain).   Yes Historical Provider, MD   cyclobenzaprine (FLEXERIL) 5 MG tablet Take 1 tablet (5 mg total) by mouth 3 (three) times daily as needed for muscle spasms. Patient not taking: Reported on 01/16/2014 12/04/13   Layla MawKristen N Ward, DO  dexamethasone (DECADRON) 4 MG tablet 1 po bid with food Patient not taking: Reported on 01/16/2014 12/05/13   Kathie DikeHobson M Bryant, PA-C  diclofenac (VOLTAREN) 75 MG EC tablet Take 1 tablet (75 mg total) by mouth 2 (two) times daily. Patient not taking: Reported on 01/16/2014 12/05/13   Kathie DikeHobson M Bryant, PA-C  ibuprofen (ADVIL,MOTRIN) 600 MG tablet Take 1 tablet (600 mg total) by mouth every 6 (six) hours as needed. Patient not taking: Reported on 01/16/2014 12/08/13   Loren Raceravid Yelverton, MD  methocarbamol (ROBAXIN) 500 MG tablet Take 2 tablets (1,000 mg total) by mouth every 8 (eight) hours as needed for muscle spasms. Patient not taking: Reported on 01/16/2014 12/08/13   Loren Raceravid Yelverton, MD  naproxen (NAPROSYN) 250 MG tablet Take 1 tablet (250 mg total) by mouth 2 (two) times daily as needed for mild pain or moderate pain (take with food). Patient not taking: Reported on 01/16/2014 12/28/13   Samuel JesterKathleen McManus, DO  ondansetron (ZOFRAN) 4 MG tablet Take 1 tablet (4 mg total) by mouth every 8 (eight) hours as needed for nausea or vomiting. Patient not taking: Reported on 01/16/2014 12/28/13   Samuel JesterKathleen McManus, DO  oxyCODONE-acetaminophen (PERCOCET/ROXICET) 5-325 MG per tablet Take 1-2 tablets by mouth every 6 (six) hours as needed for moderate pain or severe pain. Patient not taking: Reported on 01/16/2014 12/25/13   Geoffery Lyonsouglas Delo, MD  promethazine (PHENERGAN) 25 MG tablet Take 1 tablet (25 mg total) by mouth every 6 (six) hours as needed. Patient not taking: Reported on 01/16/2014 11/14/13   Donnetta HutchingBrian Cook, MD   BP 149/96 mmHg  Pulse 96  Temp(Src) 98.2 F (36.8 C) (Oral)  Resp 18  Ht 5\' 11"  (1.803 m)  Wt 202 lb (91.627 kg)  BMI 28.19 kg/m2  SpO2 95% Physical Exam  Constitutional: He is oriented to person, place,  and time. He appears well-developed. No distress.  HENT:  Head: Normocephalic and atraumatic.  Eyes: Conjunctivae and EOM are normal.  Cardiovascular: Normal rate and regular rhythm.   Pulmonary/Chest: Effort normal. No stridor. No respiratory distress.  Abdominal: He exhibits no distension.  Minimal tenderness to palpation about the left flank, with no deformity, ecchymosis, superficial changes.  Musculoskeletal: He exhibits no edema.  Neurological: He is alert and oriented to person, place, and time.  Skin: Skin is warm and dry.  Psychiatric: He has a normal mood and affect.  Nursing note and vitals reviewed.   ED Course  Procedures (including critical care time) Labs Review Labs Reviewed  URINALYSIS, ROUTINE W REFLEX MICROSCOPIC    Imaging Review No results found.  A review of the patient's chart demonstrates 16 visits for similar concerns, including multiple prior CT scans, ultrasound, for kidney stone. Last CT scan, within the past month demonstrates punctate stones, no obstructive pathology.   MDM   Final diagnoses:  Flank pain  kidney stone  Patient presents with a new left flank pain.  Patient has a history of kidney stones, and today's evaluation actually demonstrates a 9 mm stone, nonobstructing. No evidence for concurrent infection. Patient has not followed up with urology, but was encouraged to do so. Patient was discharged in stable condition.      Gerhard Munchobert Jacquelina Hewins, MD 01/16/14 1444

## 2014-01-16 NOTE — ED Notes (Signed)
Pain lt lower back with N/v, hx of kidney stones and feels this is the  Same pain.

## 2014-01-18 ENCOUNTER — Emergency Department (HOSPITAL_COMMUNITY)
Admission: EM | Admit: 2014-01-18 | Discharge: 2014-01-18 | Disposition: A | Discharge status: Home / Self Care | Payer: Self-pay | Attending: Emergency Medicine | Admitting: Emergency Medicine

## 2014-01-18 ENCOUNTER — Encounter (HOSPITAL_COMMUNITY): Payer: Self-pay | Admitting: Emergency Medicine

## 2014-01-18 ENCOUNTER — Emergency Department (HOSPITAL_COMMUNITY): Payer: Self-pay

## 2014-01-18 DIAGNOSIS — G8929 Other chronic pain: Secondary | ICD-10-CM | POA: Insufficient documentation

## 2014-01-18 DIAGNOSIS — Z791 Long term (current) use of non-steroidal anti-inflammatories (NSAID): Secondary | ICD-10-CM | POA: Insufficient documentation

## 2014-01-18 DIAGNOSIS — Z87442 Personal history of urinary calculi: Secondary | ICD-10-CM | POA: Insufficient documentation

## 2014-01-18 DIAGNOSIS — Z79899 Other long term (current) drug therapy: Secondary | ICD-10-CM | POA: Insufficient documentation

## 2014-01-18 DIAGNOSIS — R109 Unspecified abdominal pain: Secondary | ICD-10-CM

## 2014-01-18 DIAGNOSIS — R1032 Left lower quadrant pain: Secondary | ICD-10-CM | POA: Insufficient documentation

## 2014-01-18 DIAGNOSIS — Z720 Tobacco use: Secondary | ICD-10-CM | POA: Insufficient documentation

## 2014-01-18 DIAGNOSIS — Z8709 Personal history of other diseases of the respiratory system: Secondary | ICD-10-CM | POA: Insufficient documentation

## 2014-01-18 LAB — URINALYSIS, ROUTINE W REFLEX MICROSCOPIC
BILIRUBIN URINE: NEGATIVE
Glucose, UA: NEGATIVE mg/dL
Hgb urine dipstick: NEGATIVE
Ketones, ur: NEGATIVE mg/dL
Leukocytes, UA: NEGATIVE
NITRITE: NEGATIVE
PH: 6 (ref 5.0–8.0)
Protein, ur: NEGATIVE mg/dL
SPECIFIC GRAVITY, URINE: 1.025 (ref 1.005–1.030)
Urobilinogen, UA: 0.2 mg/dL (ref 0.0–1.0)

## 2014-01-18 MED ORDER — OXYCODONE-ACETAMINOPHEN 5-325 MG PO TABS: 2.0000 | ORAL_TABLET | ORAL | Status: DC | PRN

## 2014-01-18 MED ORDER — HYDROMORPHONE HCL 1 MG/ML IJ SOLN
1.0000 mg | Freq: Once | INTRAMUSCULAR | Status: AC
  Administered 2014-01-18: 1 mg via INTRAMUSCULAR
  Filled 2014-01-18: qty 1

## 2014-01-18 MED ORDER — PROMETHAZINE HCL 25 MG PO TABS: 25.0000 mg | ORAL_TABLET | Freq: Four times a day (QID) | ORAL | Status: DC | PRN

## 2014-01-18 NOTE — Care Management Note (Signed)
ED/CM noted patient did not have health insurance and/or PCP listed in the computer.  Patient was given the [place] resource handout with information on the clinics, food pantries, and the handout for new health insurance sign-up. Pt was also given a Rx discount card. Patient expressed appreciation for information received. 

## 2014-01-18 NOTE — Discharge Instructions (Signed)
Medication for pain and nausea. Followup with urologist. Phone number given. °

## 2014-01-18 NOTE — ED Notes (Signed)
Pt seen in ED on 12/8, dx with left kidney stone. Pt c/o continued left groin pain and nausea today.

## 2014-01-18 NOTE — ED Provider Notes (Signed)
CSN: [id number]     Arrival date & time 01/18/14  [id number] History   First MD Initiated Contact with Patient 01/18/14 [phone]     Chief Complaint  Patient presents with  . Nephrolithiasis     (Consider location/radiation/quality/duration/timing/severity/associated sxs/prior Treatment) HPI.... Left flank pain with radiation to left lower quadrant and left testicle for 2 days. No hematuria, dysuria, fever, chills. Patient claims with a history of kidney stones. He has tried Flomax at home. No other somatic complaints. Severity is moderate. Nothing makes symptoms better or worse.  Past Medical History  Diagnosis Date  . Strep throat   . Kidney stone   . Chronic flank pain    History reviewed. No pertinent past surgical history. Family History  Problem Relation Age of Onset  . Diabetes Father    History  Substance Use Topics  . Smoking status: Current Every Day Smoker -- 1.00 packs/day    Types: Cigarettes  . Smokeless tobacco: Not on file  . Alcohol Use: No    Review of Systems  All other systems reviewed and are negative.     Allergies  Review of patient's allergies indicates no known allergies.  Home Medications   Prior to Admission medications   Medication Sig Start Date End Date Taking? Authorizing Provider  Aspirin-Salicylamide-Caffeine (BC HEADACHE POWDER PO) Take 2 packets by mouth daily as needed (pain).   Yes Historical Provider, MD  tamsulosin (FLOMAX) 0.4 MG CAPS capsule Take 1 capsule (0.4 mg total) by mouth daily. 01/16/14  Yes Gerhard Munchobert Lockwood, MD  cyclobenzaprine (FLEXERIL) 5 MG tablet Take 1 tablet (5 mg total) by mouth 3 (three) times daily as needed for muscle spasms. Patient not taking: Reported on 01/16/2014 12/04/13   Layla MawKristen N Ward, DO  dexamethasone (DECADRON) 4 MG tablet 1 po bid with food Patient not taking: Reported on 01/16/2014 12/05/13   Kathie DikeHobson M Bryant, PA-C  diclofenac (VOLTAREN) 75 MG EC tablet Take 1 tablet (75 mg total) by mouth 2 (two) times  daily. Patient not taking: Reported on 01/16/2014 12/05/13   Kathie DikeHobson M Bryant, PA-C  HYDROcodone-acetaminophen (NORCO/VICODIN) 5-325 MG per tablet Take 1 tablet by mouth every 6 (six) hours as needed for moderate pain. 01/16/14   Gerhard Munchobert Lockwood, MD  ibuprofen (ADVIL,MOTRIN) 600 MG tablet Take 1 tablet (600 mg total) by mouth every 6 (six) hours as needed. Patient not taking: Reported on 01/16/2014 12/08/13   Loren Raceravid Yelverton, MD  methocarbamol (ROBAXIN) 500 MG tablet Take 2 tablets (1,000 mg total) by mouth every 8 (eight) hours as needed for muscle spasms. Patient not taking: Reported on 01/16/2014 12/08/13   Loren Raceravid Yelverton, MD  naproxen (NAPROSYN) 250 MG tablet Take 1 tablet (250 mg total) by mouth 2 (two) times daily as needed for mild pain or moderate pain (take with food). Patient not taking: Reported on 01/16/2014 12/28/13   Samuel JesterKathleen McManus, DO  ondansetron (ZOFRAN) 4 MG tablet Take 1 tablet (4 mg total) by mouth every 8 (eight) hours as needed for nausea or vomiting. Patient not taking: Reported on 01/16/2014 12/28/13   Samuel JesterKathleen McManus, DO  oxyCODONE-acetaminophen (PERCOCET/ROXICET) 5-325 MG per tablet Take 1-2 tablets by mouth every 6 (six) hours as needed for moderate pain or severe pain. Patient not taking: Reported on 01/16/2014 12/25/13   Geoffery Lyonsouglas Delo, MD  promethazine (PHENERGAN) 25 MG tablet Take 1 tablet (25 mg total) by mouth every 6 (six) hours as needed. Patient not taking: Reported on 01/16/2014 11/14/13   Donnetta HutchingBrian Ndidi Nesby, MD   BP 137/87  mmHg  Pulse 78  Temp(Src) 98.2 F (36.8 C)  Resp 16  Ht 5\' 11"  (1.803 m)  Wt 202 lb (91.627 kg)  BMI 28.19 kg/m2  SpO2 95% Physical Exam  Constitutional: He is oriented to person, place, and time. He appears well-developed and well-nourished.  HENT:  Head: Normocephalic and atraumatic.  Eyes: Conjunctivae and EOM are normal. Pupils are equal, round, and reactive to light.  Neck: Normal range of motion. Neck supple.  Cardiovascular: Normal rate,  regular rhythm and normal heart sounds.   Pulmonary/Chest: Effort normal and breath sounds normal.  Abdominal: Soft. Bowel sounds are normal.  Minimal left lower quadrant tenderness  Musculoskeletal: Normal range of motion.  Neurological: He is alert and oriented to person, place, and time.  Skin: Skin is warm and dry.  Psychiatric: He has a normal mood and affect. His behavior is normal.  Nursing note and vitals reviewed.   ED Course  Procedures (including critical care time) Labs Review Labs Reviewed  URINALYSIS, ROUTINE W REFLEX MICROSCOPIC    Imaging Review Dg Abd 1 View  01/18/2014   CLINICAL DATA:  Left flank pain.  EXAM: ABDOMEN - 1 VIEW  COMPARISON:  None.  FINDINGS: The bowel gas pattern is normal. No radio-opaque calculi or other significant radiographic abnormality are seen.  IMPRESSION: No evidence of bowel obstruction or ileus. No renal calculi are noted.   Electronically Signed   By: Roque LiasJames  Green M.D.   On: 01/18/2014 10:06   [place] Renal  01/16/2014   CLINICAL DATA:  Left flank pain.  EXAM: RENAL/URINARY TRACT ULTRASOUND COMPLETE  COMPARISON:  Ultrasound of December 23, 2011.  FINDINGS: Right Kidney:  Length: 12.3 cm. Echogenicity within normal limits. No mass or hydronephrosis visualized.  Left Kidney:  Length: 13.2 cm. 9 mm nonobstructive calculus is noted in lower pole collecting system. Echogenicity within normal limits. No mass or hydronephrosis visualized.  Bladder:  Decompressed as patient has recently voided.  IMPRESSION: 9 mm nonobstructive left renal calculus. No hydronephrosis or renal obstruction is noted.   Electronically Signed   By: Roque LiasJames  Green M.D.   On: 01/16/2014 14:37     EKG Interpretation None      MDM   Final diagnoses:  Left flank pain    History suggestive of kidney stone pain. Ultrasound from 01/16/2014 reveals a 9 mm nonobstructive left renal stone. No ureteral stone. Urinalysis today negative. KUB negative. Rx Percocet and Phenergan 25  mg    Donnetta HutchingBrian Altin Sease, MD 01/18/14 1125

## 2014-01-18 NOTE — ED Notes (Signed)
Pt verbalized understanding of no driving and to use caution within 4 hours of taking pain meds due to meds cause drowsiness 

## 2014-01-22 ENCOUNTER — Emergency Department (HOSPITAL_COMMUNITY)
Admission: EM | Admit: 2014-01-22 | Discharge: 2014-01-22 | Disposition: A | Discharge status: Home / Self Care | Payer: Self-pay | Attending: Emergency Medicine | Admitting: Emergency Medicine

## 2014-01-22 ENCOUNTER — Encounter (HOSPITAL_COMMUNITY): Payer: Self-pay | Admitting: Emergency Medicine

## 2014-01-22 ENCOUNTER — Emergency Department (HOSPITAL_COMMUNITY): Payer: Self-pay

## 2014-01-22 DIAGNOSIS — Z791 Long term (current) use of non-steroidal anti-inflammatories (NSAID): Secondary | ICD-10-CM | POA: Insufficient documentation

## 2014-01-22 DIAGNOSIS — R1032 Left lower quadrant pain: Secondary | ICD-10-CM | POA: Insufficient documentation

## 2014-01-22 DIAGNOSIS — Z79899 Other long term (current) drug therapy: Secondary | ICD-10-CM | POA: Insufficient documentation

## 2014-01-22 DIAGNOSIS — Z87442 Personal history of urinary calculi: Secondary | ICD-10-CM | POA: Insufficient documentation

## 2014-01-22 DIAGNOSIS — R197 Diarrhea, unspecified: Secondary | ICD-10-CM | POA: Insufficient documentation

## 2014-01-22 DIAGNOSIS — Z720 Tobacco use: Secondary | ICD-10-CM | POA: Insufficient documentation

## 2014-01-22 DIAGNOSIS — Z8709 Personal history of other diseases of the respiratory system: Secondary | ICD-10-CM | POA: Insufficient documentation

## 2014-01-22 DIAGNOSIS — R109 Unspecified abdominal pain: Secondary | ICD-10-CM

## 2014-01-22 DIAGNOSIS — G8929 Other chronic pain: Secondary | ICD-10-CM | POA: Insufficient documentation

## 2014-01-22 DIAGNOSIS — R112 Nausea with vomiting, unspecified: Secondary | ICD-10-CM | POA: Insufficient documentation

## 2014-01-22 DIAGNOSIS — R52 Pain, unspecified: Secondary | ICD-10-CM

## 2014-01-22 LAB — COMPREHENSIVE METABOLIC PANEL
ALT: 31 U/L (ref 0–53)
ANION GAP: 12 (ref 5–15)
AST: 21 U/L (ref 0–37)
Albumin: 3.7 g/dL (ref 3.5–5.2)
Alkaline Phosphatase: 106 U/L (ref 39–117)
BUN: 12 mg/dL (ref 6–23)
CO2: 25 meq/L (ref 19–32)
Calcium: 9.2 mg/dL (ref 8.4–10.5)
Chloride: 105 mEq/L (ref 96–112)
Creatinine, Ser: 0.85 mg/dL (ref 0.50–1.35)
GLUCOSE: 137 mg/dL — AB (ref 70–99)
Potassium: 3.6 mEq/L — ABNORMAL LOW (ref 3.7–5.3)
Sodium: 142 mEq/L (ref 137–147)
TOTAL PROTEIN: 7.1 g/dL (ref 6.0–8.3)
Total Bilirubin: 0.2 mg/dL — ABNORMAL LOW (ref 0.3–1.2)

## 2014-01-22 LAB — URINALYSIS, ROUTINE W REFLEX MICROSCOPIC
Bilirubin Urine: NEGATIVE
Glucose, UA: NEGATIVE mg/dL
HGB URINE DIPSTICK: NEGATIVE
Ketones, ur: NEGATIVE mg/dL
Leukocytes, UA: NEGATIVE
NITRITE: NEGATIVE
PROTEIN: NEGATIVE mg/dL
SPECIFIC GRAVITY, URINE: 1.02 (ref 1.005–1.030)
UROBILINOGEN UA: 0.2 mg/dL (ref 0.0–1.0)
pH: 6 (ref 5.0–8.0)

## 2014-01-22 LAB — CBC WITH DIFFERENTIAL/PLATELET
Basophils Absolute: 0.1 10*3/uL (ref 0.0–0.1)
Basophils Relative: 0 % (ref 0–1)
EOS ABS: 0.6 10*3/uL (ref 0.0–0.7)
EOS PCT: 5 % (ref 0–5)
HEMATOCRIT: 46.2 % (ref 39.0–52.0)
HEMOGLOBIN: 15.5 g/dL (ref 13.0–17.0)
LYMPHS ABS: 2.3 10*3/uL (ref 0.7–4.0)
LYMPHS PCT: 19 % (ref 12–46)
MCH: 30.7 pg (ref 26.0–34.0)
MCHC: 33.5 g/dL (ref 30.0–36.0)
MCV: 91.5 fL (ref 78.0–100.0)
MONOS PCT: 7 % (ref 3–12)
Monocytes Absolute: 0.8 10*3/uL (ref 0.1–1.0)
Neutro Abs: 8.2 10*3/uL — ABNORMAL HIGH (ref 1.7–7.7)
Neutrophils Relative %: 69 % (ref 43–77)
Platelets: 225 10*3/uL (ref 150–400)
RBC: 5.05 MIL/uL (ref 4.22–5.81)
RDW: 13.2 % (ref 11.5–15.5)
WBC: 11.9 10*3/uL — AB (ref 4.0–10.5)

## 2014-01-22 MED ORDER — KETOROLAC TROMETHAMINE 30 MG/ML IJ SOLN: 30.0000 mg | Freq: Once | INTRAMUSCULAR | Status: AC

## 2014-01-22 MED ORDER — ONDANSETRON HCL 4 MG/2ML IJ SOLN
4.0000 mg | Freq: Once | INTRAMUSCULAR | Status: AC
  Administered 2014-01-22: 4 mg via INTRAVENOUS
  Filled 2014-01-22: qty 2

## 2014-01-22 MED ORDER — OXYCODONE-ACETAMINOPHEN 5-325 MG PO TABS
1.0000 | ORAL_TABLET | Freq: Once | ORAL | Status: AC
  Administered 2014-01-22: 1 via ORAL
  Filled 2014-01-22: qty 1

## 2014-01-22 MED ORDER — KETOROLAC TROMETHAMINE 60 MG/2ML IM SOLN
INTRAMUSCULAR | Status: AC
  Administered 2014-01-22: 30 mg via INTRAVENOUS
  Filled 2014-01-22: qty 2

## 2014-01-22 NOTE — ED Provider Notes (Signed)
CSN: [id number]     Arrival date & time 01/22/14  0708 History  This chart was scribed for Benny LennertJoseph L Stokely Jeancharles, MD by Ronney LionSuzanne Le, ED Scribe. This patient was seen in room APA03/[facility]03 and the patient's care was started at 7:20 AM.    Chief Complaint  Patient presents with  . Flank Pain   Patient is a 28 y.o. male presenting with flank pain. The history is provided by the patient. No language interpreter was used.  Flank Pain This is a recurrent problem. The current episode started 3 to 5 hours ago. The problem occurs constantly. Associated symptoms include abdominal pain. Pertinent negatives include no chest pain and no headaches.     HPI Comments: Everlene FarrierZachary R Emberson is a 28 y.o. male who presents to the Emergency Department complaining of left flank pain and an episode of emesis that occurred about 3 hours ago. Patient had taken Phenergan afterwards with no relief. He complains of associated nausea. Patient has a history of nephrolithiasis and was seen at the ED here last week for kidney stones. He has NKDA.    Past Medical History  Diagnosis Date  . Strep throat   . Kidney stone   . Chronic flank pain    History reviewed. No pertinent past surgical history. Family History  Problem Relation Age of Onset  . Diabetes Father    History  Substance Use Topics  . Smoking status: Current Every Day Smoker -- 1.00 packs/day    Types: Cigarettes  . Smokeless tobacco: Not on file  . Alcohol Use: No    Review of Systems  Constitutional: Negative for appetite change and fatigue.  HENT: Negative for congestion, ear discharge and sinus pressure.   Eyes: Negative for discharge.  Respiratory: Negative for cough.   Cardiovascular: Negative for chest pain.  Gastrointestinal: Positive for nausea, vomiting, abdominal pain and diarrhea.  Genitourinary: Positive for flank pain. Negative for frequency and hematuria.  Musculoskeletal: Negative for back pain.  Skin: Negative for rash.  Neurological:  Negative for seizures and headaches.  Psychiatric/Behavioral: Negative for hallucinations.  All other systems reviewed and are negative.     Allergies  Review of patient's allergies indicates no known allergies.  Home Medications   Prior to Admission medications   Medication Sig Start Date End Date Taking? Authorizing Provider  Aspirin-Salicylamide-Caffeine (BC HEADACHE POWDER PO) Take 2 packets by mouth daily as needed (pain).    Historical Provider, MD  cyclobenzaprine (FLEXERIL) 5 MG tablet Take 1 tablet (5 mg total) by mouth 3 (three) times daily as needed for muscle spasms. Patient not taking: Reported on 01/16/2014 12/04/13   Layla MawKristen N Ward, DO  dexamethasone (DECADRON) 4 MG tablet 1 po bid with food Patient not taking: Reported on 01/16/2014 12/05/13   Kathie DikeHobson M Bryant, PA-C  diclofenac (VOLTAREN) 75 MG EC tablet Take 1 tablet (75 mg total) by mouth 2 (two) times daily. Patient not taking: Reported on 01/16/2014 12/05/13   Kathie DikeHobson M Bryant, PA-C  HYDROcodone-acetaminophen (NORCO/VICODIN) 5-325 MG per tablet Take 1 tablet by mouth every 6 (six) hours as needed for moderate pain. 01/16/14   Gerhard Munchobert Lockwood, MD  ibuprofen (ADVIL,MOTRIN) 600 MG tablet Take 1 tablet (600 mg total) by mouth every 6 (six) hours as needed. Patient not taking: Reported on 01/16/2014 12/08/13   Loren Raceravid Yelverton, MD  methocarbamol (ROBAXIN) 500 MG tablet Take 2 tablets (1,000 mg total) by mouth every 8 (eight) hours as needed for muscle spasms. Patient not taking: Reported on 01/16/2014 12/08/13  Loren Raceravid Yelverton, MD  naproxen (NAPROSYN) 250 MG tablet Take 1 tablet (250 mg total) by mouth 2 (two) times daily as needed for mild pain or moderate pain (take with food). Patient not taking: Reported on 01/16/2014 12/28/13   Samuel JesterKathleen McManus, DO  ondansetron (ZOFRAN) 4 MG tablet Take 1 tablet (4 mg total) by mouth every 8 (eight) hours as needed for nausea or vomiting. Patient not taking: Reported on 01/16/2014 12/28/13    Samuel JesterKathleen McManus, DO  oxyCODONE-acetaminophen (PERCOCET) 5-325 MG per tablet Take 2 tablets by mouth every 4 (four) hours as needed. 01/18/14   Donnetta HutchingBrian Cook, MD  promethazine (PHENERGAN) 25 MG tablet Take 1 tablet (25 mg total) by mouth every 6 (six) hours as needed. 01/18/14   Donnetta HutchingBrian Cook, MD  tamsulosin (FLOMAX) 0.4 MG CAPS capsule Take 1 capsule (0.4 mg total) by mouth daily. 01/16/14   Gerhard Munchobert Lockwood, MD   BP 148/90 mmHg  Pulse 96  Temp(Src) 97.9 F (36.6 C) (Oral)  Resp 16  Ht 5\' 11"  (1.803 m)  Wt 200 lb (90.719 kg)  BMI 27.91 kg/m2  SpO2 98% Physical Exam  Constitutional: He is oriented to person, place, and time. He appears well-developed.  HENT:  Head: Normocephalic.  Eyes: Conjunctivae and EOM are normal. No scleral icterus.  Neck: Neck supple. No thyromegaly present.  Cardiovascular: Normal rate and regular rhythm.  Exam reveals no gallop and no friction rub.   No murmur heard. Pulmonary/Chest: No stridor. He has no wheezes. He has no rales. He exhibits no tenderness.  Abdominal: He exhibits no distension. There is no tenderness. There is no rebound.  Minor LLQ tenderness.  Musculoskeletal: Normal range of motion. He exhibits no edema.  Lymphadenopathy:    He has no cervical adenopathy.  Neurological: He is oriented to person, place, and time. He exhibits normal muscle tone. Coordination normal.  Skin: No rash noted. No erythema.  Psychiatric: He has a normal mood and affect. His behavior is normal.  Nursing note and vitals reviewed.   ED Course  Procedures (including critical care time)  DIAGNOSTIC STUDIES: Oxygen Saturation is 98% on room air, normal by my interpretation.    COORDINATION OF CARE: 7:23 AM - Discussed treatment plan with pt at bedside which includes US and pt agreed to plan.   Labs Review Labs Reviewed - No data to display  Imaging Review No results found.   EKG Interpretation None      MDM   Final diagnoses:  None   Flank pain,   Chronic with kidney stones    Benny LennertJoseph L Jujhar Everett, MD 01/22/14 1007

## 2014-01-22 NOTE — Discharge Instructions (Signed)
Follow up with your urologist as planned this week.

## 2014-01-22 NOTE — ED Notes (Signed)
Pt reports L flank pain with n/v/d. Onset this am. Was seen and treated here for nephrolithiasis last week.

## 2014-01-22 NOTE — ED Notes (Signed)
Ultrasound Technologist at bedside.

## 2014-02-15 ENCOUNTER — Emergency Department (HOSPITAL_COMMUNITY): Payer: Self-pay

## 2014-02-15 ENCOUNTER — Encounter (HOSPITAL_COMMUNITY): Payer: Self-pay | Admitting: *Deleted

## 2014-02-15 ENCOUNTER — Emergency Department (HOSPITAL_COMMUNITY)
Admission: EM | Admit: 2014-02-15 | Discharge: 2014-02-15 | Disposition: A | Discharge status: Home / Self Care | Payer: Self-pay | Attending: Emergency Medicine | Admitting: Emergency Medicine

## 2014-02-15 DIAGNOSIS — R1032 Left lower quadrant pain: Secondary | ICD-10-CM | POA: Insufficient documentation

## 2014-02-15 DIAGNOSIS — Z79899 Other long term (current) drug therapy: Secondary | ICD-10-CM | POA: Insufficient documentation

## 2014-02-15 DIAGNOSIS — G8929 Other chronic pain: Secondary | ICD-10-CM | POA: Insufficient documentation

## 2014-02-15 DIAGNOSIS — Z8709 Personal history of other diseases of the respiratory system: Secondary | ICD-10-CM | POA: Insufficient documentation

## 2014-02-15 DIAGNOSIS — Z720 Tobacco use: Secondary | ICD-10-CM | POA: Insufficient documentation

## 2014-02-15 DIAGNOSIS — Z87442 Personal history of urinary calculi: Secondary | ICD-10-CM | POA: Insufficient documentation

## 2014-02-15 DIAGNOSIS — Z7952 Long term (current) use of systemic steroids: Secondary | ICD-10-CM | POA: Insufficient documentation

## 2014-02-15 DIAGNOSIS — Z791 Long term (current) use of non-steroidal anti-inflammatories (NSAID): Secondary | ICD-10-CM | POA: Insufficient documentation

## 2014-02-15 LAB — URINALYSIS, ROUTINE W REFLEX MICROSCOPIC
BILIRUBIN URINE: NEGATIVE
GLUCOSE, UA: NEGATIVE mg/dL
KETONES UR: NEGATIVE mg/dL
Leukocytes, UA: NEGATIVE
NITRITE: NEGATIVE
PH: 5.5 (ref 5.0–8.0)
Protein, ur: NEGATIVE mg/dL
Specific Gravity, Urine: >1.03 — ABNORMAL HIGH (ref 1.005–1.030)
UROBILINOGEN UA: 0.2 mg/dL (ref 0.0–1.0)

## 2014-02-15 LAB — URINE MICROSCOPIC-ADD ON

## 2014-02-15 MED ORDER — METOCLOPRAMIDE HCL 5 MG/ML IJ SOLN
10.0000 mg | Freq: Once | INTRAMUSCULAR | Status: AC
  Administered 2014-02-15: 10 mg via INTRAVENOUS
  Filled 2014-02-15: qty 2

## 2014-02-15 MED ORDER — MORPHINE SULFATE 4 MG/ML IJ SOLN
4.0000 mg | Freq: Once | INTRAMUSCULAR | Status: AC
  Administered 2014-02-15: 4 mg via INTRAVENOUS
  Filled 2014-02-15: qty 1

## 2014-02-15 MED ORDER — IOHEXOL 300 MG/ML  SOLN
100.0000 mL | Freq: Once | INTRAMUSCULAR | Status: AC | PRN
  Administered 2014-02-15: 100 mL via INTRAVENOUS

## 2014-02-15 MED ORDER — IOHEXOL 300 MG/ML  SOLN: 50.0000 mL | Freq: Once | INTRAMUSCULAR | Status: DC | PRN

## 2014-02-15 NOTE — ED Notes (Signed)
Discharge instructions reviewed with pt, questions answered. Pt verbalized understanding.  

## 2014-02-15 NOTE — Discharge Instructions (Signed)
Your chemistries and CT scan are negative for any acute event. Please increase fluids. Use Tylenol or ibuprofen for soreness. Please return if any bleeding, high fever, or changes in her general condition. Abdominal Pain Many things can cause belly (abdominal) pain. Most times, the belly pain is not dangerous. Many cases of belly pain can be watched and treated at home. HOME CARE   Do not take medicines that help you go poop (laxatives) unless told to by your doctor.  Only take medicine as told by your doctor.  Eat or drink as told by your doctor. Your doctor will tell you if you should be on a special diet. GET HELP IF:  You do not know what is causing your belly pain.  You have belly pain while you are sick to your stomach (nauseous) or have runny poop (diarrhea).  You have pain while you pee or poop.  Your belly pain wakes you up at night.  You have belly pain that gets worse or better when you eat.  You have belly pain that gets worse when you eat fatty foods.  You have a fever. GET HELP RIGHT AWAY IF:   The pain does not go away within 2 hours.  You keep throwing up (vomiting).  The pain changes and is only in the right or left part of the belly.  You have bloody or tarry looking poop. MAKE SURE YOU:   Understand these instructions.  Will watch your condition.  Will get help right away if you are not doing well or get worse. Document Released: 07/15/2007 Document Revised: 01/31/2013 Document Reviewed: 10/05/2012 [facility] Patient Information 2015 [place], [place]. This information is not intended to replace advice given to you by your health care provider. Make sure you discuss any questions you have with your health care provider.

## 2014-02-15 NOTE — Care Management Note (Signed)
ED/CM noted patient did not have health insurance and/or PCP listed in the computer.  Patient was given the [place] resource handout with information on the clinics, food pantries, and the handout for new health insurance sign-up. Pt was also given a Rx discount card. Patient expressed appreciation for information received. 

## 2014-02-15 NOTE — ED Notes (Signed)
Patient states he woke up this am with LLQ sharp pain and vomiting x 4, reports he has a history of kidney stones but denies back pain

## 2014-02-15 NOTE — ED Provider Notes (Signed)
CSN: [id number]     Arrival date & time 02/15/14  0818 History   First MD Initiated Contact with Patient 02/15/14 0827     Chief Complaint  Patient presents with  . Abdominal Pain     (Consider location/radiation/quality/duration/timing/severity/associated sxs/prior Treatment) HPI Comments: Pt reports diarrhea stool this AM. Cold symptoms last week. No reported fever. Hx of kidney stones, but not back pain during this episode.  Patient is a 29 y.o. male presenting with abdominal pain. The history is provided by the patient.  Abdominal Pain Pain location:  LLQ Pain quality: sharp   Pain severity:  Moderate Onset quality:  Gradual Duration:  4 hours Timing:  Intermittent Progression:  Unchanged Chronicity:  Recurrent Context: alcohol use   Context: not diet changes, not previous surgeries, not recent illness, not recent travel, not suspicious food intake and not trauma   Relieved by:  Nothing Worsened by:  Nothing tried Ineffective treatments:  None tried Associated symptoms: no chest pain, no cough, no dysuria, no hematuria and no shortness of breath     Past Medical History  Diagnosis Date  . Strep throat   . Kidney stone   . Chronic flank pain    History reviewed. No pertinent past surgical history. Family History  Problem Relation Age of Onset  . Diabetes Father    History  Substance Use Topics  . Smoking status: Current Every Day Smoker -- 1.00 packs/day    Types: Cigarettes  . Smokeless tobacco: Not on file  . Alcohol Use: No    Review of Systems  Constitutional: Negative for activity change.       All ROS Neg except as noted in HPI  HENT: Negative for nosebleeds.   Eyes: Negative for photophobia and discharge.  Respiratory: Negative for cough, shortness of breath and wheezing.   Cardiovascular: Negative for chest pain and palpitations.  Gastrointestinal: Positive for abdominal pain. Negative for blood in stool.  Genitourinary: Positive for flank pain.  Negative for dysuria, frequency and hematuria.  Musculoskeletal: Negative for back pain, arthralgias and neck pain.  Skin: Negative.   Neurological: Negative for dizziness, seizures and speech difficulty.  Psychiatric/Behavioral: Negative for hallucinations and confusion.      Allergies  Review of patient's allergies indicates no known allergies.  Home Medications   Prior to Admission medications   Medication Sig Start Date End Date Taking? Authorizing Provider  Aspirin-Salicylamide-Caffeine (BC HEADACHE POWDER PO) Take 2 packets by mouth daily as needed (pain).    Historical Provider, MD  cyclobenzaprine (FLEXERIL) 5 MG tablet Take 1 tablet (5 mg total) by mouth 3 (three) times daily as needed for muscle spasms. Patient not taking: Reported on 01/16/2014 12/04/13   Layla Maw Ward, DO  dexamethasone (DECADRON) 4 MG tablet 1 po bid with food Patient not taking: Reported on 01/16/2014 12/05/13   Kathie Dike, PA-C  diclofenac (VOLTAREN) 75 MG EC tablet Take 1 tablet (75 mg total) by mouth 2 (two) times daily. Patient not taking: Reported on 01/16/2014 12/05/13   Kathie Dike, PA-C  HYDROcodone-acetaminophen (NORCO/VICODIN) 5-325 MG per tablet Take 1 tablet by mouth every 6 (six) hours as needed for moderate pain. Patient not taking: Reported on 01/22/2014 01/16/14   Gerhard Munch, MD  ibuprofen (ADVIL,MOTRIN) 600 MG tablet Take 1 tablet (600 mg total) by mouth every 6 (six) hours as needed. Patient not taking: Reported on 01/16/2014 12/08/13   Loren Racer, MD  methocarbamol (ROBAXIN) 500 MG tablet Take 2 tablets (1,000 mg total)  by mouth every 8 (eight) hours as needed for muscle spasms. Patient not taking: Reported on 01/16/2014 12/08/13   Loren Racer, MD  naproxen (NAPROSYN) 250 MG tablet Take 1 tablet (250 mg total) by mouth 2 (two) times daily as needed for mild pain or moderate pain (take with food). Patient not taking: Reported on 01/16/2014 12/28/13   Samuel Jester, DO   ondansetron (ZOFRAN) 4 MG tablet Take 1 tablet (4 mg total) by mouth every 8 (eight) hours as needed for nausea or vomiting. Patient not taking: Reported on 01/16/2014 12/28/13   Samuel Jester, DO  oxyCODONE-acetaminophen (PERCOCET) 5-325 MG per tablet Take 2 tablets by mouth every 4 (four) hours as needed. 01/18/14   Donnetta Hutching, MD  promethazine (PHENERGAN) 25 MG tablet Take 1 tablet (25 mg total) by mouth every 6 (six) hours as needed. 01/18/14   Donnetta Hutching, MD  tamsulosin (FLOMAX) 0.4 MG CAPS capsule Take 1 capsule (0.4 mg total) by mouth daily. 01/16/14   Gerhard Munch, MD   BP 145/90 mmHg  Pulse 100  Temp(Src) 97.8 F (36.6 C) (Oral)  Resp 20  Ht  (1.803 m)  Wt 205 lb (92.987 kg)  BMI 28.60 kg/m2  SpO2 97% Physical Exam  Constitutional: He is oriented to person, place, and time. He appears well-developed and well-nourished.  Non-toxic appearance.  HENT:  Head: Normocephalic.  Right Ear: Tympanic membrane and external ear normal.  Left Ear: Tympanic membrane and external ear normal.  Eyes: EOM and lids are normal. Pupils are equal, round, and reactive to light.  Neck: Normal range of motion. Neck supple. Carotid bruit is not present.  Cardiovascular: Normal rate, regular rhythm, normal heart sounds, intact distal pulses and normal pulses.   Pulmonary/Chest: Breath sounds normal. No respiratory distress.  Abdominal: Soft. Bowel sounds are normal. He exhibits no distension, no fluid wave, no ascites and no mass. There is no hepatosplenomegaly. There is tenderness in the left lower quadrant. There is no rigidity, no guarding and no CVA tenderness.  Mild to mod left lower quad abd pain.  Genitourinary: Rectum normal. Rectal exam shows no external hemorrhoid, no fissure, no mass and anal tone normal. Guaiac negative stool. Prostate is enlarged. Prostate is not tender.  Musculoskeletal: Normal range of motion.  Lymphadenopathy:       Head (right side): No submandibular  adenopathy present.       Head (left side): No submandibular adenopathy present.    He has no cervical adenopathy.  Neurological: He is alert and oriented to person, place, and time. He has normal strength. No cranial nerve deficit or sensory deficit.  Skin: Skin is warm and dry.  Psychiatric: He has a normal mood and affect. His speech is normal.  Nursing note and vitals reviewed.   ED Course  Procedures (including critical care time) Labs Review Labs Reviewed  URINALYSIS, ROUTINE W REFLEX MICROSCOPIC    Imaging Review No results found.   EKG Interpretation None      MDM  Vital signs are nonacute. Pulse oximetry ranges 97-100%, within normal limits by my interpretation.  Urinalysis reveals a specific gravity greater than 1.030, with a trace of hemoglobin, white blood cells of 0-2, red cells 0-2 doubt urinary tract infection or kidney stone. CT scan of the abdomen and pelvis is negative for small bowel obstruction, the urinary bladder is unremarkable. There is no acute inflammatory process in the abdomen or pelvis. There is no obstructive stones in the ureters, and no hydroureter noted.  Suspect left lower quadrant abdominal pain possibly related to viral illness, as the patient and viral symptoms approximately 1 week ago. The patient is advised to return to the emergency department to see the primary care physician immediately if any changes, problems, or concerns. He is instructed to increase fluids, use Tylenol or ibuprofen for discomfort.    Final diagnoses:  LLQ abdominal pain    **I have reviewed nursing notes, vital signs, and all appropriate lab and imaging results for this patient.Kathie Dike*    Chadley Dziedzic M Mardy Hoppe, PA-C 02/16/14 1218  Layla MawKristen N Ward, DO 02/19/14 [phone]

## 2014-02-26 ENCOUNTER — Emergency Department (HOSPITAL_COMMUNITY)
Admission: EM | Admit: 2014-02-26 | Discharge: 2014-02-26 | Disposition: A | Discharge status: Home / Self Care | Payer: Self-pay | Attending: Emergency Medicine | Admitting: Emergency Medicine

## 2014-02-26 ENCOUNTER — Encounter (HOSPITAL_COMMUNITY): Payer: Self-pay | Admitting: *Deleted

## 2014-02-26 DIAGNOSIS — Z8709 Personal history of other diseases of the respiratory system: Secondary | ICD-10-CM | POA: Insufficient documentation

## 2014-02-26 DIAGNOSIS — X58XXXA Exposure to other specified factors, initial encounter: Secondary | ICD-10-CM | POA: Insufficient documentation

## 2014-02-26 DIAGNOSIS — Z87442 Personal history of urinary calculi: Secondary | ICD-10-CM | POA: Insufficient documentation

## 2014-02-26 DIAGNOSIS — Z79899 Other long term (current) drug therapy: Secondary | ICD-10-CM | POA: Insufficient documentation

## 2014-02-26 DIAGNOSIS — S39012A Strain of muscle, fascia and tendon of lower back, initial encounter: Secondary | ICD-10-CM | POA: Insufficient documentation

## 2014-02-26 DIAGNOSIS — Z720 Tobacco use: Secondary | ICD-10-CM | POA: Insufficient documentation

## 2014-02-26 DIAGNOSIS — Y998 Other external cause status: Secondary | ICD-10-CM | POA: Insufficient documentation

## 2014-02-26 DIAGNOSIS — Y9289 Other specified places as the place of occurrence of the external cause: Secondary | ICD-10-CM | POA: Insufficient documentation

## 2014-02-26 DIAGNOSIS — T148XXA Other injury of unspecified body region, initial encounter: Secondary | ICD-10-CM

## 2014-02-26 DIAGNOSIS — M545 Low back pain, unspecified: Secondary | ICD-10-CM

## 2014-02-26 DIAGNOSIS — Y9389 Activity, other specified: Secondary | ICD-10-CM | POA: Insufficient documentation

## 2014-02-26 DIAGNOSIS — G8929 Other chronic pain: Secondary | ICD-10-CM | POA: Insufficient documentation

## 2014-02-26 MED ORDER — HYDROCODONE-ACETAMINOPHEN 5-325 MG PO TABS: ORAL_TABLET | ORAL | Status: DC

## 2014-02-26 MED ORDER — METHOCARBAMOL 500 MG PO TABS: 1000.0000 mg | ORAL_TABLET | Freq: Four times a day (QID) | ORAL | Status: DC | PRN

## 2014-02-26 MED ORDER — NAPROXEN 250 MG PO TABS: 250.0000 mg | ORAL_TABLET | Freq: Two times a day (BID) | ORAL | Status: DC | PRN

## 2014-02-26 NOTE — Discharge Instructions (Signed)
°Emergency Department Resource Guide °1) Find a Doctor and Pay Out of Pocket °Although you won't have to find out who is covered by your insurance plan, it is a good idea to ask around and get recommendations. You will then need to call the office and see if the doctor you have chosen will accept you as a new patient and what types of options they offer for patients who are self-pay. Some doctors offer discounts or will set up payment plans for their patients who do not have insurance, but you will need to ask so you aren't surprised when you get to your appointment. ° °2) Contact Your Local Health Department °Not all health departments have doctors that can see patients for sick visits, but many do, so it is worth a call to see if yours does. If you don't know where your local health department is, you can check in your phone book. The CDC also has a tool to help you locate your state's health department, and many state websites also have listings of all of their local health departments. ° °3) Find a Walk-in Clinic °If your illness is not likely to be very severe or complicated, you may want to try a walk in clinic. These are popping up all over the country in pharmacies, drugstores, and shopping centers. They're usually staffed by nurse practitioners or physician assistants that have been trained to treat common illnesses and complaints. They're usually fairly quick and inexpensive. However, if you have serious medical issues or chronic medical problems, these are probably not your best option. ° °No Primary Care Doctor: °- Call [facility] at  832-8000 - they can help you locate a primary care doctor that  accepts your insurance, provides certain services, etc. °- Physician Referral Service- 1-800-533-3463 ° °Chronic Pain Problems: °Organization         Address  Phone   Notes  °[facility]  (336) 297-2271 Patients need to be referred by their primary care doctor.  ° °Medication  Assistance: °Organization         Address  Phone   Notes  °[place] [facility] [address] °[place], [place] [address] (336) 641-8030 --Must be a resident of [place] °-- Must have NO insurance coverage whatsoever (no Medicaid/ Medicare, etc.) °-- The pt. MUST have a primary care doctor that directs their care regularly and follows them in the community °  °[facility]  (866) 331-1348   °[employer]  (888) 892-1162   ° °Agencies that provide inexpensive medical care: °Organization         Address  Phone   Notes  °[facility]  (336) 832-8035   °[facility]    (336) 832-7272   °[facility] [address] °[place], [place] [address] (336) 832-4777   °[facility] [address], °[place] (336) 271-4999   °[facility]    (336) 373-0678   °[facility]    (336) 272-1050   °[facility] ° [address], [place] Phone:  (336) 832-4444, Fax:  (336) 832-4440 Hours of Operation:  9 am - 6 pm, M-F.  Also accepts Medicaid/Medicare and self-pay.  °[facility] ° [address], [place] Phone: (336) 832-3150, Fax: (336) [phone]. Hours of Operation:  8:30 am - 5:30 pm, M-F.  Also accepts Medicaid and self-pay.  °[facility] [place] [address], [place] Phone: (336) 878-6027   °[facility] [address], [place], [place] (336)723-1848, Ext. 123 Mondays & Thursdays: 7-9 AM.  First 15 patients are seen on a first come, first serve basis. °  ° °Medicaid-accepting [place] Providers: ° °Organization         Address  Phone   Notes  °[facility] [address], [place] (336) 641-2100 Also accepts self-pay patients.  °[facility] [address], [place] ° (336) 856-9996   °[facility] [address], [place]  (336) 288-8857   °[facility] [address] [place] [address], [place] (336) 299-7000   °Veita Bland [address], [place]  ° (336) 373-1557 Only accepts [place] Access Medicaid patients after they have their name applied to their card.  ° °Self-Pay (no insurance) in [place]: ° °Organization         Address  Phone   Notes  °Sickle Cell Patients, [facility] [address], [place] (336) 832-1970   °[facility] [address], [place] (336) 832-4400   °[facility] ° [address], [place] (336) 992-4800   °[facility]/Dr. Osei-Bonsu ° [address] [place] [address], [place] or [address], [place] (336) 841-8500 Phone number for both [place] and [place] locations is the same.  °[facility] [address], [place] (336) 299-0000   °[facility] [address] [place] [address], [place] or [address] (336) 852-7530 °(336) 878-2260   °[facility] [address],  (336) 350-1642, phone; (336) 294-5005, fax Sees patients 1st and 3rd Saturday of every month.  Must not qualify for public or private insurance (i.e. Medicaid, Medicare, [place] Health Choice, Veterans' Benefits) • Household income should be no more than 200% of the poverty level •The clinic cannot treat you if you are pregnant or think you are pregnant • Sexually transmitted diseases are not treated at the clinic.  ° ° °Dental Care: °Organization         Address  Phone  Notes  °[place] [facility] [address],  (336) 641-6152 Accepts children up to age 21 who are enrolled in Medicaid or [place] Health Choice; pregnant women with a Medicaid card; and children who have applied for Medicaid or [place] Health Choice, but were declined, whose parents can pay a reduced fee at time of service.  °[place]  [facility] [place]  [address], [place] (336) 641-7733 Accepts children up to age 21 who are enrolled in Medicaid or [place] Health Choice; pregnant women with a Medicaid card; and children who have applied for Medicaid or [place] Health Choice, but were declined, whose parents can pay a reduced fee at time of service.  °[facility] ° [address],  (336) 641-4533 Patients are seen by appointment only. Walk-ins are not accepted. [facility] will see patients 18 years of age and older. °Monday - Tuesday (8am-5pm) °Most Wednesdays (8:30-5pm) °$30 per visit, cash only  °[facility] ° [address], [place] (336) 641-4533 Patients are seen by appointment only. Walk-ins are not accepted. [facility] will see patients 18 years of age and older. °One   Wednesday Evening (Monthly: Volunteer Based).  $30 per visit, cash only  °[facility]  (919) 537-3737 for adults; Children under age 4, call Graduate Pediatric Dentistry at (919) [phone]. Children aged 4-14, please call (919) [phone] to request a pediatric application. ° Dental services are provided in all areas of dental care including fillings, crowns and bridges, complete and partial dentures, implants, gum treatment, root canals, and extractions. Preventive care is also provided. Treatment is provided to both adults and children. °Patients are selected via a lottery and there is often a waiting list. °  °[facility] [address], °[place] ° (336) 763-8833 [email or web address] °  °[facility] [address], [place], [place] (336)723-1848, Ext. 123 Second and Fourth Thursday of each month, opens at 6:30 AM; Clinic ends at 9 AM.  Patients are seen on a first-come first-served basis, and a limited number are seen during each clinic.  ° °[facility] ° [address], [place], [place] (336) 723-7904    Eligibility Requirements °You must have lived in [place], [place], or [place] counties for at least the last three months. °  You cannot be eligible for state or federal sponsored healthcare insurance, including Veterans Administration, Medicaid, or Medicare. °  You generally cannot be eligible for healthcare insurance through your employer.  °  How to apply: °Eligibility screenings are held every Tuesday and Wednesday afternoon from 1:00 pm until 4:00 pm. You do not need an appointment for the interview!  °[facility] [address], [place], [place] 336-631-2330   °[facility]  336-342-8273   °[facility]  336-703-3100   °[facility]  336-570-6415   ° °Behavioral Health Resources in the Community: °Intensive Outpatient Programs °Organization         Address  Phone  Notes  °[facility] [address], [place], [place] 336-878-6098   °[facility] [address], [place], [place] 336-832-9800   °[facility] [address], [place], [place] ° 336-882-2125   °[facility] [address],  °[place], [place] 1-800-853-5163 or 336-641-4981   °Substance Abuse Resources °Organization         Address  Phone  Notes  °[facility]  336-882-2125   °[facility]  336-784-9470   °[facility]  336-285-9073   °[facility]  336-845-3988   °Residential & Outpatient Substance Abuse Program  1-800-659-3381   °Psychological Services °Organization         Address  Phone  Notes  °[facility]  336- 832-9600   °[facility]  336- 378-7881   °[facility] [address], [place] 1-800-853-5163 or 336-641-4981   ° °Mobile Crisis Teams °Organization         Address  Phone  Notes  °[facility], Mobile Crisis Care Unit  1-877-626-1772   °Assertive °[facility] ° [address]  [place], [place] 336-834-9664   °Sharon DeEsch [address] °[place] 336-554-5454   ° °Self-Help/Support Groups °Organization         Address  Phone             Notes  °[facility] - variety of support groups  336- 373-1402 Call for more information  °Narcotics Anonymous (NA), [facility] [address], °[place]   2 meetings at this location  ° °  Residential Treatment Programs °Organization         Address  Phone  Notes  °[facility] [address],    °[place]  1-866-801-8205   °[facility] ° [address], [place], [place] 704-293-8524   °[facility] [address], [place] 336-845-3988 Admissions: 8am-3pm M-F  °[facility] [address]    °[place], [place] 336-841-1104   °[facility] [address], [place], [place] 336-379-7146   °[facility] [address]  °[place], [place] 336-285-9073   °[facility] - Intensive Outpatient [address], [place], [place] 336-852-3033   °[facility] [address]  °[place], [place] 1-877-615-2722 or 336-784-9470   °[facility] [address] [place], [place] 336-227-7417 Accepts Medicaid  °[facility] [address]  °[place] 1-800-659-3381 Substance Abuse/Addiction Treatment  ° °[place] Behavioral Health Resources °Organization         Address  Phone  Notes  °[facility]  (888) 581-9988   °Julie Brannon, PhD [address] [place], [place]   (336) 349-5553 or (336) 951-0000   °[facility]   [address] °[place], [place] (336) 349-4454   °[facility] [address], [place], [place] (336) 342-8316 Insurance/Medicaid/sponsorship through [facility]  °[facility] [address]                                    [place], [place] (336) 342-8316 Therapy/tele-psych/case    °[facility] [address]  ° [place], [place] (336) 349-2233    °Dr. Arfeen  (336) 349-4544   °[facility] [place]  [employer] [place] [facility] 1) [address], [place] °2) [address], [place] °3)  [address], [place] (336) 349-3220 °(336) 342-7768 ° °(336) 342-8140   °[place] [facility] (336) 342-1394 or (336) 342-3537 (After Hours)    ° ° ° °Take the prescriptions as directed.  Apply moist heat or ice to the area(s) of discomfort, for 15 minutes at a time, several times per day for the next few days.  Do not fall asleep on a heating or ice pack.  Call your regular medical doctor today to schedule a follow up appointment this week.  Return to the Emergency Department immediately if worsening. ° °

## 2014-02-26 NOTE — ED Notes (Signed)
Pt states he was moving furniture on Saturday and now c/o lower back pain.

## 2014-02-26 NOTE — ED Provider Notes (Signed)
CSN: [id number]     Arrival date & time 02/26/14  0605 History   First MD Initiated Contact with Patient 02/26/14 [phone]     Chief Complaint  Patient presents with  . Back Pain      HPI Pt was seen at 0615. Per pt, c/o gradual onset and persistence of constant left sided low back "pain" for the past 2 days. Describes the pain as "my muscles are tight." Pain worsens with palpation of the area and body position changes. Pt states the pain began the day after he had helped a friend move furniture 2 days ago. Denies incont/retention of bowel or bladder, no saddle anesthesia, no focal motor weakness, no tingling/numbness in extremities, no fevers, no direct injury, no abd pain.   The symptoms have been associated with no other complaints.    Past Medical History  Diagnosis Date  . Strep throat   . Kidney stone   . Chronic flank pain    History reviewed. No pertinent past surgical history.   Family History  Problem Relation Age of Onset  . Diabetes Father    History  Substance Use Topics  . Smoking status: Current Every Day Smoker -- 1.00 packs/day    Types: Cigarettes  . Smokeless tobacco: Not on file  . Alcohol Use: No    Review of Systems ROS: Statement: All systems negative except as marked or noted in the HPI; Constitutional: Negative for fever and chills. ; ; Eyes: Negative for eye pain, redness and discharge. ; ; ENMT: Negative for ear pain, hoarseness, nasal congestion, sinus pressure and sore throat. ; ; Cardiovascular: Negative for chest pain, palpitations, diaphoresis, dyspnea and peripheral edema. ; ; Respiratory: Negative for cough, wheezing and stridor. ; ; Gastrointestinal: Negative for nausea, vomiting, diarrhea, abdominal pain, blood in stool, hematemesis, jaundice and rectal bleeding. . ; ; Genitourinary: Negative for dysuria, flank pain and hematuria. ; ; Musculoskeletal: +LBP. Negative for neck pain. Negative for swelling and trauma.; ; Skin: Negative for pruritus,  rash, abrasions, blisters, bruising and skin lesion.; ; Neuro: Negative for headache, lightheadedness and neck stiffness. Negative for weakness, altered level of consciousness , altered mental status, extremity weakness, paresthesias, involuntary movement, seizure and syncope.        Allergies  Review of patient's allergies indicates no known allergies.  Home Medications   Prior to Admission medications   Medication Sig Start Date End Date Taking? Authorizing Provider  Aspirin-Salicylamide-Caffeine (BC HEADACHE POWDER PO) Take 2 packets by mouth daily as needed (pain).    Historical Provider, MD  cyclobenzaprine (FLEXERIL) 5 MG tablet Take 1 tablet (5 mg total) by mouth 3 (three) times daily as needed for muscle spasms. Patient not taking: Reported on 01/16/2014 12/04/13   Layla Maw Ward, DO  dexamethasone (DECADRON) 4 MG tablet 1 po bid with food Patient not taking: Reported on 01/16/2014 12/05/13   Kathie Dike, PA-C  diclofenac (VOLTAREN) 75 MG EC tablet Take 1 tablet (75 mg total) by mouth 2 (two) times daily. Patient not taking: Reported on 01/16/2014 12/05/13   Kathie Dike, PA-C  HYDROcodone-acetaminophen (NORCO/VICODIN) 5-325 MG per tablet 1 or 2 tabs PO q6 hours prn pain 02/26/14   Samuel Jester, DO  ibuprofen (ADVIL,MOTRIN) 600 MG tablet Take 1 tablet (600 mg total) by mouth every 6 (six) hours as needed. Patient not taking: Reported on 01/16/2014 12/08/13   Loren Racer, MD  methocarbamol (ROBAXIN) 500 MG tablet Take 2 tablets (1,000 mg total) by mouth 4 (four)  times daily as needed for muscle spasms (muscle spasm/pain). 02/26/14   Samuel JesterKathleen Tvisha Schwoerer, DO  naproxen (NAPROSYN) 250 MG tablet Take 1 tablet (250 mg total) by mouth 2 (two) times daily as needed for mild pain or moderate pain (take with food). 02/26/14   Samuel JesterKathleen [place] Arave, DO  ondansetron (ZOFRAN) 4 MG tablet Take 1 tablet (4 mg total) by mouth every 8 (eight) hours as needed for nausea or vomiting. Patient not  taking: Reported on 01/16/2014 12/28/13   Samuel JesterKathleen Rosela Supak, DO  oxyCODONE-acetaminophen (PERCOCET) 5-325 MG per tablet Take 2 tablets by mouth every 4 (four) hours as needed. Patient not taking: Reported on 02/15/2014 01/18/14   Donnetta HutchingBrian Cook, MD  promethazine (PHENERGAN) 25 MG tablet Take 1 tablet (25 mg total) by mouth every 6 (six) hours as needed. Patient not taking: Reported on 02/15/2014 01/18/14   Donnetta HutchingBrian Cook, MD  tamsulosin (FLOMAX) 0.4 MG CAPS capsule Take 1 capsule (0.4 mg total) by mouth daily. Patient not taking: Reported on 02/15/2014 01/16/14   Gerhard Munchobert Lockwood, MD   BP 122/78 mmHg  Pulse 87  Temp(Src) 97.4 F (36.3 C) (Oral)  Resp 18  Ht 5\' 11"  (1.803 m)  Wt 205 lb (92.987 kg)  BMI 28.60 kg/m2  SpO2 96% Physical Exam  0620: Physical examination:  Nursing notes reviewed; Vital signs and O2 SAT reviewed;  Constitutional: Well developed, Well nourished, Well hydrated, In no acute distress; Head:  Normocephalic, atraumatic; Eyes: EOMI, PERRL, No scleral icterus; ENMT: Mouth and pharynx normal, Mucous membranes moist; Neck: Supple, Full range of motion, No lymphadenopathy; Cardiovascular: Regular rate and rhythm, No murmur, rub, or gallop; Respiratory: Breath sounds clear & equal bilaterally, No rales, rhonchi, wheezes.  Speaking full sentences with ease, Normal respiratory effort/excursion; Chest: Nontender, Movement normal; Abdomen: Soft, Nontender, Nondistended, Normal bowel sounds; Genitourinary: No CVA tenderness;  Spine:  No midline CS, TS, LS tenderness. +TTP left lumbar paraspinal muscles. No rash.;; Extremities: Pulses normal, No tenderness, No edema, No calf edema or asymmetry.; Neuro: AA&Ox3, Major CN grossly intact.  Speech clear. No gross focal motor or sensory deficits in extremities. Strength 5/5 equal bilat UE's and LE's, including great toe dorsiflexion.  DTR 2/4 equal bilat UE's and LE's.  No gross sensory deficits.  Neg straight leg raises bilat. Climbs on and off stretcher easily  by himself. Gait steady.; Skin: Color normal, Warm, Dry.   ED Course  Procedures     EKG Interpretation None      MDM  MDM Reviewed: previous chart, nursing note and vitals Reviewed previous: CT scan     0630:  LBP after moving furniture. No red flags on H&P. Tx symptomatically at this time. Dx d/w pt.  Questions answered.  Verb understanding, agreeable to d/c home with outpt f/u.   Samuel JesterKathleen Ansley [place], DO 03/01/14 [phone]

## 2014-03-12 ENCOUNTER — Encounter (HOSPITAL_COMMUNITY): Payer: Self-pay | Admitting: Cardiology

## 2014-03-12 ENCOUNTER — Emergency Department (HOSPITAL_COMMUNITY)
Admission: EM | Admit: 2014-03-12 | Discharge: 2014-03-12 | Disposition: A | Discharge status: Home / Self Care | Payer: Self-pay | Attending: Emergency Medicine | Admitting: Emergency Medicine

## 2014-03-12 DIAGNOSIS — Z720 Tobacco use: Secondary | ICD-10-CM | POA: Insufficient documentation

## 2014-03-12 DIAGNOSIS — G8929 Other chronic pain: Secondary | ICD-10-CM | POA: Insufficient documentation

## 2014-03-12 DIAGNOSIS — Z79899 Other long term (current) drug therapy: Secondary | ICD-10-CM | POA: Insufficient documentation

## 2014-03-12 DIAGNOSIS — N200 Calculus of kidney: Secondary | ICD-10-CM | POA: Insufficient documentation

## 2014-03-12 DIAGNOSIS — Z8709 Personal history of other diseases of the respiratory system: Secondary | ICD-10-CM | POA: Insufficient documentation

## 2014-03-12 DIAGNOSIS — Z791 Long term (current) use of non-steroidal anti-inflammatories (NSAID): Secondary | ICD-10-CM | POA: Insufficient documentation

## 2014-03-12 LAB — URINALYSIS, ROUTINE W REFLEX MICROSCOPIC
BILIRUBIN URINE: NEGATIVE
GLUCOSE, UA: NEGATIVE mg/dL
Hgb urine dipstick: NEGATIVE
Ketones, ur: NEGATIVE mg/dL
Leukocytes, UA: NEGATIVE
Nitrite: NEGATIVE
PH: 6 (ref 5.0–8.0)
PROTEIN: NEGATIVE mg/dL
Urobilinogen, UA: 0.2 mg/dL (ref 0.0–1.0)

## 2014-03-12 MED ORDER — IBUPROFEN 800 MG PO TABS: 800.0000 mg | ORAL_TABLET | Freq: Three times a day (TID) | ORAL | Status: DC

## 2014-03-12 MED ORDER — PROMETHAZINE HCL 25 MG PO TABS: 25.0000 mg | ORAL_TABLET | Freq: Four times a day (QID) | ORAL | Status: DC | PRN

## 2014-03-12 MED ORDER — TAMSULOSIN HCL 0.4 MG PO CAPS: 0.4000 mg | ORAL_CAPSULE | Freq: Two times a day (BID) | ORAL | Status: DC

## 2014-03-12 MED ORDER — OXYCODONE-ACETAMINOPHEN 5-325 MG PO TABS: 1.0000 | ORAL_TABLET | ORAL | Status: DC | PRN

## 2014-03-12 MED ORDER — KETOROLAC TROMETHAMINE 60 MG/2ML IM SOLN
60.0000 mg | Freq: Once | INTRAMUSCULAR | Status: AC
  Administered 2014-03-12: 60 mg via INTRAMUSCULAR
  Filled 2014-03-12: qty 2

## 2014-03-12 NOTE — ED Provider Notes (Signed)
CSN: [id number]     Arrival date & time 03/12/14  7829 History  This chart was scribed for Dale Roller, MD by Ronney Lion, ED Scribe. This patient was seen in room APA03/APA03 and the patient's care was started at 8:00 AM.    Chief Complaint  Patient presents with  . Flank Pain   The history is provided by the patient. No language interpreter was used.     HPI Comments: ZAEVION PARKE is a 29 y.o. male with a history of kidney stones who presents to the Emergency Department complaining of acute onset, intermittent sharp, stabbing left side pain radiating to his LLQ that began this morning when patient woke up. He notes associated nausea that has gradually improved, and some vomiting. These symptoms are similar to prior kidney stones. He denies hematuria.  Had CT 3 weeks ago showing several < 3mm stones in the L kidney.  Endorses dark colored urine.   Past Medical History  Diagnosis Date  . Strep throat   . Kidney stone   . Chronic flank pain    History reviewed. No pertinent past surgical history. Family History  Problem Relation Age of Onset  . Diabetes Father    History  Substance Use Topics  . Smoking status: Current Every Day Smoker -- 1.00 packs/day    Types: Cigarettes  . Smokeless tobacco: Not on file  . Alcohol Use: No    Review of Systems  Gastrointestinal: Positive for nausea, vomiting and abdominal pain.  Genitourinary: Positive for flank pain.  All other systems reviewed and are negative.     Allergies  Review of patient's allergies indicates no known allergies.  Home Medications   Prior to Admission medications   Medication Sig Start Date End Date Taking? Authorizing Provider  Aspirin-Salicylamide-Caffeine (BC HEADACHE POWDER PO) Take 2 packets by mouth daily as needed (pain).    Historical Provider, MD  cyclobenzaprine (FLEXERIL) 5 MG tablet Take 1 tablet (5 mg total) by mouth 3 (three) times daily as needed for muscle spasms. Patient not taking:  Reported on 01/16/2014 12/04/13   Layla Maw Ward, DO  dexamethasone (DECADRON) 4 MG tablet 1 po bid with food Patient not taking: Reported on 01/16/2014 12/05/13   Kathie Dike, PA-C  diclofenac (VOLTAREN) 75 MG EC tablet Take 1 tablet (75 mg total) by mouth 2 (two) times daily. Patient not taking: Reported on 01/16/2014 12/05/13   Kathie Dike, PA-C  HYDROcodone-acetaminophen (NORCO/VICODIN) 5-325 MG per tablet 1 or 2 tabs PO q6 hours prn pain 02/26/14   Samuel Jester, DO  ibuprofen (ADVIL,MOTRIN) 800 MG tablet Take 1 tablet (800 mg total) by mouth 3 (three) times daily. 03/12/14   Dale Roller, MD  methocarbamol (ROBAXIN) 500 MG tablet Take 2 tablets (1,000 mg total) by mouth 4 (four) times daily as needed for muscle spasms (muscle spasm/pain). 02/26/14   Samuel Jester, DO  naproxen (NAPROSYN) 250 MG tablet Take 1 tablet (250 mg total) by mouth 2 (two) times daily as needed for mild pain or moderate pain (take with food). 02/26/14   Samuel Jester, DO  ondansetron (ZOFRAN) 4 MG tablet Take 1 tablet (4 mg total) by mouth every 8 (eight) hours as needed for nausea or vomiting. Patient not taking: Reported on 01/16/2014 12/28/13   Samuel Jester, DO  oxyCODONE-acetaminophen (PERCOCET) 5-325 MG per tablet Take 1 tablet by mouth every 4 (four) hours as needed. 03/12/14   Dale Roller, MD  promethazine (PHENERGAN) 25 MG tablet  Take 1 tablet (25 mg total) by mouth every 6 (six) hours as needed for nausea or vomiting. 03/12/14   Dale Roller, MD  tamsulosin (FLOMAX) 0.4 MG CAPS capsule Take 1 capsule (0.4 mg total) by mouth 2 (two) times daily. 03/12/14   Dale Roller, MD   BP 133/98 mmHg  Pulse 81  Temp(Src) 98.1 F (36.7 C) (Oral)  Resp 16  Ht  (1.803 m)  Wt 205 lb (92.987 kg)  BMI 28.60 kg/m2  SpO2 96% Physical Exam  Constitutional: He appears well-developed and well-nourished. No distress.  HENT:  Head: Normocephalic and atraumatic.  Mouth/Throat: Oropharynx is clear and  moist. No oropharyngeal exudate.  Eyes: Conjunctivae and EOM are normal. Pupils are equal, round, and reactive to light. Right eye exhibits no discharge. Left eye exhibits no discharge. No scleral icterus.  Neck: Normal range of motion. Neck supple. No JVD present. No thyromegaly present.  Cardiovascular: Normal rate, regular rhythm, normal heart sounds and intact distal pulses.  Exam reveals no gallop and no friction rub.   No murmur heard. Pulmonary/Chest: Effort normal and breath sounds normal. No respiratory distress. He has no wheezes. He has no rales.  Abdominal: Soft. Bowel sounds are normal. He exhibits no distension and no mass. There is no tenderness.  L CVA mild ttp, no abd ttp  Musculoskeletal: Normal range of motion. He exhibits no edema or tenderness.  Lymphadenopathy:    He has no cervical adenopathy.  Neurological: He is alert. Coordination normal.  Skin: Skin is warm and dry. No rash noted. No erythema.  Psychiatric: He has a normal mood and affect. His behavior is normal.  Nursing note and vitals reviewed.   ED Course  Procedures (including critical care time)  DIAGNOSTIC STUDIES: Oxygen Saturation is 96% on ra, normal by my interpretation.    COORDINATION OF CARE: 8:01 AM - Discussed treatment plan with pt at bedside which includes injection and pt agreed to plan.   Labs Review Labs Reviewed  URINALYSIS, ROUTINE W REFLEX MICROSCOPIC - Abnormal; Notable for the following:    Specific Gravity, Urine >1.030 (*)    All other components within normal limits    Imaging Review No results found.    MDM   Final diagnoses:  Kidney stone on left side     The patient has normal vital signs, signs and symptoms consistent with a kidney stone which he has based on a CT scan performed in the last month. He does not need any further imaging, urinalysis to evaluate for infection otherwise simple straightforward treatment with kidney stone type medications including  Flomax, anti-inflammatories and Percocet. The patient is in agreement with the plan.  UA neg for blood or infection  Meds given in ED:  Medications  ketorolac (TORADOL) injection 60 mg (60 mg Intramuscular Given 03/12/14 0812)    New Prescriptions   IBUPROFEN (ADVIL,MOTRIN) 800 MG TABLET    Take 1 tablet (800 mg total) by mouth 3 (three) times daily.   OXYCODONE-ACETAMINOPHEN (PERCOCET) 5-325 MG PER TABLET    Take 1 tablet by mouth every 4 (four) hours as needed.   PROMETHAZINE (PHENERGAN) 25 MG TABLET    Take 1 tablet (25 mg total) by mouth every 6 (six) hours as needed for nausea or vomiting.   TAMSULOSIN (FLOMAX) 0.4 MG CAPS CAPSULE    Take 1 capsule (0.4 mg total) by mouth 2 (two) times daily.     I personally performed the services described in this documentation, which  was scribed in my presence. The recorded information has been reviewed and is accurate.       Dale RollerBrian D Maripat Borba, MD 03/12/14 [phone]

## 2014-03-12 NOTE — ED Notes (Signed)
Left flank pain since 530am.  Vomited times 2

## 2014-03-12 NOTE — Discharge Instructions (Signed)
Your exam and or your xrays have shown that you likely have a kidney stone.  You should follow up with the Urologist of your choosing or the Urologist listed above in the next 2-3 days if you have not passed the stone.  You should urinate in to the strainer until you pass the stone.    Flomax helps with passing the stone by opening up the Ureters (tubes), Vicodin and an antiinflammatory for pain if you are not allergic to these medicines.  Phenergan or Zofran for nausea.  Return to the ER for severe or worsening pain, vomiting or fevers or if you are unable to control your pain with the medicines provided.  Kidney Stones Kidney stones (ureteral lithiasis) are deposits that form inside your kidneys. The intense pain is caused by the stone moving through the urinary tract. When the stone moves, the ureter goes into spasm around the stone. The stone is usually passed in the urine.  CAUSES  A disorder that makes certain neck glands produce too much parathyroid hormone (primary hyperparathyroidism).  A buildup of uric acid crystals.  Narrowing (stricture) of the ureter.  A kidney obstruction present at birth (congenital obstruction).  Previous surgery on the kidney or ureters.  Numerous kidney infections.  SYMPTOMS  Feeling sick to your stomach (nauseous).  Throwing up (vomiting).  Blood in the urine (hematuria).  Pain that usually spreads (radiates) to the groin.  Frequency or urgency of urination.  DIAGNOSIS  Taking a history and physical exam.  Blood or urine tests.  Computerized X-ray scan (CT scan).  Occasionally, an examination of the inside of the urinary bladder (cystoscopy) is performed.  TREATMENT  Observation.  Increasing your fluid intake.  Surgery may be needed if you have severe pain or persistent obstruction.  The size, location, and chemical composition are all important variables that will determine the proper choice of action for you. Talk to your caregiver to better  understand your situation so that you will minimize the risk of injury to yourself and your kidney.  HOME CARE INSTRUCTIONS  Drink enough water and fluids to keep your urine clear or pale yellow.  Strain all urine through the provided strainer. Keep all particulate matter and stones for your caregiver to see. The stone causing the pain may be as small as a grain of salt. It is very important to use the strainer each and every time you pass your urine. The collection of your stone will allow your caregiver to analyze it and verify that a stone has actually passed.  Only take over-the-counter or prescription medicines for pain, discomfort, or fever as directed by your caregiver.  Make a follow-up appointment with your caregiver as directed.  Get follow-up X-rays if required. The absence of pain does not always mean that the stone has passed. It may have only stopped moving. If the urine remains completely obstructed, it can cause loss of kidney function or even complete destruction of the kidney. It is your responsibility to make sure X-rays and follow-ups are completed. Ultrasounds of the kidney can show blockages and the status of the kidney. Ultrasounds are not associated with any radiation and can be performed easily in a matter of minutes.  SEEK IMMEDIATE MEDICAL CARE IF:  Pain cannot be controlled with the prescribed medicine.  You have a fever.  The severity or intensity of pain increases over 18 hours and is not relieved by pain medicine.  You develop a new onset of abdominal pain.  You   feel faint or pass out.  MAKE SURE YOU:  Understand these instructions.  Will watch your condition.  Will get help right away if you are not doing well or get worse.  Document Released: 01/26/2005 Document Revised: 01/15/2011 Document Reviewed: 05/24/2009 ExitCare Patient Information 2012 [employer]  RESOURCE GUIDE  Chronic Pain Problems: Contact [facility]  [phone] Patients  need to be referred by their primary care doctor.  Insufficient Money for Medicine: Contact United Way:  call "211" or [facility] [phone].  No Primary Care Doctor: Call [facility]  [phone] - can help you locate a primary care doctor that  accepts your insurance, provides certain services, etc. Physician Referral Service- 1-800-533-3463  Agencies that provide inexpensive medical care: [facility]  [phone] [facility]  [phone] [facility]  [phone] [facility]  [phone] [facility]  [phone] [facility]  [phone]  Medicaid-accepting [place] Providers: [facility]- [address]  [phone], Mon-Fri 9am-7pm, Sat 9am-1pm [facility]- [address], [address]  [phone] [facility]- [address], [address]  [phone] [facility]- [address] [place] [address]  [phone] [facility]- [address], [address], [phone]  Only accepts [place] Access Medicaid patients after they have their name  applied to their card  Self Pay (no insurance) in [place]: Sickle Cell Patients: Dr Eric Dean, [facility]  [address], [phone] [facility]- [address]  [phone]       -     [facility] [place]- [address]       -     [facility]- see information above (Speak to Pam H if you do not have insurance)       -  [facility]- [address], [phone]       -  [facility] [place]- [address],  [phone]       -  [facility]- [address] [place] [address], [phone]       -  Dr Osei-Bonsu-  [address], [place], [phone]       -  [facility]- [address], [phone]       -  [facility]- [address] [place] [address], [phone], also [address], [phone]        -    [facility]- [address], [phone], 1st & 3rd Saturday   every month, 10am-1pm  1) Find a Doctor and Pay Out of Pocket Although you won't have to find out who is covered by your insurance plan, it is a good idea to ask around and get recommendations. You will then need to call the office and see if the doctor you have chosen will accept you as a new patient and what types of options they offer for patients who are self-pay. Some doctors offer discounts or will set up payment plans for their patients who do not have insurance, but you will need to ask so you aren't surprised when you get to your appointment.  2) Contact Your Local Health Department Not all health departments have doctors that can see patients for sick visits, but many do, so it is worth a call to see if yours does. If   you don't know where your local health department is, you can check in your phone book. The CDC also has a tool to help you locate your state's health department, and many state websites also have listings of all of their local health departments.  3) Find a Walk-in Clinic If your illness is not likely to be very severe or complicated, you may want to try a walk in clinic. These are popping up all over the country in pharmacies, drugstores, and shopping centers. They're usually staffed by nurse practitioners or physician assistants that have been trained to treat common illnesses and complaints. They're usually fairly quick and inexpensive. However, if you have serious medical issues or chronic medical problems, these are probably not your best option  STD Testing [place] [facility] [place], [facility], [address], [place], phone [phone] or [phone].  Monday - Friday, call for an appointment. [place] [facility] [place], [facility], [address], [place], phone [phone] or [phone].  Monday - Friday, call for an  appointment.  Abuse/Neglect: [place] Child Abuse Hotline (336) 641-3795 [place] Child Abuse Hotline 800-378-5315 (After Hours)  Emergency Shelter:  [facility] (336) 271-5985  Maternity Homes: [facility] (336) 275-9566 [facility] (704) 372-4663  MRSA Hotline #:   832-7006  [place] Resources  [facility] [place]  [employer] [place] [facility] [address]                 [address]         [address]  [place]                                               [place]                              [place] Phone:  [phone]                                  Phone:  [phone]                   Phone:  [phone]  [place] [facility], [phone] [place] Services - [facility]- 1-888-581-9988       -     [facility] in [place], [address],                                  336-349-4454, Insurance  [place] Child Abuse Hotline (336) 342-1394 or (336) 342-3537 (After Hours)   Behavioral Health Services  Substance Abuse Resources: [facility]  336-882-2125 [facility] 336-784-9470 [facility] 336-285-9073 [facility] 336-845-3988 Residential & Outpatient Substance Abuse Program  800-659-3381  Psychological Services: [facility]  [phone] [facility]  [phone] [facility], [address], [place], ACCESS LINE: 1-800-853-5163 or 336-641-4981, Http://www.[facility].com/services/adult.htm  Dental Assistance  If unable to pay or uninsured, contact:  [facility] or [place] [facility] to become qualified for the adult dental clinic.  Patients   with Medicaid: [facility] [address], [phone], [phone]  If unable to pay, or uninsured, contact  [facility] ([phone]) or [place] [facility] ([phone] in [place], [phone] in [place]) to become qualified for the adult dental clinic  Other Low-Cost Community Dental Services: [facility]- [address], [place], [place], [address], [phone], [address], 2nd and 4th Thursday of the month at 6:30am.  10 clients each day by appointment, can sometimes see walk-in patients if someone does not show for an appointment. [facility]- [address], [place], [place], [address], [phone] [facility]- [address], [place], [place], [address], [phone] [place] [facility]- [phone] [facility]- [phone] [facility]- [phone]      

## 2014-04-02 ENCOUNTER — Emergency Department (HOSPITAL_COMMUNITY): Payer: Self-pay

## 2014-04-02 ENCOUNTER — Emergency Department (HOSPITAL_COMMUNITY)
Admission: EM | Admit: 2014-04-02 | Discharge: 2014-04-02 | Disposition: A | Discharge status: Home / Self Care | Payer: Self-pay | Attending: Emergency Medicine | Admitting: Emergency Medicine

## 2014-04-02 ENCOUNTER — Encounter (HOSPITAL_COMMUNITY): Payer: Self-pay | Admitting: Emergency Medicine

## 2014-04-02 DIAGNOSIS — Z79899 Other long term (current) drug therapy: Secondary | ICD-10-CM | POA: Insufficient documentation

## 2014-04-02 DIAGNOSIS — M25561 Pain in right knee: Secondary | ICD-10-CM | POA: Insufficient documentation

## 2014-04-02 DIAGNOSIS — Z8709 Personal history of other diseases of the respiratory system: Secondary | ICD-10-CM | POA: Insufficient documentation

## 2014-04-02 DIAGNOSIS — G8929 Other chronic pain: Secondary | ICD-10-CM | POA: Insufficient documentation

## 2014-04-02 DIAGNOSIS — Z87442 Personal history of urinary calculi: Secondary | ICD-10-CM | POA: Insufficient documentation

## 2014-04-02 DIAGNOSIS — R52 Pain, unspecified: Secondary | ICD-10-CM

## 2014-04-02 DIAGNOSIS — Z720 Tobacco use: Secondary | ICD-10-CM | POA: Insufficient documentation

## 2014-04-02 DIAGNOSIS — Z791 Long term (current) use of non-steroidal anti-inflammatories (NSAID): Secondary | ICD-10-CM | POA: Insufficient documentation

## 2014-04-02 MED ORDER — NAPROXEN 250 MG PO TABS
500.0000 mg | ORAL_TABLET | Freq: Once | ORAL | Status: AC
  Administered 2014-04-02: 500 mg via ORAL
  Filled 2014-04-02: qty 2

## 2014-04-02 MED ORDER — NAPROXEN 500 MG PO TABS: 500.0000 mg | ORAL_TABLET | Freq: Two times a day (BID) | ORAL | Status: DC

## 2014-04-02 NOTE — ED Notes (Signed)
Pt states that he has been having right knee pain with no injury for the past week or so.

## 2014-04-02 NOTE — Discharge Instructions (Signed)
Please call your doctor for a followup appointment within 24-48 hours. When you talk to your doctor please let them know that you were seen in the emergency department and have them acquire all of your records so that they can discuss the findings with you and formulate a treatment plan to fully care for your new and ongoing problems.  Your xray was normal

## 2014-04-02 NOTE — ED Provider Notes (Signed)
CSN: [id number]     Arrival date & time 04/02/14  0705 History  This chart was scribed for Dale Roller, MD by Ronney Lion, ED Scribe. This patient was seen in room APA05/APA05 and the patient's care was started at 7:34 AM.    Chief Complaint  Patient presents with  . Knee Pain   The history is provided by the patient. No language interpreter was used.     HPI Comments: Dale Martinez is a 29 y.o. male who presents to the Emergency Department complaining of gradual-onset, constant, right knee pain that began 4-5 days ago, exacerbated by bending his knee. He denies any known trauma or injury. He denies left knee pain, fever, chills, or rashes. He denies recent surgery, denies any other joint abnormalities.   Past Medical History  Diagnosis Date  . Strep throat   . Kidney stone   . Chronic flank pain    History reviewed. No pertinent past surgical history. Family History  Problem Relation Age of Onset  . Diabetes Father    History  Substance Use Topics  . Smoking status: Current Every Day Smoker -- 1.00 packs/day    Types: Cigarettes  . Smokeless tobacco: Never Used  . Alcohol Use: No    Review of Systems  Constitutional: Negative for fever and chills.  Musculoskeletal: Positive for arthralgias.  Skin: Negative for rash.      Allergies  Review of patient's allergies indicates no known allergies.  Home Medications   Prior to Admission medications   Medication Sig Start Date End Date Taking? Authorizing Provider  Aspirin-Salicylamide-Caffeine (BC HEADACHE POWDER PO) Take 2 packets by mouth daily as needed (pain).    Historical Provider, MD  cyclobenzaprine (FLEXERIL) 5 MG tablet Take 1 tablet (5 mg total) by mouth 3 (three) times daily as needed for muscle spasms. Patient not taking: Reported on 01/16/2014 12/04/13   Layla Maw Ward, DO  dexamethasone (DECADRON) 4 MG tablet 1 po bid with food Patient not taking: Reported on 01/16/2014 12/05/13   Kathie Dike, PA-C   diclofenac (VOLTAREN) 75 MG EC tablet Take 1 tablet (75 mg total) by mouth 2 (two) times daily. Patient not taking: Reported on 01/16/2014 12/05/13   Kathie Dike, PA-C  HYDROcodone-acetaminophen (NORCO/VICODIN) 5-325 MG per tablet 1 or 2 tabs PO q6 hours prn pain 02/26/14   Samuel Jester, DO  ibuprofen (ADVIL,MOTRIN) 800 MG tablet Take 1 tablet (800 mg total) by mouth 3 (three) times daily. 03/12/14   Dale Roller, MD  methocarbamol (ROBAXIN) 500 MG tablet Take 2 tablets (1,000 mg total) by mouth 4 (four) times daily as needed for muscle spasms (muscle spasm/pain). 02/26/14   Samuel Jester, DO  naproxen (NAPROSYN) 500 MG tablet Take 1 tablet (500 mg total) by mouth 2 (two) times daily with a meal. 04/02/14   Dale Roller, MD  ondansetron (ZOFRAN) 4 MG tablet Take 1 tablet (4 mg total) by mouth every 8 (eight) hours as needed for nausea or vomiting. Patient not taking: Reported on 01/16/2014 12/28/13   Samuel Jester, DO  oxyCODONE-acetaminophen (PERCOCET) 5-325 MG per tablet Take 1 tablet by mouth every 4 (four) hours as needed. 03/12/14   Dale Roller, MD  promethazine (PHENERGAN) 25 MG tablet Take 1 tablet (25 mg total) by mouth every 6 (six) hours as needed for nausea or vomiting. 03/12/14   Dale Roller, MD  tamsulosin (FLOMAX) 0.4 MG CAPS capsule Take 1 capsule (0.4 mg total) by mouth 2 (  two) times daily. 03/12/14   Dale RollerBrian D Nashika Coker, MD   BP 138/86 mmHg  Pulse 84  Temp(Src) 97.8 F (36.6 C) (Oral)  Resp 18  Ht 5\' 11"  (1.803 m)  Wt 205 lb (92.987 kg)  BMI 28.60 kg/m2  SpO2 100% Physical Exam  Constitutional: He appears well-developed and well-nourished.  HENT:  Head: Normocephalic and atraumatic.  Eyes: Conjunctivae are normal. Right eye exhibits no discharge. Left eye exhibits no discharge.  Pulmonary/Chest: Effort normal. No respiratory distress.  Musculoskeletal: He exhibits tenderness.  Tenderness over the right medial tibial plateau. Full ROM, supple, no effusions.  Preserved SLR.  Neurological: He is alert. Coordination normal.  Skin: Skin is warm and dry. No rash noted. He is not diaphoretic. No erythema.  Psychiatric: He has a normal mood and affect.  Nursing note and vitals reviewed.   ED Course  Procedures (including critical care time)   DIAGNOSTIC STUDIES: Oxygen Saturation is 100% on room air, normal by my interpretation.    COORDINATION OF CARE: 7:40 AM - Discussed treatment plan with pt at bedside which includes XR, and pt agreed to plan.   Labs Review Labs Reviewed - No data to display  Imaging Review Dg Knee Complete 4 Views Right  04/02/2014   CLINICAL DATA:  Medial sided right knee pain x4 days  EXAM: RIGHT KNEE - COMPLETE 4+ VIEW  COMPARISON:  None.  FINDINGS: No fracture of the proximal tibia or distal femur. Patella is normal. No joint effusion.  IMPRESSION: No acute osseous abnormality.   Electronically Signed   By: Genevive BiStewart  Edmunds M.D.   On: 04/02/2014 08:17      MDM   Final diagnoses:  Pain  Knee pain, right    The patient has a relatively normal exam of his knee except for the tenderness over the medial joint, there is no crepitance, no locking, no effusion, no redness, no swelling. Vital signs are normal as well. X-ray pending, anticipate discharge with anti-inflammatory and follow-up as needed. This does not appear consistent with a septic joint or acute gouty arthritis.  xrays reviewed and appear normal - no signs of effusion / fracture or other complications  Meds given in ED:  Medications  naproxen (NAPROSYN) tablet 500 mg (500 mg Oral Given 04/02/14 0802)    New Prescriptions   NAPROXEN (NAPROSYN) 500 MG TABLET    Take 1 tablet (500 mg total) by mouth 2 (two) times daily with a meal.      I personally performed the services described in this documentation, which was scribed in my presence. The recorded information has been reviewed and is accurate.       Dale RollerBrian D Axell Trigueros, MD 04/02/14 [phone]

## 2014-04-30 ENCOUNTER — Encounter (HOSPITAL_COMMUNITY): Payer: Self-pay

## 2014-04-30 ENCOUNTER — Emergency Department (HOSPITAL_COMMUNITY)
Admission: EM | Admit: 2014-04-30 | Discharge: 2014-04-30 | Disposition: A | Discharge status: Home / Self Care | Payer: Self-pay | Attending: Emergency Medicine | Admitting: Emergency Medicine

## 2014-04-30 DIAGNOSIS — J028 Acute pharyngitis due to other specified organisms: Secondary | ICD-10-CM

## 2014-04-30 DIAGNOSIS — Z87442 Personal history of urinary calculi: Secondary | ICD-10-CM | POA: Insufficient documentation

## 2014-04-30 DIAGNOSIS — B9789 Other viral agents as the cause of diseases classified elsewhere: Secondary | ICD-10-CM

## 2014-04-30 DIAGNOSIS — Z720 Tobacco use: Secondary | ICD-10-CM | POA: Insufficient documentation

## 2014-04-30 DIAGNOSIS — Z7952 Long term (current) use of systemic steroids: Secondary | ICD-10-CM | POA: Insufficient documentation

## 2014-04-30 DIAGNOSIS — G8929 Other chronic pain: Secondary | ICD-10-CM | POA: Insufficient documentation

## 2014-04-30 DIAGNOSIS — Z791 Long term (current) use of non-steroidal anti-inflammatories (NSAID): Secondary | ICD-10-CM | POA: Insufficient documentation

## 2014-04-30 DIAGNOSIS — J029 Acute pharyngitis, unspecified: Secondary | ICD-10-CM | POA: Insufficient documentation

## 2014-04-30 DIAGNOSIS — Z79899 Other long term (current) drug therapy: Secondary | ICD-10-CM | POA: Insufficient documentation

## 2014-04-30 LAB — RAPID STREP SCREEN (MED CTR MEBANE ONLY): STREPTOCOCCUS, GROUP A SCREEN (DIRECT): NEGATIVE

## 2014-04-30 NOTE — ED Notes (Signed)
I have a history of strep throat. My throat has been hurting since yesterday per pt.

## 2014-04-30 NOTE — Discharge Instructions (Signed)
Tylenol of motrin for fever or aches.  Drink plenty of fluids

## 2014-04-30 NOTE — ED Provider Notes (Signed)
CSN: [id number]     Arrival date & time 04/30/14  0620 History  This chart was scribed for Dale Berkshire, MD by Ronney Lion, ED Scribe. This patient was seen in room APA05/APA05 and the patient's care was started at 7:35 AM.    Chief Complaint  Patient presents with  . Sore Throat   Patient is a 29 y.o. male presenting with pharyngitis. The history is provided by the patient. No language interpreter was used.  Sore Throat This is a recurrent problem. The current episode started yesterday. The problem occurs constantly. The problem has not changed since onset.Pertinent negatives include no chest pain, no abdominal pain and no headaches. Nothing aggravates the symptoms. Nothing relieves the symptoms. He has tried nothing for the symptoms.     HPI Comments: Dale Martinez is a 29 y.o. male who presents to the Emergency Department complaining of a constant sore throat since yesterday. He endorses associated rhinorrhea. Patient denies any fever.  Past Medical History  Diagnosis Date  . Strep throat   . Kidney stone   . Chronic flank pain    History reviewed. No pertinent past surgical history. Family History  Problem Relation Age of Onset  . Diabetes Father    History  Substance Use Topics  . Smoking status: Current Every Day Smoker -- 1.00 packs/day    Types: Cigarettes  . Smokeless tobacco: Never Used  . Alcohol Use: No    Review of Systems  Constitutional: Negative for fever, appetite change and fatigue.  HENT: Positive for rhinorrhea and sore throat. Negative for congestion, ear discharge and sinus pressure.   Eyes: Negative for discharge.  Respiratory: Negative for cough.   Cardiovascular: Negative for chest pain.  Gastrointestinal: Negative for abdominal pain and diarrhea.  Genitourinary: Negative for frequency and hematuria.  Musculoskeletal: Negative for back pain.  Skin: Negative for rash.  Neurological: Negative for seizures and headaches.  Psychiatric/Behavioral:  Negative for hallucinations.      Allergies  Review of patient's allergies indicates no known allergies.  Home Medications   Prior to Admission medications   Medication Sig Start Date End Date Taking? Authorizing Provider  Aspirin-Salicylamide-Caffeine (BC HEADACHE POWDER PO) Take 2 packets by mouth daily as needed (pain).    Historical Provider, MD  cyclobenzaprine (FLEXERIL) 5 MG tablet Take 1 tablet (5 mg total) by mouth 3 (three) times daily as needed for muscle spasms. Patient not taking: Reported on 01/16/2014 12/04/13   Layla Maw Ward, DO  dexamethasone (DECADRON) 4 MG tablet 1 po bid with food Patient not taking: Reported on 01/16/2014 12/05/13   Ivery Quale, PA-C  diclofenac (VOLTAREN) 75 MG EC tablet Take 1 tablet (75 mg total) by mouth 2 (two) times daily. Patient not taking: Reported on 01/16/2014 12/05/13   Ivery Quale, PA-C  HYDROcodone-acetaminophen (NORCO/VICODIN) 5-325 MG per tablet 1 or 2 tabs PO q6 hours prn pain 02/26/14   Samuel Jester, DO  ibuprofen (ADVIL,MOTRIN) 800 MG tablet Take 1 tablet (800 mg total) by mouth 3 (three) times daily. 03/12/14   Eber Hong, MD  methocarbamol (ROBAXIN) 500 MG tablet Take 2 tablets (1,000 mg total) by mouth 4 (four) times daily as needed for muscle spasms (muscle spasm/pain). 02/26/14   Samuel Jester, DO  naproxen (NAPROSYN) 500 MG tablet Take 1 tablet (500 mg total) by mouth 2 (two) times daily with a meal. 04/02/14   Eber Hong, MD  ondansetron (ZOFRAN) 4 MG tablet Take 1 tablet (4 mg total) by mouth every 8 (eight)  hours as needed for nausea or vomiting. Patient not taking: Reported on 01/16/2014 12/28/13   Samuel JesterKathleen McManus, DO  oxyCODONE-acetaminophen (PERCOCET) 5-325 MG per tablet Take 1 tablet by mouth every 4 (four) hours as needed. 03/12/14   Eber HongBrian Miller, MD  promethazine (PHENERGAN) 25 MG tablet Take 1 tablet (25 mg total) by mouth every 6 (six) hours as needed for nausea or vomiting. 03/12/14   Eber HongBrian Miller, MD   tamsulosin (FLOMAX) 0.4 MG CAPS capsule Take 1 capsule (0.4 mg total) by mouth 2 (two) times daily. 03/12/14   Eber HongBrian Miller, MD   BP 142/77 mmHg  Pulse 101  Temp(Src) 97.8 F (36.6 C) (Oral)  Resp 16  Ht 5\' 11"  (1.803 m)  Wt 200 lb (90.719 kg)  BMI 27.91 kg/m2  SpO2 100% Physical Exam  Constitutional: He is oriented to person, place, and time. He appears well-developed.  HENT:  Head: Normocephalic.  Pharynx is minimally inflamed.  Eyes: Conjunctivae and EOM are normal. No scleral icterus.  Neck: Neck supple. No thyromegaly present.  Cardiovascular: Normal rate and regular rhythm.  Exam reveals no gallop and no friction rub.   No murmur heard. Pulmonary/Chest: No stridor. He has no wheezes. He has no rales. He exhibits no tenderness.  Abdominal: He exhibits no distension. There is no tenderness. There is no rebound.  Musculoskeletal: Normal range of motion. He exhibits no edema.  Lymphadenopathy:    He has no cervical adenopathy.  Neurological: He is oriented to person, place, and time. He exhibits normal muscle tone. Coordination normal.  Skin: No rash noted. No erythema.  Psychiatric: He has a normal mood and affect. His behavior is normal.  Nursing note and vitals reviewed.   ED Course  Procedures (including critical care time)  DIAGNOSTIC STUDIES: Oxygen Saturation is 100% on room air, normal by my interpretation.    COORDINATION OF CARE: 7:36 AM - I suspect viral pharyngitis. Discussed treatment plan with pt at bedside which includes Tylenol, Motrin, and fluids, and pt agreed to plan.   Labs Review Results for orders placed or performed during the hospital encounter of 04/30/14  Rapid strep screen  Result Value Ref Range   Streptococcus, Group A Screen (Direct) NEGATIVE NEGATIVE     MDM   Final diagnoses:  None   Pharyngitis,  tx with tylenol, fluids and rest  The chart was scribed for me under my direct supervision.  I personally performed the history,  physical, and medical decision making and all procedures in the evaluation of this patient.Dale Martinez.    Ciella Obi, MD 04/30/14 [phone]

## 2014-05-02 LAB — CULTURE, GROUP A STREP: Strep A Culture: NEGATIVE

## 2014-05-19 ENCOUNTER — Emergency Department (HOSPITAL_COMMUNITY)
Admission: EM | Admit: 2014-05-19 | Discharge: 2014-05-19 | Disposition: A | Discharge status: Home / Self Care | Payer: Self-pay | Attending: Emergency Medicine | Admitting: Emergency Medicine

## 2014-05-19 ENCOUNTER — Encounter (HOSPITAL_COMMUNITY): Payer: Self-pay | Admitting: Emergency Medicine

## 2014-05-19 DIAGNOSIS — R1030 Lower abdominal pain, unspecified: Secondary | ICD-10-CM | POA: Insufficient documentation

## 2014-05-19 DIAGNOSIS — Z791 Long term (current) use of non-steroidal anti-inflammatories (NSAID): Secondary | ICD-10-CM | POA: Insufficient documentation

## 2014-05-19 DIAGNOSIS — Z7952 Long term (current) use of systemic steroids: Secondary | ICD-10-CM | POA: Insufficient documentation

## 2014-05-19 DIAGNOSIS — G8929 Other chronic pain: Secondary | ICD-10-CM | POA: Insufficient documentation

## 2014-05-19 DIAGNOSIS — R109 Unspecified abdominal pain: Secondary | ICD-10-CM

## 2014-05-19 DIAGNOSIS — Z8709 Personal history of other diseases of the respiratory system: Secondary | ICD-10-CM | POA: Insufficient documentation

## 2014-05-19 DIAGNOSIS — Z79899 Other long term (current) drug therapy: Secondary | ICD-10-CM | POA: Insufficient documentation

## 2014-05-19 DIAGNOSIS — Z720 Tobacco use: Secondary | ICD-10-CM | POA: Insufficient documentation

## 2014-05-19 DIAGNOSIS — Z87442 Personal history of urinary calculi: Secondary | ICD-10-CM | POA: Insufficient documentation

## 2014-05-19 LAB — URINALYSIS, ROUTINE W REFLEX MICROSCOPIC
BILIRUBIN URINE: NEGATIVE
Glucose, UA: NEGATIVE mg/dL
Hgb urine dipstick: NEGATIVE
Ketones, ur: NEGATIVE mg/dL
Leukocytes, UA: NEGATIVE
Nitrite: NEGATIVE
Protein, ur: NEGATIVE mg/dL
UROBILINOGEN UA: 0.2 mg/dL (ref 0.0–1.0)
pH: 5.5 (ref 5.0–8.0)

## 2014-05-19 MED ORDER — ETODOLAC 400 MG PO TABS
400.0000 mg | ORAL_TABLET | Freq: Two times a day (BID) | ORAL | Status: DC | PRN
Start: 2014-05-19 — End: 2014-09-18

## 2014-05-19 MED ORDER — KETOROLAC TROMETHAMINE 60 MG/2ML IM SOLN
60.0000 mg | Freq: Once | INTRAMUSCULAR | Status: AC
  Administered 2014-05-19: 60 mg via INTRAMUSCULAR
  Filled 2014-05-19: qty 2

## 2014-05-19 NOTE — ED Notes (Signed)
Patient with no complaints at this time. Respirations even and unlabored. Skin warm/dry. Discharge instructions reviewed with patient at this time. Patient given opportunity to voice concerns/ask questions. Patient discharged at this time and left Emergency Department with steady gait.   

## 2014-05-19 NOTE — ED Provider Notes (Signed)
CSN: [id number]     Arrival date & time 05/19/14  1452 History   First MD Initiated Contact with Patient 05/19/14 1503     Chief Complaint  Patient presents with  . Flank Pain     (Consider location/radiation/quality/duration/timing/severity/associated sxs/prior Treatment) HPI Comments: Patient presents to the ER for evaluation of left flank pain. Patient reports that he started having pain in the left lower back area at 11 AM and now the pain has migrated to the left lower groin area. No fever, nausea or vomiting. He has noticed some increase in the symptoms with urination. Patient reports a history of previous kidney stone with similar.  Patient is a 29 y.o. male presenting with flank pain.  Flank Pain    Past Medical History  Diagnosis Date  . Strep throat   . Kidney stone   . Chronic flank pain    History reviewed. No pertinent past surgical history. Family History  Problem Relation Age of Onset  . Diabetes Father    History  Substance Use Topics  . Smoking status: Current Every Day Smoker -- 1.00 packs/day    Types: Cigarettes  . Smokeless tobacco: Never Used  . Alcohol Use: No    Review of Systems  Genitourinary: Positive for flank pain.  All other systems reviewed and are negative.     Allergies  Review of patient's allergies indicates no known allergies.  Home Medications   Prior to Admission medications   Medication Sig Start Date End Date Taking? Authorizing Provider  Aspirin-Salicylamide-Caffeine (BC HEADACHE POWDER PO) Take 2 packets by mouth daily as needed (pain).    Historical Provider, MD  cyclobenzaprine (FLEXERIL) 5 MG tablet Take 1 tablet (5 mg total) by mouth 3 (three) times daily as needed for muscle spasms. Patient not taking: Reported on 01/16/2014 12/04/13   Layla Maw Ward, DO  dexamethasone (DECADRON) 4 MG tablet 1 po bid with food Patient not taking: Reported on 01/16/2014 12/05/13   Ivery Quale, PA-C  diclofenac (VOLTAREN) 75 MG EC  tablet Take 1 tablet (75 mg total) by mouth 2 (two) times daily. Patient not taking: Reported on 01/16/2014 12/05/13   Ivery Quale, PA-C  HYDROcodone-acetaminophen (NORCO/VICODIN) 5-325 MG per tablet 1 or 2 tabs PO q6 hours prn pain 02/26/14   Samuel Jester, DO  ibuprofen (ADVIL,MOTRIN) 800 MG tablet Take 1 tablet (800 mg total) by mouth 3 (three) times daily. 03/12/14   Eber Hong, MD  methocarbamol (ROBAXIN) 500 MG tablet Take 2 tablets (1,000 mg total) by mouth 4 (four) times daily as needed for muscle spasms (muscle spasm/pain). 02/26/14   Samuel Jester, DO  naproxen (NAPROSYN) 500 MG tablet Take 1 tablet (500 mg total) by mouth 2 (two) times daily with a meal. 04/02/14   Eber Hong, MD  ondansetron (ZOFRAN) 4 MG tablet Take 1 tablet (4 mg total) by mouth every 8 (eight) hours as needed for nausea or vomiting. Patient not taking: Reported on 01/16/2014 12/28/13   Samuel Jester, DO  oxyCODONE-acetaminophen (PERCOCET) 5-325 MG per tablet Take 1 tablet by mouth every 4 (four) hours as needed. 03/12/14   Eber Hong, MD  promethazine (PHENERGAN) 25 MG tablet Take 1 tablet (25 mg total) by mouth every 6 (six) hours as needed for nausea or vomiting. 03/12/14   Eber Hong, MD  tamsulosin (FLOMAX) 0.4 MG CAPS capsule Take 1 capsule (0.4 mg total) by mouth 2 (two) times daily. 03/12/14   Eber Hong, MD   BP 145/87 mmHg  Pulse 88  Temp(Src) 98.2 F (36.8 C) (Oral)  Resp 16  Ht 5\' 11"  (1.803 m)  Wt 205 lb (92.987 kg)  BMI 28.60 kg/m2  SpO2 99% Physical Exam  Constitutional: He is oriented to person, place, and time. He appears well-developed and well-nourished. No distress.  HENT:  Head: Normocephalic and atraumatic.  Right Ear: Hearing normal.  Left Ear: Hearing normal.  Nose: Nose normal.  Mouth/Throat: Oropharynx is clear and moist and mucous membranes are normal.  Eyes: Conjunctivae and EOM are normal. Pupils are equal, round, and reactive to light.  Neck: Normal range of motion.  Neck supple.  Cardiovascular: Regular rhythm, S1 normal and S2 normal.  Exam reveals no gallop and no friction rub.   No murmur heard. Pulmonary/Chest: Effort normal and breath sounds normal. No respiratory distress. He exhibits no tenderness.  Abdominal: Soft. Normal appearance and bowel sounds are normal. There is no hepatosplenomegaly. There is no tenderness. There is no rebound, no guarding, no tenderness at McBurney's point and negative Murphy's sign. No hernia.  Musculoskeletal: Normal range of motion.       Lumbar back: He exhibits tenderness.       Back:  Neurological: He is alert and oriented to person, place, and time. He has normal strength. No cranial nerve deficit or sensory deficit. Coordination normal. GCS eye subscore is 4. GCS verbal subscore is 5. GCS motor subscore is 6.  Skin: Skin is warm, dry and intact. No rash noted. No cyanosis.  Psychiatric: He has a normal mood and affect. His speech is normal and behavior is normal. Thought content normal.  Nursing note and vitals reviewed.   ED Course  Procedures (including critical care time) Labs Review Labs Reviewed  URINALYSIS, ROUTINE W REFLEX MICROSCOPIC - Abnormal; Notable for the following:    Specific Gravity, Urine >1.030 (*)    All other components within normal limits    Imaging Review No results found.   EKG Interpretation None      MDM   Final diagnoses:  None   flank pain  Patient presents to the ER for evaluation of flank pain. Patient reports onset of pain at 11 AM with progression into the left groin over the course of the last 4 hours. Patient reports a history of previous kidney stones. His urinalysis, however is unremarkable. No sign of infection, no endoscopic hematuria. Reviewing his chart reveals a history of chronic flank pain with multiple visits with similar presentation. Imaging at all previous visits did not show any obstructing stones. Symptoms most likely consistent with his chronic  pain syndrome.    Gilda Creasehristopher J Peighton Mehra, MD 05/19/14 1524

## 2014-05-19 NOTE — ED Notes (Signed)
Left flank pain since this am. Pt reports pain radiating to left groin. Pt denies any urinary symptoms.

## 2014-05-19 NOTE — Progress Notes (Signed)
Patient received referral information on previous ED visits no information given.

## 2014-05-19 NOTE — Discharge Instructions (Signed)
Flank Pain °Flank pain refers to pain that is located on the side of the body between the upper abdomen and the back. The pain may occur over a short period of time (acute) or may be long-term or reoccurring (chronic). It may be mild or severe. Flank pain can be caused by many things. °CAUSES  °Some of the more common causes of flank pain include: °· Muscle strains.   °· Muscle spasms.   °· A disease of your spine (vertebral disk disease).   °· A lung infection (pneumonia).   °· Fluid around your lungs (pulmonary edema).   °· A kidney infection.   °· Kidney stones.   °· A very painful skin rash caused by the chickenpox virus (shingles).   °· Gallbladder disease.   °HOME CARE INSTRUCTIONS  °Home care will depend on the cause of your pain. In general, °· Rest as directed by your caregiver. °· Drink enough fluids to keep your urine clear or pale yellow. °· Only take over-the-counter or prescription medicines as directed by your caregiver. Some medicines may help relieve the pain. °· Tell your caregiver about any changes in your pain. °· Follow up with your caregiver as directed. °SEEK IMMEDIATE MEDICAL CARE IF:  °· Your pain is not controlled with medicine.   °· You have new or worsening symptoms. °· Your pain increases.   °· You have abdominal pain.   °· You have shortness of breath.   °· You have persistent nausea or vomiting.   °· You have swelling in your abdomen.   °· You feel faint or pass out.   °· You have blood in your urine. °· You have a fever or persistent symptoms for more than 2-3 days. °· You have a fever and your symptoms suddenly get worse. °MAKE SURE YOU:  °· Understand these instructions. °· Will watch your condition. °· Will get help right away if you are not doing well or get worse. °Document Released: 03/19/2005 Document Revised: 10/21/2011 Document Reviewed: 09/10/2011 °ExitCare® Patient Information ©2015 [employer]. This information is not intended to replace advice given to you by your  health care provider. Make sure you discuss any questions you have with your health care provider. ° °

## 2014-06-18 ENCOUNTER — Emergency Department (HOSPITAL_COMMUNITY)
Admission: EM | Admit: 2014-06-18 | Discharge: 2014-06-19 | Disposition: A | Discharge status: Home / Self Care | Payer: Self-pay | Attending: Emergency Medicine | Admitting: Emergency Medicine

## 2014-06-18 ENCOUNTER — Encounter (HOSPITAL_COMMUNITY): Payer: Self-pay | Admitting: *Deleted

## 2014-06-18 DIAGNOSIS — Z720 Tobacco use: Secondary | ICD-10-CM | POA: Insufficient documentation

## 2014-06-18 DIAGNOSIS — Z791 Long term (current) use of non-steroidal anti-inflammatories (NSAID): Secondary | ICD-10-CM | POA: Insufficient documentation

## 2014-06-18 DIAGNOSIS — G8929 Other chronic pain: Secondary | ICD-10-CM | POA: Insufficient documentation

## 2014-06-18 DIAGNOSIS — Z87442 Personal history of urinary calculi: Secondary | ICD-10-CM | POA: Insufficient documentation

## 2014-06-18 DIAGNOSIS — R109 Unspecified abdominal pain: Secondary | ICD-10-CM

## 2014-06-18 DIAGNOSIS — Z79899 Other long term (current) drug therapy: Secondary | ICD-10-CM | POA: Insufficient documentation

## 2014-06-18 DIAGNOSIS — R1084 Generalized abdominal pain: Secondary | ICD-10-CM | POA: Insufficient documentation

## 2014-06-18 DIAGNOSIS — Z8709 Personal history of other diseases of the respiratory system: Secondary | ICD-10-CM | POA: Insufficient documentation

## 2014-06-18 LAB — URINE MICROSCOPIC-ADD ON

## 2014-06-18 LAB — URINALYSIS, ROUTINE W REFLEX MICROSCOPIC
BILIRUBIN URINE: NEGATIVE
Glucose, UA: NEGATIVE mg/dL
Ketones, ur: NEGATIVE mg/dL
Nitrite: NEGATIVE
Specific Gravity, Urine: >1.03 — ABNORMAL HIGH (ref 1.005–1.030)
UROBILINOGEN UA: 0.2 mg/dL (ref 0.0–1.0)
pH: 5.5 (ref 5.0–8.0)

## 2014-06-18 NOTE — ED Notes (Signed)
Pt c/o back pain with n/v; pt states the pain is radiating to around the front of his abdomen

## 2014-06-19 MED ORDER — CYCLOBENZAPRINE HCL 10 MG PO TABS
10.0000 mg | ORAL_TABLET | Freq: Once | ORAL | Status: AC
  Administered 2014-06-19: 10 mg via ORAL
  Filled 2014-06-19: qty 1

## 2014-06-19 MED ORDER — KETOROLAC TROMETHAMINE 60 MG/2ML IM SOLN
60.0000 mg | Freq: Once | INTRAMUSCULAR | Status: AC
  Administered 2014-06-19: 60 mg via INTRAMUSCULAR
  Filled 2014-06-19: qty 2

## 2014-06-19 MED ORDER — CYCLOBENZAPRINE HCL 10 MG PO TABS: 10.0000 mg | ORAL_TABLET | Freq: Three times a day (TID) | ORAL | Status: DC | PRN

## 2014-06-19 MED ORDER — NAPROXEN 500 MG PO TABS: 500.0000 mg | ORAL_TABLET | Freq: Two times a day (BID) | ORAL | Status: DC

## 2014-06-19 NOTE — ED Notes (Signed)
Pt ambulating independently w/ steady gait on d/c in no acute distress, A&Ox4. D/c instructions reviewed w/ pt and family - pt and family deny any further questions or concerns at present. Rx given x2  

## 2014-06-19 NOTE — ED Notes (Signed)
Pt reports left flank and lower back pain - pt admits to hx of chronic back pain however states tonight experienced and episode of n/v while at work. Pt admits while in ED has voided again and states "I am sure it is a kidney stone b/c it feels like its trying to move."

## 2014-06-19 NOTE — ED Provider Notes (Signed)
CSN: [id number]     Arrival date & time 06/18/14  2156 History   First MD Initiated Contact with Patient 06/19/14 0142     Chief Complaint  Patient presents with  . Back Pain     (Consider location/radiation/quality/duration/timing/severity/associated sxs/prior Treatment) HPI  This is the patient's 13th or 14th ED visit in the past 6 months. Patient has a history of nephrolithiasis however normally they are in the kidney and he is not passing any ureteral stones. He states he has some lower left-sided flank pain past few days. He states he vomited twice between 9 and 10 PM tonight. He states laying flat on his back makes the pain worse, laying on his right side makes it feel better. He denies any fever or blood in his urine. He states he was seeing Dr. Jerre SimonJavaid however he doesn't see him anymore. He was advised to stop drinking sodas.  PCP none  Past Medical History  Diagnosis Date  . Strep throat   . Kidney stone   . Chronic flank pain    History reviewed. No pertinent past surgical history. Family History  Problem Relation Age of Onset  . Diabetes Father    History  Substance Use Topics  . Smoking status: Current Every Day Smoker -- 1.00 packs/day    Types: Cigarettes  . Smokeless tobacco: Never Used  . Alcohol Use: No    Review of Systems  All other systems reviewed and are negative.     Allergies  Review of patient's allergies indicates no known allergies.  Home Medications   Prior to Admission medications   Medication Sig Start Date End Date Taking? Authorizing Provider  Aspirin-Salicylamide-Caffeine (BC HEADACHE POWDER PO) Take 2 packets by mouth daily as needed (pain).    Historical Provider, MD  cyclobenzaprine (FLEXERIL) 10 MG tablet Take 1 tablet (10 mg total) by mouth 3 (three) times daily as needed (muscle soreness). 06/19/14   Devoria AlbeIva Al Bracewell, MD  dexamethasone (DECADRON) 4 MG tablet 1 po bid with food Patient not taking: Reported on 01/16/2014 12/05/13   Ivery QualeHobson  Bryant, PA-C  diclofenac (VOLTAREN) 75 MG EC tablet Take 1 tablet (75 mg total) by mouth 2 (two) times daily. Patient not taking: Reported on 01/16/2014 12/05/13   Ivery QualeHobson Bryant, PA-C  etodolac (LODINE) 400 MG tablet Take 1 tablet (400 mg total) by mouth 2 (two) times daily as needed for moderate pain. 05/19/14   Gilda Creasehristopher J Pollina, MD  HYDROcodone-acetaminophen (NORCO/VICODIN) 5-325 MG per tablet 1 or 2 tabs PO q6 hours prn pain 02/26/14   Samuel JesterKathleen McManus, DO  ibuprofen (ADVIL,MOTRIN) 800 MG tablet Take 1 tablet (800 mg total) by mouth 3 (three) times daily. 03/12/14   Eber HongBrian Miller, MD  methocarbamol (ROBAXIN) 500 MG tablet Take 2 tablets (1,000 mg total) by mouth 4 (four) times daily as needed for muscle spasms (muscle spasm/pain). 02/26/14   Samuel JesterKathleen McManus, DO  naproxen (NAPROSYN) 500 MG tablet Take 1 tablet (500 mg total) by mouth 2 (two) times daily. 06/19/14   Devoria AlbeIva Charma Mocarski, MD  ondansetron (ZOFRAN) 4 MG tablet Take 1 tablet (4 mg total) by mouth every 8 (eight) hours as needed for nausea or vomiting. Patient not taking: Reported on 01/16/2014 12/28/13   Samuel JesterKathleen McManus, DO  oxyCODONE-acetaminophen (PERCOCET) 5-325 MG per tablet Take 1 tablet by mouth every 4 (four) hours as needed. 03/12/14   Eber HongBrian Miller, MD  promethazine (PHENERGAN) 25 MG tablet Take 1 tablet (25 mg total) by mouth every 6 (six) hours as needed  for nausea or vomiting. 03/12/14   Eber HongBrian Miller, MD  tamsulosin (FLOMAX) 0.4 MG CAPS capsule Take 1 capsule (0.4 mg total) by mouth 2 (two) times daily. 03/12/14   Eber HongBrian Miller, MD   BP 126/83 mmHg  Pulse 72  Temp(Src) 99 F (37.2 C) (Oral)  Resp 20  Ht 5\' 11"  (1.803 m)  Wt 205 lb (92.987 kg)  BMI 28.60 kg/m2  SpO2 99%  Vital signs normal   Physical Exam  Constitutional: He is oriented to person, place, and time. He appears well-developed and well-nourished.  Non-toxic appearance. He does not appear ill. No distress.  Patient calmly laying on his right side holding a normal  conversation in no distress  HENT:  Head: Normocephalic and atraumatic.  Right Ear: External ear normal.  Left Ear: External ear normal.  Nose: Nose normal. No mucosal edema or rhinorrhea.  Mouth/Throat: Oropharynx is clear and moist and mucous membranes are normal. No dental abscesses or uvula swelling.  Eyes: Conjunctivae and EOM are normal. Pupils are equal, round, and reactive to light.  Neck: Normal range of motion and full passive range of motion without pain. Neck supple.  Cardiovascular: Normal rate, regular rhythm and normal heart sounds.  Exam reveals no gallop and no friction rub.   No murmur heard. Pulmonary/Chest: Effort normal and breath sounds normal. No respiratory distress. He has no wheezes. He has no rhonchi. He has no rales. He exhibits no tenderness and no crepitus.  Abdominal: Soft. Normal appearance and bowel sounds are normal. He exhibits no distension. There is tenderness. There is no rebound and no guarding.  Patient has some mild tenderness in his left abdomen without guarding or rebound  Genitourinary:  Patient has some diffuse tenderness in his left flank area and to percussion.  Musculoskeletal: Normal range of motion. He exhibits no edema or tenderness.  Moves all extremities well.   Neurological: He is alert and oriented to person, place, and time. He has normal strength. No cranial nerve deficit.  Skin: Skin is warm, dry and intact. No rash noted. No erythema. No pallor.  Psychiatric: He has a normal mood and affect. His speech is normal and behavior is normal. His mood appears not anxious.  Nursing note and vitals reviewed.   ED Course  Procedures (including critical care time)  Medications  ketorolac (TORADOL) injection 60 mg (not administered)  cyclobenzaprine (FLEXERIL) tablet 10 mg (not administered)    We discussed discharge planning. Patient states ER he has Flomax at home but he has not been taking.  Review of the [place] database  shows patient has gotten 5 narcotic prescriptions between November 16 and February 1 all from our emergency department.   Labs Review Results for orders placed or performed during the hospital encounter of 06/18/14  Urinalysis, Routine w reflex microscopic  Result Value Ref Range   Color, Urine YELLOW YELLOW   APPearance CLEAR CLEAR   Specific Gravity, Urine >1.030 (H) 1.005 - 1.030   pH 5.5 5.0 - 8.0   Glucose, UA NEGATIVE NEGATIVE mg/dL   Hgb urine dipstick SMALL (A) NEGATIVE   Bilirubin Urine NEGATIVE NEGATIVE   Ketones, ur NEGATIVE NEGATIVE mg/dL   Protein, ur TRACE (A) NEGATIVE mg/dL   Urobilinogen, UA 0.2 0.0 - 1.0 mg/dL   Nitrite NEGATIVE NEGATIVE   Leukocytes, UA TRACE (A) NEGATIVE  Urine microscopic-add on  Result Value Ref Range   Squamous Epithelial / LPF FEW (A) RARE   WBC, UA 3-6 <3 WBC/hpf  RBC / HPF 3-6 <3 RBC/hpf   Bacteria, UA FEW (A) RARE       Imaging Review No results found.   EKG Interpretation None      MDM   patient has a history of nephrolithiasis presents today with left flank pain. However patient does not appear to be having a kidney stone. He is very calm and in no distress in his room. He'll be treated for musculoskeletal back pain. He has had multiple ED visits in the past 6 months for the same.    Final diagnoses:  Left flank pain    New Prescriptions   CYCLOBENZAPRINE (FLEXERIL) 10 MG TABLET    Take 1 tablet (10 mg total) by mouth 3 (three) times daily as needed (muscle soreness).   NAPROXEN (NAPROSYN) 500 MG TABLET    Take 1 tablet (500 mg total) by mouth 2 (two) times daily.    Plan discharge  Devoria Albe, MD, Concha Pyo, MD 06/19/14 [phone]

## 2014-06-19 NOTE — Discharge Instructions (Signed)
Use ice and heat over your painful area in your left back.  Take the medications as prescribed. You need to get a primary care doctor ([facility] [place]) or a urologist to manage your chronic kidney stone problems.

## 2014-09-10 ENCOUNTER — Emergency Department (HOSPITAL_COMMUNITY)
Admission: EM | Admit: 2014-09-10 | Discharge: 2014-09-10 | Disposition: A | Discharge status: Home / Self Care | Payer: PRIVATE HEALTH INSURANCE | Attending: Emergency Medicine | Admitting: Emergency Medicine

## 2014-09-10 ENCOUNTER — Encounter (HOSPITAL_COMMUNITY): Payer: Self-pay

## 2014-09-10 DIAGNOSIS — Z87442 Personal history of urinary calculi: Secondary | ICD-10-CM | POA: Insufficient documentation

## 2014-09-10 DIAGNOSIS — Z720 Tobacco use: Secondary | ICD-10-CM | POA: Insufficient documentation

## 2014-09-10 DIAGNOSIS — Z79899 Other long term (current) drug therapy: Secondary | ICD-10-CM | POA: Insufficient documentation

## 2014-09-10 DIAGNOSIS — J209 Acute bronchitis, unspecified: Secondary | ICD-10-CM | POA: Insufficient documentation

## 2014-09-10 DIAGNOSIS — Z791 Long term (current) use of non-steroidal anti-inflammatories (NSAID): Secondary | ICD-10-CM | POA: Insufficient documentation

## 2014-09-10 DIAGNOSIS — J40 Bronchitis, not specified as acute or chronic: Secondary | ICD-10-CM

## 2014-09-10 DIAGNOSIS — G8929 Other chronic pain: Secondary | ICD-10-CM | POA: Insufficient documentation

## 2014-09-10 LAB — RAPID STREP SCREEN (MED CTR MEBANE ONLY): Streptococcus, Group A Screen (Direct): NEGATIVE

## 2014-09-10 MED ORDER — HYDROCODONE-HOMATROPINE 5-1.5 MG/5ML PO SYRP
5.0000 mL | ORAL_SOLUTION | Freq: Four times a day (QID) | ORAL | Status: DC | PRN
Start: 2014-09-10 — End: 2014-09-18

## 2014-09-10 MED ORDER — DOXYCYCLINE HYCLATE 100 MG PO TABS
100.0000 mg | ORAL_TABLET | Freq: Once | ORAL | Status: AC
  Administered 2014-09-10: 100 mg via ORAL
  Filled 2014-09-10: qty 1

## 2014-09-10 MED ORDER — PREDNISONE 10 MG PO TABS
60.0000 mg | ORAL_TABLET | Freq: Once | ORAL | Status: AC
  Administered 2014-09-10: 60 mg via ORAL
  Filled 2014-09-10 (×2): qty 1

## 2014-09-10 MED ORDER — DOXYCYCLINE HYCLATE 100 MG PO CAPS: 100.0000 mg | ORAL_CAPSULE | Freq: Two times a day (BID) | ORAL | Status: DC

## 2014-09-10 MED ORDER — ALBUTEROL SULFATE HFA 108 (90 BASE) MCG/ACT IN AERS
2.0000 | INHALATION_SPRAY | Freq: Once | RESPIRATORY_TRACT | Status: AC
  Administered 2014-09-10: 2 via RESPIRATORY_TRACT
  Filled 2014-09-10: qty 6.7

## 2014-09-10 MED ORDER — PREDNISONE 10 MG PO TABS: ORAL_TABLET | ORAL | Status: DC

## 2014-09-10 NOTE — ED Provider Notes (Signed)
CSN: [id number]     Arrival date & time 09/10/14  1437 History  This chart was scribed for non-physician practitioner, Ivery Quale, PA-C working with Mancel Bale, MD by Placido Sou, ED scribe. This patient was seen in room APFT23/APFT23 and the patient's care was started at 4:03 PM.  Chief Complaint  Patient presents with  . Cough   Patient is a 29 y.o. male presenting with cough. The history is provided by the patient. No language interpreter was used.  Cough Cough characteristics:  Productive Sputum characteristics:  Nondescript Severity:  Moderate Onset quality:  Sudden Duration:  1 week Timing:  Constant Progression:  Worsening Chronicity:  New Smoker: yes   Associated symptoms: rhinorrhea, sinus congestion and sore throat     HPI Comments: Dale Martinez is a 29 y.o. male, with a hx of smoking, who presents to the Emergency Department complaining of a moderate productive cough with onset 1 week ago. Pt notes that his symptoms began with a mild cough last week that worsened and now includes an associated, moderate, sore throat and sinus congestion. Pt denies a hx of asthma or being hospitalized for pulmonary issues. He denies any other associated symptoms.   Past Medical History  Diagnosis Date  . Strep throat   . Kidney stone   . Chronic flank pain    History reviewed. No pertinent past surgical history. Family History  Problem Relation Age of Onset  . Diabetes Father    History  Substance Use Topics  . Smoking status: Current Every Day Smoker -- 1.00 packs/day    Types: Cigarettes  . Smokeless tobacco: Never Used  . Alcohol Use: No    Review of Systems  HENT: Positive for congestion, rhinorrhea and sore throat.   Respiratory: Positive for cough.   All other systems reviewed and are negative.  Allergies  Review of patient's allergies indicates no known allergies.  Home Medications   Prior to Admission medications   Medication Sig Start Date End Date  Taking? Authorizing Provider  Aspirin-Salicylamide-Caffeine (BC HEADACHE POWDER PO) Take 2 packets by mouth daily as needed (pain).    Historical Provider, MD  cyclobenzaprine (FLEXERIL) 10 MG tablet Take 1 tablet (10 mg total) by mouth 3 (three) times daily as needed (muscle soreness). 06/19/14   Devoria Albe, MD  dexamethasone (DECADRON) 4 MG tablet 1 po bid with food Patient not taking: Reported on 01/16/2014 12/05/13   Ivery Quale, PA-C  diclofenac (VOLTAREN) 75 MG EC tablet Take 1 tablet (75 mg total) by mouth 2 (two) times daily. Patient not taking: Reported on 01/16/2014 12/05/13   Ivery Quale, PA-C  etodolac (LODINE) 400 MG tablet Take 1 tablet (400 mg total) by mouth 2 (two) times daily as needed for moderate pain. 05/19/14   Gilda Crease, MD  HYDROcodone-acetaminophen (NORCO/VICODIN) 5-325 MG per tablet 1 or 2 tabs PO q6 hours prn pain 02/26/14   Samuel Jester, DO  ibuprofen (ADVIL,MOTRIN) 800 MG tablet Take 1 tablet (800 mg total) by mouth 3 (three) times daily. 03/12/14   Eber Hong, MD  methocarbamol (ROBAXIN) 500 MG tablet Take 2 tablets (1,000 mg total) by mouth 4 (four) times daily as needed for muscle spasms (muscle spasm/pain). 02/26/14   Samuel Jester, DO  naproxen (NAPROSYN) 500 MG tablet Take 1 tablet (500 mg total) by mouth 2 (two) times daily. 06/19/14   Devoria Albe, MD  ondansetron (ZOFRAN) 4 MG tablet Take 1 tablet (4 mg total) by mouth every 8 (eight) hours as  needed for nausea or vomiting. Patient not taking: Reported on 01/16/2014 12/28/13   Samuel Jester, DO  oxyCODONE-acetaminophen (PERCOCET) 5-325 MG per tablet Take 1 tablet by mouth every 4 (four) hours as needed. 03/12/14   Eber Hong, MD  promethazine (PHENERGAN) 25 MG tablet Take 1 tablet (25 mg total) by mouth every 6 (six) hours as needed for nausea or vomiting. 03/12/14   Eber Hong, MD  tamsulosin (FLOMAX) 0.4 MG CAPS capsule Take 1 capsule (0.4 mg total) by mouth 2 (two) times daily. 03/12/14   Eber Hong, MD   BP 134/82 mmHg  Pulse 103  Temp(Src) 98.3 F (36.8 C) (Oral)  Resp 18  Ht 5\' 11"  (1.803 m)  Wt 200 lb (90.719 kg)  BMI 27.91 kg/m2  SpO2 98% Physical Exam  Constitutional: He is oriented to person, place, and time. He appears well-developed and well-nourished. No distress.  HENT:  Head: Normocephalic and atraumatic.  Mouth/Throat: Oropharynx is clear and moist.  Airway is patent; no abscess or exudate in posterior pharynx  Eyes: Conjunctivae and EOM are normal. Pupils are equal, round, and reactive to light.  Neck: Normal range of motion. Neck supple. No tracheal deviation present.  Neck FROM with no rigidity   Cardiovascular: Normal rate.   Pulmonary/Chest: No respiratory distress. He has wheezes.  Bilateral wheezes and rhonchi; pt speaks in complete sentences  Abdominal: Soft.  Musculoskeletal: Normal range of motion.  Cap refill less than 2 seconds; no edema of lower extremities  Neurological: He is alert and oriented to person, place, and time.  Skin: Skin is warm and dry.  Psychiatric: He has a normal mood and affect. His behavior is normal.  Nursing note and vitals reviewed.  ED Course  Procedures  DIAGNOSTIC STUDIES: Oxygen Saturation is 98% on RA, normal by my interpretation.    COORDINATION OF CARE: 4:07 PM Discussed treatment plan with pt at bedside and pt agreed to plan.  Labs Review Labs Reviewed  RAPID STREP SCREEN (NOT AT [facility])  CULTURE, GROUP A STREP    Imaging Review No results found.   EKG Interpretation None      MDM  Vital signs reviewed. Pulse oximetry is 98% on room air. Within normal limits by my interpretation. The examination suggests bronchitis and upper respiratory infection. The patient will be treated with doxycycline (smoker) Decadron, albuterol, and Hycodan cough medication. The patient will return to the emergency department or see his primary physician if any changes or problems.    Final diagnoses:  None    *I  have reviewed nursing notes, vital signs, and all appropriate lab and imaging results for this patient.**  **I personally performed the services described in this documentation, which was scribed in my presence. The recorded information has been reviewed and is accurate.Ivery Quale, PA-C 09/10/14 1635  Mancel Bale, MD 09/10/14 [phone]

## 2014-09-10 NOTE — ED Notes (Signed)
Nasal congestion, productive cough, sore throat x1 week

## 2014-09-10 NOTE — ED Notes (Signed)
Respiratory at the bedside to educated on inhaler. Patient given discharge instruction, verbalized understand. Patient ambulatory out of the department.

## 2014-09-10 NOTE — Discharge Instructions (Signed)
Please increase fluids. Please use albuterol 2 puffs every 4 hours, prednisone taper as prescribed, doxycycline 2 times daily until all taken. May use Hycodan cough medication every 6 hours if needed. Hycodan may cause drowsiness, please do not drink alcohol, operated vehicle, operating machinery, or participate in activities requiring concentration when taking this medication. Viral Infections A virus is a type of germ. Viruses can cause:  Minor sore throats.  Aches and pains.  Headaches.  Runny nose.  Rashes.  Watery eyes.  Tiredness.  Coughs.  Loss of appetite.  Feeling sick to your stomach (nausea).  Throwing up (vomiting).  Watery poop (diarrhea). HOME CARE   Only take medicines as told by your doctor.  Drink enough water and fluids to keep your pee (urine) clear or pale yellow. Sports drinks are a good choice.  Get plenty of rest and eat healthy. Soups and broths with crackers or rice are fine. GET HELP RIGHT AWAY IF:   You have a very bad headache.  You have shortness of breath.  You have chest pain or neck pain.  You have an unusual rash.  You cannot stop throwing up.  You have watery poop that does not stop.  You cannot keep fluids down.  You or your child has a temperature by mouth above 102 F (38.9 C), not controlled by medicine.  Your baby is older than 3 months with a rectal temperature of 102 F (38.9 C) or higher.  Your baby is 75 months old or younger with a rectal temperature of 100.4 F (38 C) or higher. MAKE SURE YOU:   Understand these instructions.  Will watch this condition.  Will get help right away if you are not doing well or get worse. Document Released: 01/09/2008 Document Revised: 04/20/2011 Document Reviewed: 06/03/2010 [facility] Patient Information 2015 [place], [place]. This information is not intended to replace advice given to you by your health care provider. Make sure you discuss any questions you have with your health  care provider.

## 2014-09-12 LAB — CULTURE, GROUP A STREP: Strep A Culture: NEGATIVE

## 2014-09-18 ENCOUNTER — Emergency Department (HOSPITAL_COMMUNITY)
Admission: EM | Admit: 2014-09-18 | Discharge: 2014-09-18 | Disposition: A | Discharge status: Home / Self Care | Payer: PRIVATE HEALTH INSURANCE | Attending: Emergency Medicine | Admitting: Emergency Medicine

## 2014-09-18 ENCOUNTER — Encounter (HOSPITAL_COMMUNITY): Payer: Self-pay | Admitting: *Deleted

## 2014-09-18 DIAGNOSIS — Z720 Tobacco use: Secondary | ICD-10-CM | POA: Insufficient documentation

## 2014-09-18 DIAGNOSIS — R197 Diarrhea, unspecified: Secondary | ICD-10-CM | POA: Insufficient documentation

## 2014-09-18 DIAGNOSIS — M791 Myalgia: Secondary | ICD-10-CM | POA: Diagnosis not present

## 2014-09-18 DIAGNOSIS — M7918 Myalgia, other site: Secondary | ICD-10-CM

## 2014-09-18 DIAGNOSIS — G8929 Other chronic pain: Secondary | ICD-10-CM | POA: Diagnosis not present

## 2014-09-18 DIAGNOSIS — R109 Unspecified abdominal pain: Secondary | ICD-10-CM | POA: Insufficient documentation

## 2014-09-18 DIAGNOSIS — R110 Nausea: Secondary | ICD-10-CM | POA: Insufficient documentation

## 2014-09-18 DIAGNOSIS — Z8709 Personal history of other diseases of the respiratory system: Secondary | ICD-10-CM | POA: Insufficient documentation

## 2014-09-18 DIAGNOSIS — Z87442 Personal history of urinary calculi: Secondary | ICD-10-CM | POA: Insufficient documentation

## 2014-09-18 LAB — URINALYSIS, ROUTINE W REFLEX MICROSCOPIC
Bilirubin Urine: NEGATIVE
Glucose, UA: NEGATIVE mg/dL
Ketones, ur: NEGATIVE mg/dL
Leukocytes, UA: NEGATIVE
NITRITE: NEGATIVE
PROTEIN: NEGATIVE mg/dL
Specific Gravity, Urine: >1.03 — ABNORMAL HIGH (ref 1.005–1.030)
UROBILINOGEN UA: 0.2 mg/dL (ref 0.0–1.0)
pH: 5.5 (ref 5.0–8.0)

## 2014-09-18 LAB — URINE MICROSCOPIC-ADD ON

## 2014-09-18 MED ORDER — KETOROLAC TROMETHAMINE 30 MG/ML IJ SOLN
60.0000 mg | Freq: Once | INTRAMUSCULAR | Status: AC
  Administered 2014-09-18: 60 mg via INTRAMUSCULAR
  Filled 2014-09-18: qty 2

## 2014-09-18 MED ORDER — CYCLOBENZAPRINE HCL 5 MG PO TABS: 5.0000 mg | ORAL_TABLET | Freq: Three times a day (TID) | ORAL | Status: DC | PRN

## 2014-09-18 MED ORDER — CYCLOBENZAPRINE HCL 10 MG PO TABS
5.0000 mg | ORAL_TABLET | Freq: Once | ORAL | Status: AC
  Administered 2014-09-18: 5 mg via ORAL
  Filled 2014-09-18: qty 1

## 2014-09-18 MED ORDER — NAPROXEN 500 MG PO TABS: ORAL_TABLET | ORAL | Status: DC

## 2014-09-18 NOTE — ED Provider Notes (Signed)
CSN: [id number]     Arrival date & time 09/18/14  4098 History   First MD Initiated Contact with Patient 09/18/14 [phone]     Chief Complaint  Patient presents with  . 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 He states about 9:00 this evening he started having a discomfort in his left lower quadrant. He states now he has pain radiating into his left flank and into his left groin. He has had nausea without vomiting. He states about 10 PM he had 2 episodes of loose diarrhea stools. He denies fever. He denies having hematuria. He states bending over makes the pain worse, laying down makes the pain feel better. He describes the pain as sharp. He thinks he may be having a kidney stone.  PCP none  Past Medical History  Diagnosis Date  . Strep throat   . Kidney stone   . Chronic flank pain    History reviewed. No pertinent past surgical history. Family History  Problem Relation Age of Onset  . Diabetes Father    History  Substance Use Topics  . Smoking status: Current Every Day Smoker -- 1.00 packs/day    Types: Cigarettes  . Smokeless tobacco: Never Used  . Alcohol Use: No  employed  Review of Systems  All other systems reviewed and are negative.     Allergies  Review of patient's allergies indicates no known allergies.  Home Medications   none BP 137/92 mmHg  Pulse 88  Temp(Src) 98.1 F (36.7 C) (Oral)  Resp 18  Ht 5\' 11"  (1.803 m)  Wt 205 lb (92.987 kg)  BMI 28.60 kg/m2  SpO2 98%  Vital signs normal   Physical Exam  Constitutional: He is oriented to person, place, and time. He appears well-developed and well-nourished.  Non-toxic appearance. He does not appear ill. No distress.  Patient is calmly lying on a stretcher in no distress. His color is normal.  HENT:  Head: Normocephalic and atraumatic.  Right Ear: External ear normal.  Left Ear:  External ear normal.  Nose: Nose normal. No mucosal edema or rhinorrhea.  Mouth/Throat: Oropharynx is clear and moist and mucous membranes are normal. No dental abscesses or uvula swelling.  Eyes: Conjunctivae and EOM are normal. Pupils are equal, round, and reactive to light.  Neck: Normal range of motion and full passive range of motion without pain. Neck supple.  Cardiovascular: Normal rate, regular rhythm and normal heart sounds.  Exam reveals no gallop and no friction rub.   No murmur heard. Pulmonary/Chest: Effort normal and breath sounds normal. No respiratory distress. He has no wheezes. He has no rhonchi. He has no rales. He exhibits no tenderness and no crepitus.  Abdominal: Soft. Normal appearance and bowel sounds are normal. He exhibits no distension. There is no tenderness. There is no rebound and no guarding.  Patient has mild fused left-sided tenderness  Genitourinary:  Patient has left CVA tenderness  Musculoskeletal: Normal range of motion. He exhibits no edema or tenderness.  Moves all extremities well.   Neurological: He is alert and oriented to person, place, and time. He has normal strength. No cranial nerve deficit.  Skin: Skin is warm, dry and intact. No rash noted. No erythema. No pallor.  Psychiatric: He has a normal mood and affect. His speech is normal and behavior is normal. His mood appears not anxious.  Nursing note and vitals reviewed.   ED Course  Procedures (including  critical care time) Medications  ketorolac (TORADOL) 30 MG/ML injection 60 mg (60 mg Intramuscular Given 09/18/14 0412)  cyclobenzaprine (FLEXERIL) tablet 5 mg (5 mg Oral Given 09/18/14 0455)   Patient does not appear to be suffering from a kidney stone. He was given Toradol IM for his presumed muscle skeletal pain. Patient comes to the ED on a frequent basis for flank pain. He has had 6 ED visits in the past 6 months most for similar complaints as tonight. He has been coming to the ED for years  with flank pain.      February 15, 2014 Study Result    3 CLINICAL DATA: Left lower quadrant pain, vomiting  EXAM: CT ABDOMEN AND PELVIS WITH CONTRAST  TECHNIQUE: Multidetector CT imaging of the abdomen and pelvis was performed using the standard protocol following bolus administration of intravenous contrast.  CONTRAST: OMNIPAQUE IOHEXOL 300 MG/ML SOLN  COMPARISON: 12/28/2013  FINDINGS: Lung bases are unremarkable. Sagittal images of the spine are unremarkable. There is mild hepatic fatty infiltration. No focal hepatic mass. No calcified gallstones are noted within gallbladder.  The pancreas, spleen and adrenal glands are unremarkable. Kidneys are symmetrical in size and enhancement. No hydronephrosis or hydroureter. There is nonobstructive calculus in midpole of the left kidney measures 2.8 mm. Nonobstructive punctate calculus lower pole of the left kidney measures 2 mm. Tiny nonobstructive punctate calculus lower pole of the right kidney measures 1.5 mm.  No calcified ureteral calculi are noted. A left para-aortic retroperitoneal lymph node measures 1.3 by 0.8 cm stable in size in appearance from prior exam.  Abdominal aorta is unremarkable.  No small bowel obstruction. No ascites or free air. No adenopathy. Normal retrocecal appendix. The terminal ileum is unremarkable. No pericecal inflammation.  Bilateral distal ureter is unremarkable. The urinary bladder is unremarkable. Prostate gland and seminal vesicles are unremarkable. No colonic obstruction.  IMPRESSION: 1. No acute inflammatory process within abdomen or pelvis. 2. Again noted bilateral nonobstructive nephrolithiasis. No hydronephrosis or hydroureter. 3. No small bowel obstruction. 4. Normal retrocecal appendix. No pericecal inflammation. 5. No calcified calculi are noted within urinary bladder.   Electronically Signed  By: Natasha Mead M.D.  On: 02/15/2014 11:07       Labs Review Results for orders placed or performed during the hospital encounter of 09/18/14  Urinalysis, Routine w reflex microscopic (not at [facility])  Result Value Ref Range   Color, Urine YELLOW YELLOW   APPearance CLEAR CLEAR   Specific Gravity, Urine >1.030 (H) 1.005 - 1.030   pH 5.5 5.0 - 8.0   Glucose, UA NEGATIVE NEGATIVE mg/dL   Hgb urine dipstick SMALL (A) NEGATIVE   Bilirubin Urine NEGATIVE NEGATIVE   Ketones, ur NEGATIVE NEGATIVE mg/dL   Protein, ur NEGATIVE NEGATIVE mg/dL   Urobilinogen, UA 0.2 0.0 - 1.0 mg/dL   Nitrite NEGATIVE NEGATIVE   Leukocytes, UA NEGATIVE NEGATIVE  Urine microscopic-add on  Result Value Ref Range   RBC / HPF 0-2 <3 RBC/hpf   Laboratory interpretation all normal except persistent microscopic hematuria    Imaging Review No results found.   EKG Interpretation None      MDM   Final diagnoses:  Left flank pain  Musculoskeletal pain    New Prescriptions   CYCLOBENZAPRINE (FLEXERIL) 5 MG TABLET    Take 1 tablet (5 mg total) by mouth 3 (three) times daily as needed.   NAPROXEN (NAPROSYN) 500 MG TABLET    Take 1 po BID with food prn pain  Plan discharge  Devoria Albe, MD, FACEP]     Devoria Albe, MD 09/18/14 [phone]

## 2014-09-18 NOTE — Discharge Instructions (Signed)
Drink plenty of fluids. Take the medication as prescribed. Try ice and heat on the painful areas. You need to get a primary care doctor. You can call the doctors offices in town to see who is accepting new patients.                                                                                                                                                                                                                                                                                                                                                                                                                                                                                                                             [name]

## 2014-09-18 NOTE — ED Notes (Signed)
Pt c/o left sided flank pain and groin pain since 9pm. Pt has hx of kidney stones

## 2014-10-06 ENCOUNTER — Emergency Department (HOSPITAL_COMMUNITY): Payer: PRIVATE HEALTH INSURANCE

## 2014-10-06 ENCOUNTER — Encounter (HOSPITAL_COMMUNITY): Payer: Self-pay | Admitting: Emergency Medicine

## 2014-10-06 ENCOUNTER — Emergency Department (HOSPITAL_COMMUNITY)
Admission: EM | Admit: 2014-10-06 | Discharge: 2014-10-07 | Disposition: A | Discharge status: Home / Self Care | Payer: PRIVATE HEALTH INSURANCE | Attending: Emergency Medicine | Admitting: Emergency Medicine

## 2014-10-06 DIAGNOSIS — Z8709 Personal history of other diseases of the respiratory system: Secondary | ICD-10-CM | POA: Insufficient documentation

## 2014-10-06 DIAGNOSIS — G8929 Other chronic pain: Secondary | ICD-10-CM | POA: Diagnosis not present

## 2014-10-06 DIAGNOSIS — Z720 Tobacco use: Secondary | ICD-10-CM | POA: Diagnosis not present

## 2014-10-06 DIAGNOSIS — N390 Urinary tract infection, site not specified: Secondary | ICD-10-CM | POA: Insufficient documentation

## 2014-10-06 DIAGNOSIS — N200 Calculus of kidney: Secondary | ICD-10-CM | POA: Diagnosis not present

## 2014-10-06 DIAGNOSIS — R1032 Left lower quadrant pain: Secondary | ICD-10-CM | POA: Diagnosis present

## 2014-10-06 DIAGNOSIS — Z7982 Long term (current) use of aspirin: Secondary | ICD-10-CM | POA: Diagnosis not present

## 2014-10-06 LAB — URINALYSIS, ROUTINE W REFLEX MICROSCOPIC
GLUCOSE, UA: NEGATIVE mg/dL
Nitrite: POSITIVE — AB
PROTEIN: 100 mg/dL — AB
Specific Gravity, Urine: >1.03 — ABNORMAL HIGH (ref 1.005–1.030)
Urobilinogen, UA: 1 mg/dL (ref 0.0–1.0)
pH: 6.5 (ref 5.0–8.0)

## 2014-10-06 LAB — URINE MICROSCOPIC-ADD ON

## 2014-10-06 MED ORDER — ONDANSETRON HCL 4 MG PO TABS
ORAL_TABLET | ORAL | Status: AC
  Administered 2014-10-06: 4 mg
  Filled 2014-10-06: qty 1

## 2014-10-06 MED ORDER — OXYCODONE-ACETAMINOPHEN 5-325 MG PO TABS: 1.0000 | ORAL_TABLET | Freq: Four times a day (QID) | ORAL | Status: DC | PRN

## 2014-10-06 MED ORDER — CIPROFLOXACIN HCL 500 MG PO TABS: 500.0000 mg | ORAL_TABLET | Freq: Two times a day (BID) | ORAL | Status: DC

## 2014-10-06 MED ORDER — CIPROFLOXACIN HCL 250 MG PO TABS
500.0000 mg | ORAL_TABLET | Freq: Once | ORAL | Status: AC
  Administered 2014-10-06: 500 mg via ORAL
  Filled 2014-10-06: qty 2

## 2014-10-06 MED ORDER — HYDROMORPHONE HCL 1 MG/ML IJ SOLN
1.0000 mg | Freq: Once | INTRAMUSCULAR | Status: AC
  Administered 2014-10-06: 1 mg via INTRAMUSCULAR
  Filled 2014-10-06: qty 1

## 2014-10-06 MED ORDER — KETOROLAC TROMETHAMINE 60 MG/2ML IM SOLN
60.0000 mg | Freq: Once | INTRAMUSCULAR | Status: AC
  Administered 2014-10-06: 60 mg via INTRAMUSCULAR
  Filled 2014-10-06: qty 2

## 2014-10-06 NOTE — ED Provider Notes (Signed)
CSN: [id number]     Arrival date & time 10/06/14  2042 History  This chart was scribed for Donnetta Hutching, MD by Phillis Haggis, ED Scribe. This patient was seen in room APA09/APA09 and patient care was started at 9:01 PM.     Chief Complaint  Patient presents with  . Groin Pain   The history is provided by the patient. No language interpreter was used.  HPI Comments: Dale Martinez is a 29 y.o. Male with a hx of kidney stones and chronic flank pain who presents to the Emergency Department complaining of gradually worsening LLQ abdominal pain radiating to left testicle and hematuria onset earlier today. He states that the pain started when he woke up and states that it hurts "pretty dang bad." He states that he went to give a urine specimen 30 minutes ago and his urine was dark red. He reports hx of approximately 15 kidney stones; states that some have hurt intensely but others he has been able to pass, his last being one week ago. States that he had imaging done recently that showed multiple kidney stones present and he believes he's passing them now. He states that he takes Flomax and aspirin to some relief.  Past Medical History  Diagnosis Date  . Strep throat   . Kidney stone   . Chronic flank pain    History reviewed. No pertinent past surgical history. Family History  Problem Relation Age of Onset  . Diabetes Father    Social History  Substance Use Topics  . Smoking status: Current Every Day Smoker -- 1.00 packs/day    Types: Cigarettes  . Smokeless tobacco: Never Used  . Alcohol Use: No    Review of Systems  Genitourinary: Positive for hematuria.       Left groin pain  All other systems reviewed and are negative.   Allergies  Review of patient's allergies indicates no known allergies.  Home Medications   Prior to Admission medications   Medication Sig Start Date End Date Taking? Authorizing Provider  aspirin 325 MG tablet Take 325 mg by mouth as needed for mild pain.    Yes Historical Provider, MD  ciprofloxacin (CIPRO) 500 MG tablet Take 1 tablet (500 mg total) by mouth 2 (two) times daily. 10/06/14   Donnetta Hutching, MD  cyclobenzaprine (FLEXERIL) 5 MG tablet Take 1 tablet (5 mg total) by mouth 3 (three) times daily as needed. 09/18/14   Devoria Albe, MD  doxycycline (VIBRAMYCIN) 100 MG capsule Take 1 capsule (100 mg total) by mouth 2 (two) times daily. 09/10/14   Ivery Quale, PA-C  naproxen (NAPROSYN) 500 MG tablet Take 1 po BID with food prn pain 09/18/14   Devoria Albe, MD  oxyCODONE-acetaminophen (PERCOCET/ROXICET) 5-325 MG per tablet Take 1-2 tablets by mouth every 6 (six) hours as needed. 10/06/14   Donnetta Hutching, MD  predniSONE (DELTASONE) 10 MG tablet 5,4,3,2,1 - take with food 09/10/14   Ivery Quale, PA-C   BP 146/81 mmHg  Pulse 93  Temp(Src) 98.3 F (36.8 C) (Oral)  Resp 20  Ht 5\' 11"  (1.803 m)  Wt 205 lb (92.987 kg)  BMI 28.60 kg/m2  SpO2 97%  Physical Exam  Constitutional: He is oriented to person, place, and time. He appears well-developed and well-nourished.  HENT:  Head: Normocephalic and atraumatic.  Eyes: Conjunctivae and EOM are normal. Pupils are equal, round, and reactive to light.  Neck: Normal range of motion. Neck supple.  Cardiovascular: Normal rate and regular rhythm.  Pulmonary/Chest: Effort normal and breath sounds normal.  Abdominal: Soft. Bowel sounds are normal.  Genitourinary:  Minimal left inguinal and left testicular tenderness  Musculoskeletal: Normal range of motion.  Neurological: He is alert and oriented to person, place, and time.  Skin: Skin is warm and dry.  Psychiatric: He has a normal mood and affect. His behavior is normal.  Nursing note and vitals reviewed.   ED Course  Procedures (including critical care time) DIAGNOSTIC STUDIES: Oxygen Saturation is 97% on RA, normal by my interpretation.    COORDINATION OF CARE: 9:05 PM-Discussed treatment plan which includes x-ray and UA  with pt at bedside and pt agreed to  plan.   Labs Review Labs Reviewed  URINALYSIS, ROUTINE W REFLEX MICROSCOPIC (NOT AT [facility]) - Abnormal; Notable for the following:    Color, Urine BROWN (*)    APPearance CLOUDY (*)    Specific Gravity, Urine >1.030 (*)    Hgb urine dipstick LARGE (*)    Bilirubin Urine MODERATE (*)    Ketones, ur TRACE (*)    Protein, ur 100 (*)    Nitrite POSITIVE (*)    Leukocytes, UA TRACE (*)    All other components within normal limits  URINE MICROSCOPIC-ADD ON - Abnormal; Notable for the following:    Bacteria, UA MANY (*)    All other components within normal limits  URINE CULTURE    Imaging Review Dg Abd 1 View  10/06/2014   CLINICAL DATA:  Left-sided abdominal pain. Left inguinal pain, rule out stone.  EXAM: ABDOMEN - 1 VIEW  COMPARISON:  CT 02/15/2014  FINDINGS: There is a 4 mm calcification in the left mid abdomen at the level of L3. The known additional nonobstructing nephrolithiasis are not seen radiographically. There is a pelvic phlebolith on the left that is unchanged from prior CT. Normal bowel gas pattern with moderate stool in the right colon. No osseous abnormality.  IMPRESSION: A 4 mm calcification in the left mid abdomen at the level 3, may reflect a stone in the left proximal ureter.   Electronically Signed   By: Rubye Oaks M.D.   On: 10/06/2014 23:27      EKG Interpretation None      MDM   Final diagnoses:  Kidney stone on left side  UTI (lower urinary tract infection)   History and physical suggestive of kidney stone. KUB shows a 4 mm calcification in the left mid abdomen.  Urinalysis shows evidence of infection. Will treat pain. Discharge medications Cipro 500 mg and Percocet.  Patient encouraged to get urology follow-up  I personally performed the services described in this documentation, which was scribed in my presence. The recorded information has been reviewed and is accurate.    Donnetta Hutching, MD 10/07/14 0000

## 2014-10-06 NOTE — ED Notes (Signed)
Pt reports L sided groin pain that started today. Hx of kidney stones. Pt reports hematuria.

## 2014-10-06 NOTE — Discharge Instructions (Signed)
You have a kidney stone and a urinary tract infection. Prescription for antibiotic and pain medicine. Strongly recommend follow-up with urologist. Phone number given.

## 2014-10-09 LAB — URINE CULTURE

## 2014-10-12 ENCOUNTER — Encounter (HOSPITAL_COMMUNITY): Payer: Self-pay | Admitting: Emergency Medicine

## 2014-10-12 ENCOUNTER — Emergency Department (HOSPITAL_COMMUNITY)
Admission: EM | Admit: 2014-10-12 | Discharge: 2014-10-12 | Disposition: A | Discharge status: Home / Self Care | Payer: PRIVATE HEALTH INSURANCE | Attending: Physician Assistant | Admitting: Physician Assistant

## 2014-10-12 ENCOUNTER — Emergency Department (HOSPITAL_COMMUNITY): Payer: PRIVATE HEALTH INSURANCE

## 2014-10-12 DIAGNOSIS — Z8709 Personal history of other diseases of the respiratory system: Secondary | ICD-10-CM | POA: Diagnosis not present

## 2014-10-12 DIAGNOSIS — R319 Hematuria, unspecified: Secondary | ICD-10-CM | POA: Insufficient documentation

## 2014-10-12 DIAGNOSIS — Z7982 Long term (current) use of aspirin: Secondary | ICD-10-CM | POA: Diagnosis not present

## 2014-10-12 DIAGNOSIS — Z720 Tobacco use: Secondary | ICD-10-CM | POA: Diagnosis not present

## 2014-10-12 DIAGNOSIS — Z792 Long term (current) use of antibiotics: Secondary | ICD-10-CM | POA: Diagnosis not present

## 2014-10-12 DIAGNOSIS — G8929 Other chronic pain: Secondary | ICD-10-CM | POA: Insufficient documentation

## 2014-10-12 DIAGNOSIS — M545 Low back pain: Secondary | ICD-10-CM | POA: Insufficient documentation

## 2014-10-12 DIAGNOSIS — R300 Dysuria: Secondary | ICD-10-CM | POA: Diagnosis not present

## 2014-10-12 DIAGNOSIS — R109 Unspecified abdominal pain: Secondary | ICD-10-CM | POA: Diagnosis present

## 2014-10-12 DIAGNOSIS — Z87442 Personal history of urinary calculi: Secondary | ICD-10-CM | POA: Diagnosis not present

## 2014-10-12 DIAGNOSIS — R3915 Urgency of urination: Secondary | ICD-10-CM | POA: Diagnosis not present

## 2014-10-12 LAB — CBC WITH DIFFERENTIAL/PLATELET
Basophils Absolute: 0.1 10*3/uL (ref 0.0–0.1)
Basophils Relative: 0 % (ref 0–1)
EOS ABS: 0.4 10*3/uL (ref 0.0–0.7)
EOS PCT: 3 % (ref 0–5)
HCT: 43.4 % (ref 39.0–52.0)
HEMOGLOBIN: 14.5 g/dL (ref 13.0–17.0)
LYMPHS ABS: 2.8 10*3/uL (ref 0.7–4.0)
LYMPHS PCT: 21 % (ref 12–46)
MCH: 30.5 pg (ref 26.0–34.0)
MCHC: 33.4 g/dL (ref 30.0–36.0)
MCV: 91.4 fL (ref 78.0–100.0)
MONOS PCT: 8 % (ref 3–12)
Monocytes Absolute: 1.1 10*3/uL — ABNORMAL HIGH (ref 0.1–1.0)
Neutro Abs: 9.1 10*3/uL — ABNORMAL HIGH (ref 1.7–7.7)
Neutrophils Relative %: 68 % (ref 43–77)
PLATELETS: 220 10*3/uL (ref 150–400)
RBC: 4.75 MIL/uL (ref 4.22–5.81)
RDW: 13.2 % (ref 11.5–15.5)
WBC: 13.3 10*3/uL — ABNORMAL HIGH (ref 4.0–10.5)

## 2014-10-12 LAB — BASIC METABOLIC PANEL
Anion gap: 4 — ABNORMAL LOW (ref 5–15)
BUN: 12 mg/dL (ref 6–20)
CHLORIDE: 110 mmol/L (ref 101–111)
CO2: 26 mmol/L (ref 22–32)
CREATININE: 0.87 mg/dL (ref 0.61–1.24)
Calcium: 8.4 mg/dL — ABNORMAL LOW (ref 8.9–10.3)
GFR calc Af Amer: >60 mL/min (ref >60)
GFR calc non Af Amer: >60 mL/min (ref >60)
Glucose, Bld: 83 mg/dL (ref 65–99)
POTASSIUM: 3.9 mmol/L (ref 3.5–5.1)
Sodium: 140 mmol/L (ref 135–145)

## 2014-10-12 LAB — URINALYSIS, ROUTINE W REFLEX MICROSCOPIC
Glucose, UA: NEGATIVE mg/dL
KETONES UR: NEGATIVE mg/dL
LEUKOCYTES UA: NEGATIVE
NITRITE: NEGATIVE
PROTEIN: 30 mg/dL — AB
Specific Gravity, Urine: >1.03 — ABNORMAL HIGH (ref 1.005–1.030)
UROBILINOGEN UA: 0.2 mg/dL (ref 0.0–1.0)
pH: 6 (ref 5.0–8.0)

## 2014-10-12 LAB — URINE MICROSCOPIC-ADD ON

## 2014-10-12 MED ORDER — SODIUM CHLORIDE 0.9 % IV BOLUS (SEPSIS)
1000.0000 mL | Freq: Once | INTRAVENOUS | Status: AC
  Administered 2014-10-12: 1000 mL via INTRAVENOUS

## 2014-10-12 MED ORDER — OXYCODONE-ACETAMINOPHEN 5-325 MG PO TABS
1.0000 | ORAL_TABLET | Freq: Once | ORAL | Status: AC
  Administered 2014-10-12: 1 via ORAL
  Filled 2014-10-12: qty 1

## 2014-10-12 MED ORDER — OXYCODONE-ACETAMINOPHEN 5-325 MG PO TABS
1.0000 | ORAL_TABLET | Freq: Four times a day (QID) | ORAL | Status: DC | PRN
Start: 2014-10-12 — End: 2014-10-29

## 2014-10-12 MED ORDER — KETOROLAC TROMETHAMINE 30 MG/ML IJ SOLN
30.0000 mg | Freq: Once | INTRAMUSCULAR | Status: AC
  Administered 2014-10-12: 30 mg via INTRAVENOUS
  Filled 2014-10-12: qty 1

## 2014-10-12 NOTE — ED Notes (Signed)
Pt states he was treated for a kidney stone and bladder infection a week ago and pain and symptoms went away. Pt c/o of LT side flank pain, nausea, groin pain, blood in urine, and urinary frequency.

## 2014-10-12 NOTE — ED Provider Notes (Signed)
CSN: [id number]     Arrival date & time 10/12/14  1608 History   First MD Initiated Contact with Patient 10/12/14 1628     Chief Complaint  Patient presents with  . Flank Pain     (Consider location/radiation/quality/duration/timing/severity/associated sxs/prior Treatment) HPI   Patient is a 29 year old male with history of kidney stones and chronic flank pain was recently seen less than 1 week ago here in the emergency department and diagnosed with suspected kidney stones given Percocet and sent home. He is here with the same pain that he was a week ago with pain in his left side rating to his left testicle and hematuria seen today. Patient states it hurts very badly. He states he still has Percocet at home but wanted to come because he had blood his urine. He reports he's had a history around 15 kidney stones but has been unable to follow up with urology.  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  Past Medical History  Diagnosis Date  . Strep throat   . Kidney stone   . Chronic flank pain    History reviewed. No pertinent past surgical history. Family History  Problem Relation Age of Onset  . Diabetes Father    Social History  Substance Use Topics  . Smoking status: Current Every Day Smoker -- 1.00 packs/day    Types: Cigarettes  . Smokeless tobacco: Never Used  . Alcohol Use: No    Review of Systems  Constitutional: Negative for fever and activity change.  HENT: Negative for drooling and hearing loss.   Eyes: Negative for discharge and redness.  Respiratory: Negative for cough and shortness of breath.   Cardiovascular: Negative for chest pain.  Gastrointestinal: Positive for abdominal pain.  Genitourinary: Positive for dysuria, urgency and hematuria. Negative for discharge.  Musculoskeletal: Positive for back pain.  Allergic/Immunologic: Negative for immunocompromised state.  Neurological: Negative for seizures and speech difficulty.   Psychiatric/Behavioral: Negative for behavioral problems and agitation.  All other systems reviewed and are negative.     Allergies  Review of patient's allergies indicates no known allergies.  Home Medications   Prior to Admission medications   Medication Sig Start Date End Date Taking? Authorizing Provider  aspirin 325 MG tablet Take 325 mg by mouth as needed for mild pain.   Yes Historical Provider, MD  ciprofloxacin (CIPRO) 500 MG tablet Take 1 tablet (500 mg total) by mouth 2 (two) times daily. 10/06/14  Yes Donnetta Hutching, MD  oxyCODONE-acetaminophen (PERCOCET/ROXICET) 5-325 MG per tablet Take 1-2 tablets by mouth every 6 (six) hours as needed. 10/06/14  Yes Donnetta Hutching, MD  cyclobenzaprine (FLEXERIL) 5 MG tablet Take 1 tablet (5 mg total) by mouth 3 (three) times daily as needed. Patient not taking: Reported on 10/12/2014 09/18/14   Devoria Albe, MD  doxycycline (VIBRAMYCIN) 100 MG capsule Take 1 capsule (100 mg total) by mouth 2 (two) times daily. Patient not taking: Reported on 10/12/2014 09/10/14   Ivery Quale, PA-C  naproxen (NAPROSYN) 500 MG tablet Take 1 po BID with food prn pain Patient not taking: Reported on 10/12/2014 09/18/14   Devoria Albe, MD  predniSONE (DELTASONE) 10 MG tablet 5,4,3,2,1 - take with food Patient not taking: Reported on 10/12/2014 09/10/14   Ivery Quale, PA-C   BP 139/83 mmHg  Pulse 93  Temp(Src) 98.1 F (36.7 C) (Oral)  Resp 18  Ht 5\' 11"  (1.803 m)  Wt 205 lb (92.987 kg)  BMI 28.60 kg/m2  SpO2 98%  Physical Exam  Constitutional: He is oriented to person, place, and time. He appears well-nourished.  HENT:  Head: Normocephalic.  Mouth/Throat: Oropharynx is clear and moist.  Eyes: Conjunctivae are normal.  Neck: No tracheal deviation present.  Cardiovascular: Normal rate.   Pulmonary/Chest: Effort normal. No stridor. No respiratory distress.  Abdominal: Soft. There is no tenderness. There is no guarding.  Musculoskeletal: Normal range of motion. He exhibits no  edema.  Neurological: He is oriented to person, place, and time. No cranial nerve deficit.  Skin: Skin is warm and dry. No rash noted. He is not diaphoretic.  Psychiatric: He has a normal mood and affect. His behavior is normal.  Nursing note and vitals reviewed.   ED Course  Procedures (including critical care time) Labs Review Labs Reviewed  URINE CULTURE  URINALYSIS, ROUTINE W REFLEX MICROSCOPIC (NOT AT [facility])    Imaging Review No results found. I have personally reviewed and evaluated these images and lab results as part of my medical decision-making.   EKG Interpretation None      MDM   Final diagnoses:  None   patient is a 29 year old male with history of chronic flank pain and kidney stones. Patient seen a week ago for a 4 mm stone found by x-ray. Patient reports his pain got better for a while and then got really worse and he was having more blood in his urine and more pain. Concerned about another stone. We'll get a CT abdomen and pelvis without contrast. Concerned that it could be an obstructive stone. There is no ultrasound here evening.  Patient already has Flomax and Percocet home.  CT shows new stones.  Will again encourage follow up with urology.   Maritssa Haughton Randall An, MD 10/22/14 0700

## 2014-10-12 NOTE — Discharge Instructions (Signed)
You need to follow up with urology!!!! Continue to take flomax.  Flank Pain Flank pain is pain in your side. The flank is the area of your side between your upper belly (abdomen) and your back. Pain in this area can be caused by many different things. HOME CARE Home care and treatment will depend on the cause of your pain.  Rest as told by your doctor.  Drink enough fluids to keep your pee (urine) clear or pale yellow.  Only take medicine as told by your doctor.  Tell your doctor about any changes in your pain.  Follow up with your doctor. GET HELP RIGHT AWAY IF:   Your pain does not get better with medicine.   You have new symptoms or your symptoms get worse.  Your pain gets worse.   You have belly (abdominal) pain.   You are short of breath.   You always feel sick to your stomach (nauseous).   You keep throwing up (vomiting).   You have puffiness (swelling) in your belly.   You feel light-headed or you pass out (faint).   You have blood in your pee.  You have a fever or lasting symptoms for more than 2-3 days.  You have a fever and your symptoms suddenly get worse. MAKE SURE YOU:   Understand these instructions.  Will watch your condition.  Will get help right away if you are not doing well or get worse. Document Released: 11/05/2007 Document Revised: 06/12/2013 Document Reviewed: 09/10/2011 [facility] Patient Information 2015 [place], [place]. This information is not intended to replace advice given to you by your health care provider. Make sure you discuss any questions you have with your health care provider.

## 2014-10-14 LAB — URINE CULTURE: CULTURE: NO GROWTH

## 2014-10-29 ENCOUNTER — Emergency Department (HOSPITAL_COMMUNITY)
Admission: EM | Admit: 2014-10-29 | Discharge: 2014-10-29 | Disposition: A | Discharge status: Home / Self Care | Payer: PRIVATE HEALTH INSURANCE | Attending: Emergency Medicine | Admitting: Emergency Medicine

## 2014-10-29 ENCOUNTER — Encounter (HOSPITAL_COMMUNITY): Payer: Self-pay | Admitting: *Deleted

## 2014-10-29 DIAGNOSIS — R109 Unspecified abdominal pain: Secondary | ICD-10-CM | POA: Diagnosis present

## 2014-10-29 DIAGNOSIS — Z792 Long term (current) use of antibiotics: Secondary | ICD-10-CM | POA: Insufficient documentation

## 2014-10-29 DIAGNOSIS — G8929 Other chronic pain: Secondary | ICD-10-CM | POA: Insufficient documentation

## 2014-10-29 DIAGNOSIS — Z87442 Personal history of urinary calculi: Secondary | ICD-10-CM | POA: Insufficient documentation

## 2014-10-29 DIAGNOSIS — Z720 Tobacco use: Secondary | ICD-10-CM | POA: Insufficient documentation

## 2014-10-29 DIAGNOSIS — Z7982 Long term (current) use of aspirin: Secondary | ICD-10-CM | POA: Insufficient documentation

## 2014-10-29 LAB — URINALYSIS, ROUTINE W REFLEX MICROSCOPIC
GLUCOSE, UA: NEGATIVE mg/dL
KETONES UR: NEGATIVE mg/dL
Nitrite: NEGATIVE
PH: 6.5 (ref 5.0–8.0)
Specific Gravity, Urine: 1.025 (ref 1.005–1.030)
Urobilinogen, UA: 1 mg/dL (ref 0.0–1.0)

## 2014-10-29 LAB — URINE MICROSCOPIC-ADD ON

## 2014-10-29 MED ORDER — FENTANYL CITRATE (PF) 100 MCG/2ML IJ SOLN
50.0000 ug | Freq: Once | INTRAMUSCULAR | Status: AC
  Administered 2014-10-29: 50 ug via INTRAVENOUS
  Filled 2014-10-29: qty 2

## 2014-10-29 MED ORDER — ONDANSETRON HCL 4 MG/2ML IJ SOLN
4.0000 mg | Freq: Once | INTRAMUSCULAR | Status: AC
  Administered 2014-10-29: 4 mg via INTRAVENOUS
  Filled 2014-10-29: qty 2

## 2014-10-29 MED ORDER — KETOROLAC TROMETHAMINE 30 MG/ML IJ SOLN
30.0000 mg | Freq: Once | INTRAMUSCULAR | Status: AC
  Administered 2014-10-29: 30 mg via INTRAVENOUS
  Filled 2014-10-29: qty 1

## 2014-10-29 MED ORDER — FENTANYL CITRATE (PF) 100 MCG/2ML IJ SOLN
25.0000 ug | Freq: Once | INTRAMUSCULAR | Status: AC
  Administered 2014-10-29: 25 ug via INTRAVENOUS
  Filled 2014-10-29: qty 2

## 2014-10-29 MED ORDER — SODIUM CHLORIDE 0.9 % IV SOLN
INTRAVENOUS | Status: DC
  Administered 2014-10-29: 1000 mL via INTRAVENOUS

## 2014-10-29 MED ORDER — OXYCODONE-ACETAMINOPHEN 5-325 MG PO TABS: 1.0000 | ORAL_TABLET | Freq: Four times a day (QID) | ORAL | Status: DC | PRN

## 2014-10-29 NOTE — Discharge Instructions (Signed)
Drink plenty of fluids. Take the oxycodone for pain. Take your flomax until you pass the stone and use your phenergan for nausea as needed.  Recheck if you get a fever, or have uncontrolled vomiting or pain.

## 2014-10-29 NOTE — ED Notes (Signed)
Pt reports flank pain that started tonight more pain in groin than anywhere else.

## 2014-10-29 NOTE — ED Provider Notes (Signed)
CSN: [id number]     Arrival date & time 10/29/14  0344 History   First MD Initiated Contact with Patient 10/29/14 [phone]    Chief Complaint  Patient presents with  . Flank Pain     (Consider location/radiation/quality/duration/timing/severity/associated sxs/prior Treatment) HPI patient reports a long history of renal stones. He states his last kidney stone was a month ago however he was seen in the ED on September 2 and at that time he passed two left ureteral stones. His CT scan at that time showed he also had bilateral punctate stones present. He states he was awakened from sleep at 3:30 this morning with right-sided flank pain that radiates into his right groin. He has had vomiting twice. He still has nausea. He states nothing he does makes the pain worse, nothing he does makes it feel better. He denies gross hematuria. He states he has always been able to pass his kidney stones without intervention. He denies significant caffeine or milk ingestion. He does state he drinks a lot of water from the drinking fountain at work. He states the only family history of renal stones is in his half brother.  PCP none Urology Dr Jerre Simon (now retired)  Past Medical History  Diagnosis Date  . Strep throat   . Kidney stone   . Chronic flank pain    History reviewed. No pertinent past surgical history. Family History  Problem Relation Age of Onset  . Diabetes Father    Social History  Substance Use Topics  . Smoking status: Current Every Day Smoker -- 1.00 packs/day    Types: Cigarettes  . Smokeless tobacco: Never Used  . Alcohol Use: No  denies milk ingestion, caffienated drinks employed   Review of Systems  All other systems reviewed and are negative.     Allergies  Review of patient's allergies indicates no known allergies.  Home Medications   Prior to Admission medications   Medication Sig Start Date End Date Taking? Authorizing Provider  aspirin 325 MG tablet Take 325 mg by mouth  as needed for mild pain.    Historical Provider, MD  ciprofloxacin (CIPRO) 500 MG tablet Take 1 tablet (500 mg total) by mouth 2 (two) times daily. 10/06/14   Donnetta Hutching, MD  cyclobenzaprine (FLEXERIL) 5 MG tablet Take 1 tablet (5 mg total) by mouth 3 (three) times daily as needed. Patient not taking: Reported on 10/12/2014 09/18/14   Devoria Albe, MD  doxycycline (VIBRAMYCIN) 100 MG capsule Take 1 capsule (100 mg total) by mouth 2 (two) times daily. Patient not taking: Reported on 10/12/2014 09/10/14   Ivery Quale, PA-C  naproxen (NAPROSYN) 500 MG tablet Take 1 po BID with food prn pain Patient not taking: Reported on 10/12/2014 09/18/14   Devoria Albe, MD  oxyCODONE-acetaminophen (PERCOCET/ROXICET) 5-325 MG per tablet Take 1 tablet by mouth every 6 (six) hours as needed for moderate pain or severe pain. 10/29/14   Devoria Albe, MD  predniSONE (DELTASONE) 10 MG tablet 5,4,3,2,1 - take with food Patient not taking: Reported on 10/12/2014 09/10/14   Ivery Quale, PA-C   BP 149/97 mmHg  Pulse 78  Temp(Src) 97.6 F (36.4 C) (Oral)  Resp 18  Ht  (1.803 m)  Wt 205 lb (92.987 kg)  BMI 28.60 kg/m2  SpO2 100%  Vital signs normal   Physical Exam  Constitutional: He is oriented to person, place, and time. He appears well-developed and well-nourished.  Non-toxic appearance. He does not appear ill. He appears distressed.  Appears  painful  HENT:  Head: Normocephalic and atraumatic.  Right Ear: External ear normal.  Left Ear: External ear normal.  Nose: Nose normal. No mucosal edema or rhinorrhea.  Mouth/Throat: Oropharynx is clear and moist and mucous membranes are normal. No dental abscesses or uvula swelling.  Eyes: Conjunctivae and EOM are normal. Pupils are equal, round, and reactive to light.  Neck: Normal range of motion and full passive range of motion without pain. Neck supple.  Cardiovascular: Normal rate, regular rhythm and normal heart sounds.  Exam reveals no gallop and no friction rub.   No murmur  heard. Pulmonary/Chest: Effort normal and breath sounds normal. No respiratory distress. He has no wheezes. He has no rhonchi. He has no rales. He exhibits no tenderness and no crepitus.  Abdominal: Soft. Normal appearance and bowel sounds are normal. He exhibits no distension. There is tenderness. There is no rebound and no guarding.    Genitourinary:  Tender right flank  Musculoskeletal: Normal range of motion. He exhibits no edema or tenderness.  Moves all extremities well.   Neurological: He is alert and oriented to person, place, and time. He has normal strength. No cranial nerve deficit.  Skin: Skin is warm, dry and intact. No rash noted. No erythema. There is pallor.  Psychiatric: He has a normal mood and affect. His speech is normal and behavior is normal. His mood appears not anxious.  Nursing note and vitals reviewed.   ED Course  Procedures (including critical care time)  Medications  0.9 %  sodium chloride infusion (1,000 mLs Intravenous New Bag/Given 10/29/14 0448)  ketorolac (TORADOL) 30 MG/ML injection 30 mg (30 mg Intravenous Given 10/29/14 0452)  ondansetron (ZOFRAN) injection 4 mg (4 mg Intravenous Given 10/29/14 0450)  fentaNYL (SUBLIMAZE) injection 50 mcg (50 mcg Intravenous Given 10/29/14 0522)  fentaNYL (SUBLIMAZE) injection 25 mcg (25 mcg Intravenous Given 10/29/14 1610)    Review of patient's old records shows he has had 10 abdominal/pelvis CT scans in our system.  PT given IV fluids and IV toradol and IV zofran for nausea.  Recheck at 05:15 pt states his nausea is much improved, his pain is better, but not gone. Given more meds.   6:15 AM we discussed discharge plan. Patient states he has Flomax and Phenergan at home. He was given a second lower dose of fentanyl for pain. Patient looks comfortable. He has been working on his tablet.  Labs Review Results for orders placed or performed during the hospital encounter of 10/29/14  Urinalysis, Routine w reflex  microscopic (not at [facility])  Result Value Ref Range   Color, Urine YELLOW YELLOW   APPearance CLOUDY (A) CLEAR   Specific Gravity, Urine 1.025 1.005 - 1.030   pH 6.5 5.0 - 8.0   Glucose, UA NEGATIVE NEGATIVE mg/dL   Hgb urine dipstick LARGE (A) NEGATIVE   Bilirubin Urine SMALL (A) NEGATIVE   Ketones, ur NEGATIVE NEGATIVE mg/dL   Protein, ur TRACE (A) NEGATIVE mg/dL   Urobilinogen, UA 1.0 0.0 - 1.0 mg/dL   Nitrite NEGATIVE NEGATIVE   Leukocytes, UA TRACE (A) NEGATIVE  Urine microscopic-add on  Result Value Ref Range   WBC, UA 3-6 <3 WBC/hpf   RBC / HPF TOO NUMEROUS TO COUNT <3 RBC/hpf   Bacteria, UA FEW (A) RARE    Laboratory interpretation all normal except hematuria   Imaging Review No results found.    Dg Abd 1 View  10/06/2014   CLINICAL DATA:  Left-sided abdominal pain. Left inguinal pain, rule  out stone. Marland Kitchen  IMPRESSION: A 4 mm calcification in the left mid abdomen at the level 3, may reflect a stone in the left proximal ureter.   Electronically Signed   By: Rubye Oaks M.D.   On: 10/06/2014 23:27   Ct Renal Stone Study  10/12/2014   CLINICAL DATA:  29 year old male with subacute left flank, abdominal and pelvic pain with hematuria.   Adrenals/Urinary Tract: A new 3 mm left UPJ calculus and new 3 mm distal left ureteral calculus (3 cm above the left UVJ) are noted with mild fullness of the left intrarenal collecting system. Bilateral nonobstructing scratch de nonobstructing bilateral renal calculi are again identified measuring 1 to 4 mm.   IMPRESSION: New 3 mm left UPJ calculus and new 3 mm distal left ureteral calculus with mild fullness of the left intrarenal collecting system.  Nonobstructing bilateral renal calculi.  Hepatic steatosis.   Electronically Signed   By: Harmon Pier M.D.   On: 10/12/2014 18:34   I have personally reviewed and evaluated these images and lab results as part of my medical decision-making.   EKG Interpretation None      MDM   Final  diagnoses:  Right flank pain  presumed ureteral colic on the right  New Prescriptions   OXYCODONE-ACETAMINOPHEN (PERCOCET/ROXICET) 5-325 MG PER TABLET    Take 1 tablet by mouth every 6 (six) hours as needed for moderate pain or severe pain.    Plan discharge  Devoria Albe, MD, Concha Pyo, MD 10/29/14 [phone]

## 2014-10-29 NOTE — ED Notes (Signed)
Pt alert & oriented x4, stable gait. Patient given discharge instructions, paperwork & prescription(s). Patient informed not to drive, operate any equipment & handel any important documents 4 hours after taking pain medication. Patient  instructed to stop at the registration desk to finish any additional paperwork. Patient  verbalized understanding. Pt left department w/ no further questions. 

## 2014-12-17 ENCOUNTER — Emergency Department (HOSPITAL_COMMUNITY): Payer: BLUE CROSS/BLUE SHIELD

## 2014-12-17 ENCOUNTER — Encounter (HOSPITAL_COMMUNITY): Payer: Self-pay | Admitting: Emergency Medicine

## 2014-12-17 ENCOUNTER — Emergency Department (HOSPITAL_COMMUNITY)
Admission: EM | Admit: 2014-12-17 | Discharge: 2014-12-17 | Disposition: A | Discharge status: Home / Self Care | Payer: BLUE CROSS/BLUE SHIELD | Attending: Emergency Medicine | Admitting: Emergency Medicine

## 2014-12-17 DIAGNOSIS — Z8709 Personal history of other diseases of the respiratory system: Secondary | ICD-10-CM | POA: Insufficient documentation

## 2014-12-17 DIAGNOSIS — Z7982 Long term (current) use of aspirin: Secondary | ICD-10-CM | POA: Diagnosis not present

## 2014-12-17 DIAGNOSIS — M549 Dorsalgia, unspecified: Secondary | ICD-10-CM | POA: Diagnosis not present

## 2014-12-17 DIAGNOSIS — Z720 Tobacco use: Secondary | ICD-10-CM | POA: Insufficient documentation

## 2014-12-17 DIAGNOSIS — R112 Nausea with vomiting, unspecified: Secondary | ICD-10-CM | POA: Insufficient documentation

## 2014-12-17 DIAGNOSIS — G8929 Other chronic pain: Secondary | ICD-10-CM | POA: Diagnosis not present

## 2014-12-17 DIAGNOSIS — Z792 Long term (current) use of antibiotics: Secondary | ICD-10-CM | POA: Insufficient documentation

## 2014-12-17 DIAGNOSIS — Z87442 Personal history of urinary calculi: Secondary | ICD-10-CM | POA: Diagnosis not present

## 2014-12-17 DIAGNOSIS — R109 Unspecified abdominal pain: Secondary | ICD-10-CM

## 2014-12-17 LAB — URINALYSIS, ROUTINE W REFLEX MICROSCOPIC
Bilirubin Urine: NEGATIVE
GLUCOSE, UA: NEGATIVE mg/dL
HGB URINE DIPSTICK: NEGATIVE
KETONES UR: NEGATIVE mg/dL
Leukocytes, UA: NEGATIVE
Nitrite: NEGATIVE
PROTEIN: NEGATIVE mg/dL
Specific Gravity, Urine: 1.015 (ref 1.005–1.030)
Urobilinogen, UA: 0.2 mg/dL (ref 0.0–1.0)
pH: 6.5 (ref 5.0–8.0)

## 2014-12-17 LAB — BASIC METABOLIC PANEL
Anion gap: 9 (ref 5–15)
BUN: 11 mg/dL (ref 6–20)
CALCIUM: 9.6 mg/dL (ref 8.9–10.3)
CO2: 25 mmol/L (ref 22–32)
CREATININE: 0.85 mg/dL (ref 0.61–1.24)
Chloride: 105 mmol/L (ref 101–111)
GFR calc Af Amer: >60 mL/min (ref >60)
Glucose, Bld: 83 mg/dL (ref 65–99)
Potassium: 3.7 mmol/L (ref 3.5–5.1)
SODIUM: 139 mmol/L (ref 135–145)

## 2014-12-17 LAB — CBC WITH DIFFERENTIAL/PLATELET
Basophils Absolute: 0 10*3/uL (ref 0.0–0.1)
Basophils Relative: 0 %
EOS ABS: 0.2 10*3/uL (ref 0.0–0.7)
EOS PCT: 2 %
HCT: 48.4 % (ref 39.0–52.0)
Hemoglobin: 16.8 g/dL (ref 13.0–17.0)
LYMPHS ABS: 2.2 10*3/uL (ref 0.7–4.0)
Lymphocytes Relative: 16 %
MCH: 31.3 pg (ref 26.0–34.0)
MCHC: 34.7 g/dL (ref 30.0–36.0)
MCV: 90.1 fL (ref 78.0–100.0)
Monocytes Absolute: 0.9 10*3/uL (ref 0.1–1.0)
Monocytes Relative: 7 %
NEUTROS PCT: 75 %
Neutro Abs: 10.4 10*3/uL — ABNORMAL HIGH (ref 1.7–7.7)
PLATELETS: 246 10*3/uL (ref 150–400)
RBC: 5.37 MIL/uL (ref 4.22–5.81)
RDW: 12.8 % (ref 11.5–15.5)
WBC: 13.7 10*3/uL — AB (ref 4.0–10.5)

## 2014-12-17 MED ORDER — ONDANSETRON HCL 4 MG/2ML IJ SOLN
4.0000 mg | Freq: Once | INTRAMUSCULAR | Status: AC
  Administered 2014-12-17: 4 mg via INTRAVENOUS
  Filled 2014-12-17: qty 2

## 2014-12-17 MED ORDER — NAPROXEN 500 MG PO TABS: 500.0000 mg | ORAL_TABLET | Freq: Two times a day (BID) | ORAL | Status: DC

## 2014-12-17 MED ORDER — PROMETHAZINE HCL 25 MG PO TABS: 25.0000 mg | ORAL_TABLET | Freq: Four times a day (QID) | ORAL | Status: DC | PRN

## 2014-12-17 MED ORDER — SODIUM CHLORIDE 0.9 % IV BOLUS (SEPSIS)
1000.0000 mL | Freq: Once | INTRAVENOUS | Status: AC
  Administered 2014-12-17: 1000 mL via INTRAVENOUS

## 2014-12-17 MED ORDER — SODIUM CHLORIDE 0.9 % IV SOLN
INTRAVENOUS | Status: DC
  Administered 2014-12-17: 17:00:00 via INTRAVENOUS

## 2014-12-17 MED ORDER — FENTANYL CITRATE (PF) 100 MCG/2ML IJ SOLN
50.0000 ug | Freq: Once | INTRAMUSCULAR | Status: AC
  Administered 2014-12-17: 50 ug via INTRAVENOUS
  Filled 2014-12-17: qty 2

## 2014-12-17 NOTE — ED Notes (Signed)
C/o left flank pain.  History of kidney stones.  Rates pain 8/10.

## 2014-12-17 NOTE — Discharge Instructions (Signed)
CT scan without evidence of any ureteral stone at this time. May be musculoskeletal pain. Take the Naprosyn as directed take the Phenergan as needed for the nausea and vomiting. Return for any new or worse symptoms. Labs without any significant abnormalities.  Referral for a primary care doctor provided above as well as a resource guide provided below.   Emergency Department Resource Guide 1) Find a Doctor and Pay Out of Pocket Although you won't have to find out who is covered by your insurance plan, it is a good idea to ask around and get recommendations. You will then need to call the office and see if the doctor you have chosen will accept you as a new patient and what types of options they offer for patients who are self-pay. Some doctors offer discounts or will set up payment plans for their patients who do not have insurance, but you will need to ask so you aren't surprised when you get to your appointment.  2) Contact Your Local Health Department Not all health departments have doctors that can see patients for sick visits, but many do, so it is worth a call to see if yours does. If you don't know where your local health department is, you can check in your phone book. The CDC also has a tool to help you locate your state's health department, and many state websites also have listings of all of their local health departments.  3) Find a Walk-in Clinic If your illness is not likely to be very severe or complicated, you may want to try a walk in clinic. These are popping up all over the country in pharmacies, drugstores, and shopping centers. They're usually staffed by nurse practitioners or physician assistants that have been trained to treat common illnesses and complaints. They're usually fairly quick and inexpensive. However, if you have serious medical issues or chronic medical problems, these are probably not your best option.  No Primary Care Doctor: - Call Health Connect at  [phone] -  they can help you locate a primary care doctor that  accepts your insurance, provides certain services, etc. - Physician Referral Service- [phone]  Chronic Pain Problems: Organization         Address  Phone   Notes  [facility]  [phone]) [phone] Patients need to be referred by their primary care doctor.   Medication Assistance: Organization         Address  Phone   Notes  [facility] [address] [place], [place] [address] [phone] --Must be a resident of [place] -- Must have NO insurance coverage whatsoever (no Medicaid/ Medicare, etc.) -- The pt. MUST have a primary care doctor that directs their care regularly and follows them in the community   [facility]  [phone]   [employer]  [phone]    Agencies that provide inexpensive medical care: Organization         Address  Phone   Notes  [facility]  [phone]   [facility]    [phone]) [phone]   [facility] [address] [place], [place] [address] [phone]   [facility] [address], [place] [phone]   [facility]    [phone]) [phone]   [facility]    [phone](336) [phone]   [facility]  [address], [place] Phone:  [phone],  Fax:  [phone] Hours of Operation:  9 am - 6 pm, M-F.  Also accepts Medicaid/Medicare and self-pay.  °[facility] ° [address], [place] Phone: [phone], Fax: [phone]. Hours of Operation:  8:30 am - 5:30 pm, M-F.  Also accepts Medicaid and self-pay.  °[facility] [place] [address], [place] Phone: [phone]   °[facility] [address], [place], [place] [phone], Ext. 123 Mondays & Thursdays: 7-9 AM.  First 15 patients are seen on a first come, first serve basis. °  ° °Medicaid-accepting  [place] Providers: ° °Organization         Address  Phone   Notes  °[facility] [address], [place] [phone] Also accepts self-pay patients.  °[facility] [address], [place] ° ([phone]) [phone]   °[facility] [address], [place] ([phone]) [phone]   °[facility] [address] [place] [address], [place] [phone]) [phone]   °Veita Bland [address], [place]  ° [phone]) [phone] Only accepts [place] Access Medicaid patients after they have their name applied to their card.  ° °Self-Pay (no insurance) in [place]: ° °Organization         Address  Phone   Notes  °Sickle Cell Patients, [facility] [address], [place] ([phone]) [phone]   °[facility] [address], [place] ([phone]) [phone]   °[facility] ° [address], [place] ([phone]) [phone]   °[facility]/Dr. Osei-Bonsu ° [address] [place] [address], [place] or [address], [place] [phone]) [phone] Phone number for both [place] and [place] locations is the same.  °[facility] [address], [place] ([phone]) [phone]   °[facility] [address] [place] [address], [place] or [address] ([phone]) [phone] °([phone]) [phone]   °[facility] [address], [place] (336) [phone], phone; [phone], fax Sees patients 1st and 3rd Saturday of every month.  Must not qualify for public or private insurance (i.e. Medicaid, Medicare, [place] Health Choice, Veterans' Benefits) • Household income should be no more than 200% of the poverty level •The clinic cannot treat you if you are pregnant or think you are pregnant • Sexually transmitted diseases are not treated at the clinic.  ° ° °Dental Care: °Organization         Address  Phone  Notes  °[place] [facility] [address], [place] [phone]) [phone] Accepts children up to age 21 who are enrolled in Medicaid or [place] Health Choice; pregnant women with a Medicaid card; and children who have applied for Medicaid or [place] Health Choice, but were declined, whose parents can pay a reduced fee at time of service.  °[place] [facility] [place]  [address], [place] ([phone]) [phone] Accepts children up to age 21 who are enrolled in Medicaid or [place] Health Choice; pregnant women with a Medicaid card; and children who have applied for Medicaid or [place] Health Choice, but were declined, whose parents can pay a reduced fee at time of service.  °[facility] ° [address],  ([phone]) [phone] Patients are seen by appointment only. Walk-ins are not   accepted. [facility] will see patients 18 years of age and older. °Monday - Tuesday (8am-5pm) °Most Wednesdays (8:30-5pm) °$30 per visit, cash only  °[facility] ° [address], [place] [phone]) [phone] Patients are seen by appointment only. Walk-ins are not accepted. [facility] will see patients 18 years of age and older. °One Wednesday Evening (Monthly: Volunteer Based).  $30 per visit, cash only  °[facility]  ([phone]) [phone] for adults; Children under age 4, call Graduate Pediatric Dentistry at [phone]. Children aged 4-14, please call [phone] to request a pediatric application. ° Dental services are provided in all areas of dental care including fillings, crowns and bridges, complete and partial dentures, implants, gum treatment, root canals, and extractions. Preventive care is also provided. Treatment is provided to both adults and children. °Patients are selected via a lottery and there is often a waiting list. °  °[facility] [address], °[place] ° (336) [phone] [email or web address] °  °[facility] [address], [place], [place] ([phone], Ext. 123 Second and Fourth Thursday of each month, opens at 6:30 AM; Clinic ends at 9 AM.  Patients are seen on a first-come first-served basis, and a limited number are seen during each clinic.  ° °[facility] ° [address], [place], [place] [phone]) [phone]   Eligibility Requirements °You must have lived in [place], [place], or [place] counties for at least the last three months. °  You cannot be eligible for state or federal sponsored healthcare insurance, including Veterans Administration, Medicaid, or Medicare. °  You generally cannot be eligible for healthcare insurance through your employer.  °  How to apply: °Eligibility screenings are held every Tuesday and Wednesday afternoon from 1:00 pm until 4:00 pm. You do not need an appointment for the interview!  °[facility] [address], [place], [place] [phone]   °[facility]  [phone]   °[facility]  [phone]   °[facility]  336-570-6415   ° °Behavioral Health Resources in the Community: °Intensive Outpatient Programs °Organization         Address  Phone  Notes  °[facility] [place] [facility] [address], [place], [place] [phone]   °[facility] [address], [place], [place] [phone]   °[facility] [address], [place], [place] ° 336-882-2125   °[facility] [address],  °[place], [place] 1-800-853-5163 or 336-641-4981   °Substance Abuse Resources °Organization         Address  Phone  Notes  °[facility]  336-882-2125   °[facility]  336-784-9470   °[facility]  336-285-9073   °[facility]  336-845-3988   °Residential & Outpatient Substance Abuse Program  1-800-659-3381   °Psychological Services °Organization         Address  Phone  Notes  °[facility]   336- [phone]   °[facility]  336- 378-7881   °[facility] [address], [place] 1-800-853-5163 or 336-641-4981   ° °Mobile Crisis Teams °Organization         Address  Phone  Notes  °[facility], Mobile Crisis Care Unit  1-877-626-1772   °Assertive °[facility] ° [address] [place], [place] 336-834-9664   °Sharon DeEsch [address] °[place]  336-554-5454   ° °Self-Help/Support Groups °  Organization         Address  Phone             Notes  [facility] [place] - variety of support groups  336- I7437963 Call for more information  Narcotics Anonymous (NA), [facility] [address], [employer] [place]  2 meetings at this location   Statistician         Address  Phone  Notes  [facility] [address],    [place]  [phone]   [facility]  [address] [place] [address], [place], [place] [phone]   [facility] [address] [place], [place] [phone] Admissions: 8am-3pm M-F  [facility] [address],    [place], [place] [phone]   [facility] [address] [place], [place], [place] [phone]   [facility] [address],  [place], [place] [phone]   [facility] - Intensive Outpatient [address], [place], [place] [phone]   [facility] ([facility] [address] [place]  [place], [place] 5-573-220-2542 or (289) [phone]   [facility] ([facility]) [address] [place], [place] [phone] Accepts Medicaid  [facility] [address]  [place] [phone] Substance Abuse/Addiction Treatment   [facility] Organization         Address  Phone  Notes  [facility]  [phone]   Angie Fava, PhD [address] [place], [place]   [address] or  7262989693   [facility]   [address] [place], [place] [phone]   [facility] [address], [place], [place] [phone] Insurance/Medicaid/sponsorship through [facility] and Families [address]                                    [place], [place] [phone] Therapy/tele-psych/case  [facility] [address], [place] [phone]    Dr. Lolly Mustache  [phone]   [facility]  [employer] [facility]. 1) [address] [place] 2) [address], [place] 3)  [address], [place] [phone]  [phone]   [facility] Child Abuse Hotline 662-821-3520 or [phone] (After Hours)

## 2014-12-17 NOTE — ED Provider Notes (Signed)
CSN: [id number]     Arrival date & time 12/17/14  1526 History   First MD Initiated Contact with Patient 12/17/14 1647     Chief Complaint  Patient presents with  . Flank Pain    history of kidney stones     (Consider location/radiation/quality/duration/timing/severity/associated sxs/prior Treatment) Patient is a 29 y.o. male presenting with flank pain. The history is provided by the patient.  Flank Pain Pertinent negatives include no chest pain, no abdominal pain, no headaches and no shortness of breath.   patient with history of kidney stones. Patient with acute onset of left flank pain at 9 this morning associated with nausea and vomiting. The pain is 8 out of 10. Not made better or worse by anything. Reminds patient of prior kidney stones. No evidence of any blood in his urine. No fevers. Pain is sharp in nature.  Past Medical History  Diagnosis Date  . Strep throat   . Kidney stone   . Chronic flank pain    History reviewed. No pertinent past surgical history. Family History  Problem Relation Age of Onset  . Diabetes Father    Social History  Substance Use Topics  . Smoking status: Current Every Day Smoker -- 1.00 packs/day    Types: Cigarettes  . Smokeless tobacco: Never Used  . Alcohol Use: No    Review of Systems  Constitutional: Negative for fever.  HENT: Negative for congestion.   Eyes: Negative for redness.  Respiratory: Negative for shortness of breath.   Cardiovascular: Negative for chest pain.  Gastrointestinal: Positive for nausea and vomiting. Negative for abdominal pain and diarrhea.  Genitourinary: Positive for flank pain. Negative for hematuria.  Musculoskeletal: Positive for back pain.  Skin: Negative for rash.  Neurological: Negative for headaches.  Hematological: Does not bruise/bleed easily.  Psychiatric/Behavioral: Negative for confusion.      Allergies  Review of patient's allergies indicates no known allergies.  Home Medications    Prior to Admission medications   Medication Sig Start Date End Date Taking? Authorizing Provider  aspirin 325 MG tablet Take 325 mg by mouth as needed for mild pain.    Historical Provider, MD  ciprofloxacin (CIPRO) 500 MG tablet Take 1 tablet (500 mg total) by mouth 2 (two) times daily. 10/06/14   Donnetta Hutching, MD  cyclobenzaprine (FLEXERIL) 5 MG tablet Take 1 tablet (5 mg total) by mouth 3 (three) times daily as needed. Patient not taking: Reported on 10/12/2014 09/18/14   Devoria Albe, MD  doxycycline (VIBRAMYCIN) 100 MG capsule Take 1 capsule (100 mg total) by mouth 2 (two) times daily. Patient not taking: Reported on 10/12/2014 09/10/14   Ivery Quale, PA-C  naproxen (NAPROSYN) 500 MG tablet Take 1 po BID with food prn pain Patient not taking: Reported on 10/12/2014 09/18/14   Devoria Albe, MD  naproxen (NAPROSYN) 500 MG tablet Take 1 tablet (500 mg total) by mouth 2 (two) times daily. 12/17/14   Vanetta Mulders, MD  oxyCODONE-acetaminophen (PERCOCET/ROXICET) 5-325 MG per tablet Take 1 tablet by mouth every 6 (six) hours as needed for moderate pain or severe pain. 10/29/14   Devoria Albe, MD  predniSONE (DELTASONE) 10 MG tablet 5,4,3,2,1 - take with food Patient not taking: Reported on 10/12/2014 09/10/14   Ivery Quale, PA-C  promethazine (PHENERGAN) 25 MG tablet Take 1 tablet (25 mg total) by mouth every 6 (six) hours as needed. 12/17/14   Vanetta Mulders, MD   BP 123/80 mmHg  Pulse 90  Temp(Src) 98.4 F (36.9 C) (  Oral)  Resp 20  Ht 5\' 11"  (1.803 m)  Wt 205 lb (92.987 kg)  BMI 28.60 kg/m2  SpO2 99% Physical Exam  Constitutional: He is oriented to person, place, and time. He appears well-developed and well-nourished. No distress.  HENT:  Head: Normocephalic and atraumatic.  Mouth/Throat: Oropharynx is clear and moist.  Eyes: Conjunctivae and EOM are normal. Pupils are equal, round, and reactive to light.  Neck: Normal range of motion. Neck supple.  Cardiovascular: Normal rate, regular rhythm and normal  heart sounds.   No murmur heard. Pulmonary/Chest: Effort normal and breath sounds normal.  Abdominal: Bowel sounds are normal. There is no tenderness.  Musculoskeletal: Normal range of motion.  Neurological: He is alert and oriented to person, place, and time. No cranial nerve deficit. He exhibits normal muscle tone. Coordination normal.  Skin: Skin is warm. No rash noted.  Nursing note and vitals reviewed.   ED Course  Procedures (including critical care time) Labs Review Labs Reviewed  CBC WITH DIFFERENTIAL/PLATELET - Abnormal; Notable for the following:    WBC 13.7 (*)    Neutro Abs 10.4 (*)    All other components within normal limits  BASIC METABOLIC PANEL  URINALYSIS, ROUTINE W REFLEX MICROSCOPIC (NOT AT [facility])   Results for orders placed or performed during the hospital encounter of 12/17/14  CBC with Differential/Platelet  Result Value Ref Range   WBC 13.7 (H) 4.0 - 10.5 K/uL   RBC 5.37 4.22 - 5.81 MIL/uL   Hemoglobin 16.8 13.0 - 17.0 g/dL   HCT 78.248.4 95.639.0 - 21.352.0 %   MCV 90.1 78.0 - 100.0 fL   MCH 31.3 26.0 - 34.0 pg   MCHC 34.7 30.0 - 36.0 g/dL   RDW 08.612.8 57.811.5 - 46.915.5 %   Platelets 246 150 - 400 K/uL   Neutrophils Relative % 75 %   Neutro Abs 10.4 (H) 1.7 - 7.7 K/uL   Lymphocytes Relative 16 %   Lymphs Abs 2.2 0.7 - 4.0 K/uL   Monocytes Relative 7 %   Monocytes Absolute 0.9 0.1 - 1.0 K/uL   Eosinophils Relative 2 %   Eosinophils Absolute 0.2 0.0 - 0.7 K/uL   Basophils Relative 0 %   Basophils Absolute 0.0 0.0 - 0.1 K/uL  Basic metabolic panel  Result Value Ref Range   Sodium 139 135 - 145 mmol/L   Potassium 3.7 3.5 - 5.1 mmol/L   Chloride 105 101 - 111 mmol/L   CO2 25 22 - 32 mmol/L   Glucose, Bld 83 65 - 99 mg/dL   BUN 11 6 - 20 mg/dL   Creatinine, Ser 6.290.85 0.61 - 1.24 mg/dL   Calcium 9.6 8.9 - 52.810.3 mg/dL   GFR calc non Af Amer >60 >60 mL/min   GFR calc Af Amer >60 >60 mL/min   Anion gap 9 5 - 15  Urinalysis, Routine w reflex microscopic (not at [facility])   Result Value Ref Range   Color, Urine YELLOW YELLOW   APPearance CLEAR CLEAR   Specific Gravity, Urine 1.015 1.005 - 1.030   pH 6.5 5.0 - 8.0   Glucose, UA NEGATIVE NEGATIVE mg/dL   Hgb urine dipstick NEGATIVE NEGATIVE   Bilirubin Urine NEGATIVE NEGATIVE   Ketones, ur NEGATIVE NEGATIVE mg/dL   Protein, ur NEGATIVE NEGATIVE mg/dL   Urobilinogen, UA 0.2 0.0 - 1.0 mg/dL   Nitrite NEGATIVE NEGATIVE   Leukocytes, UA NEGATIVE NEGATIVE     Imaging Review Ct Renal Stone Study  12/17/2014  CLINICAL DATA:  Left flank pain.  History of urinary tract stones. EXAM: CT ABDOMEN AND PELVIS WITHOUT CONTRAST TECHNIQUE: Multidetector CT imaging of the abdomen and pelvis was performed following the standard protocol without IV contrast. COMPARISON:  CT abdomen and pelvis 10/12/2014. FINDINGS: The lung bases are clear.  No pleural or pericardial effusion. There is no hydronephrosis on the right or left. No ureteral stones are identified. There is no urinary bladder stone. Small bilateral renal stones are again seen, more numerous on the left. Fatty infiltration of the liver is unchanged. The gallbladder, spleen, adrenal glands and pancreas appear normal. The stomach, small and large bowel and appendix appear normal. There is no lymphadenopathy or fluid. No focal bony abnormality. IMPRESSION: No acute abnormality. Negative for hydronephrosis or ureteral stone. Small bilateral nonobstructing renal stones. Fatty infiltration of the liver. Electronically Signed   By: Drusilla Kanner M.D.   On: 12/17/2014 18:10   I have personally reviewed and evaluated these images and lab results as part of my medical decision-making.   EKG Interpretation None      MDM   Final diagnoses:  Flank pain    Workup for the left flank pain without any significant findings. No evidence of any ureteral stones. Patient will be treated with Naprosyn and antinausea medicine. CT scan without any acute abnormalities urinalysis normal,  mild leukocytosis. No significant electrolyte abnormalities.    Vanetta Mulders, MD 12/17/14 Rickey Primus

## 2015-02-18 ENCOUNTER — Emergency Department (HOSPITAL_COMMUNITY)
Admission: EM | Admit: 2015-02-18 | Discharge: 2015-02-18 | Disposition: A | Discharge status: Home / Self Care | Payer: BLUE CROSS/BLUE SHIELD | Attending: Emergency Medicine | Admitting: Emergency Medicine

## 2015-02-18 ENCOUNTER — Emergency Department (HOSPITAL_COMMUNITY): Payer: BLUE CROSS/BLUE SHIELD

## 2015-02-18 ENCOUNTER — Encounter (HOSPITAL_COMMUNITY): Payer: Self-pay | Admitting: Emergency Medicine

## 2015-02-18 DIAGNOSIS — Z791 Long term (current) use of non-steroidal anti-inflammatories (NSAID): Secondary | ICD-10-CM | POA: Insufficient documentation

## 2015-02-18 DIAGNOSIS — N200 Calculus of kidney: Secondary | ICD-10-CM | POA: Diagnosis not present

## 2015-02-18 DIAGNOSIS — F17210 Nicotine dependence, cigarettes, uncomplicated: Secondary | ICD-10-CM | POA: Insufficient documentation

## 2015-02-18 DIAGNOSIS — Z7982 Long term (current) use of aspirin: Secondary | ICD-10-CM | POA: Diagnosis not present

## 2015-02-18 DIAGNOSIS — Z8709 Personal history of other diseases of the respiratory system: Secondary | ICD-10-CM | POA: Diagnosis not present

## 2015-02-18 DIAGNOSIS — G8929 Other chronic pain: Secondary | ICD-10-CM | POA: Insufficient documentation

## 2015-02-18 DIAGNOSIS — Z792 Long term (current) use of antibiotics: Secondary | ICD-10-CM | POA: Insufficient documentation

## 2015-02-18 DIAGNOSIS — R109 Unspecified abdominal pain: Secondary | ICD-10-CM | POA: Diagnosis present

## 2015-02-18 LAB — COMPREHENSIVE METABOLIC PANEL
ALK PHOS: 85 U/L (ref 38–126)
ALT: 40 U/L (ref 17–63)
ANION GAP: 8 (ref 5–15)
AST: 28 U/L (ref 15–41)
Albumin: 4.1 g/dL (ref 3.5–5.0)
BILIRUBIN TOTAL: 0.7 mg/dL (ref 0.3–1.2)
BUN: 12 mg/dL (ref 6–20)
CALCIUM: 9 mg/dL (ref 8.9–10.3)
CO2: 24 mmol/L (ref 22–32)
Chloride: 106 mmol/L (ref 101–111)
Creatinine, Ser: 0.84 mg/dL (ref 0.61–1.24)
GLUCOSE: 89 mg/dL (ref 65–99)
POTASSIUM: 4.2 mmol/L (ref 3.5–5.1)
Sodium: 138 mmol/L (ref 135–145)
TOTAL PROTEIN: 7.7 g/dL (ref 6.5–8.1)

## 2015-02-18 LAB — CBC WITH DIFFERENTIAL/PLATELET
BASOS PCT: 1 %
Basophils Absolute: 0.1 10*3/uL (ref 0.0–0.1)
Eosinophils Absolute: 0.4 10*3/uL (ref 0.0–0.7)
Eosinophils Relative: 3 %
HEMATOCRIT: 48.3 % (ref 39.0–52.0)
HEMOGLOBIN: 16.5 g/dL (ref 13.0–17.0)
LYMPHS ABS: 2.5 10*3/uL (ref 0.7–4.0)
LYMPHS PCT: 20 %
MCH: 31.2 pg (ref 26.0–34.0)
MCHC: 34.2 g/dL (ref 30.0–36.0)
MCV: 91.3 fL (ref 78.0–100.0)
MONO ABS: 0.8 10*3/uL (ref 0.1–1.0)
MONOS PCT: 7 %
NEUTROS ABS: 8.9 10*3/uL — AB (ref 1.7–7.7)
NEUTROS PCT: 69 %
Platelets: 265 10*3/uL (ref 150–400)
RBC: 5.29 MIL/uL (ref 4.22–5.81)
RDW: 13.1 % (ref 11.5–15.5)
WBC: 12.7 10*3/uL — ABNORMAL HIGH (ref 4.0–10.5)

## 2015-02-18 LAB — URINALYSIS, ROUTINE W REFLEX MICROSCOPIC
Bilirubin Urine: NEGATIVE
GLUCOSE, UA: NEGATIVE mg/dL
KETONES UR: NEGATIVE mg/dL
Nitrite: NEGATIVE
PROTEIN: NEGATIVE mg/dL
Specific Gravity, Urine: 1.02 (ref 1.005–1.030)
pH: 6.5 (ref 5.0–8.0)

## 2015-02-18 LAB — URINE MICROSCOPIC-ADD ON

## 2015-02-18 MED ORDER — ONDANSETRON HCL 4 MG PO TABS: 4.0000 mg | ORAL_TABLET | Freq: Three times a day (TID) | ORAL | Status: DC | PRN

## 2015-02-18 MED ORDER — KETOROLAC TROMETHAMINE 30 MG/ML IJ SOLN
30.0000 mg | Freq: Once | INTRAMUSCULAR | Status: AC
  Administered 2015-02-18: 30 mg via INTRAVENOUS
  Filled 2015-02-18: qty 1

## 2015-02-18 MED ORDER — OXYCODONE-ACETAMINOPHEN 5-325 MG PO TABS: 1.0000 | ORAL_TABLET | Freq: Three times a day (TID) | ORAL | Status: DC | PRN

## 2015-02-18 MED ORDER — SODIUM CHLORIDE 0.9 % IV BOLUS (SEPSIS)
1000.0000 mL | Freq: Once | INTRAVENOUS | Status: AC
  Administered 2015-02-18: 1000 mL via INTRAVENOUS

## 2015-02-18 MED ORDER — TAMSULOSIN HCL 0.4 MG PO CAPS: 0.4000 mg | ORAL_CAPSULE | Freq: Every day | ORAL | Status: DC

## 2015-02-18 MED ORDER — IBUPROFEN 600 MG PO TABS: 600.0000 mg | ORAL_TABLET | Freq: Four times a day (QID) | ORAL | Status: DC | PRN

## 2015-02-18 MED ORDER — ONDANSETRON HCL 4 MG/2ML IJ SOLN
4.0000 mg | Freq: Once | INTRAMUSCULAR | Status: AC
  Administered 2015-02-18: 4 mg via INTRAMUSCULAR
  Filled 2015-02-18: qty 2

## 2015-02-18 MED ORDER — OXYCODONE-ACETAMINOPHEN 5-325 MG PO TABS
1.0000 | ORAL_TABLET | Freq: Once | ORAL | Status: AC
  Administered 2015-02-18: 1 via ORAL
  Filled 2015-02-18: qty 1

## 2015-02-18 NOTE — ED Provider Notes (Signed)
CSN: [id number]     Arrival date & time 02/18/15  1236 History   First MD Initiated Contact with Patient 02/18/15 1242     Chief Complaint  Patient presents with  . Flank Pain     (Consider location/radiation/quality/duration/timing/severity/associated sxs/prior Treatment) HPI 30 year old male with history of nephrolithiasis and chronic flank pain who presents with left flank pain. States that one day ago developed sudden onset severe sharp stabbing left-sided flank pain that radiated into the left groin. Pain constant, and unremitting. No aggravating or alleviating factors. He has noted stinging with urination, but denies any urinary frequency or hematuria. Denies any vomiting but has associating nausea. Denies any fevers or chills, diarrhea, melena or hematochezia. States that this is similar to prior history of kidney stone.    Past Medical History  Diagnosis Date  . Strep throat   . Kidney stone   . Chronic flank pain    History reviewed. No pertinent past surgical history. Family History  Problem Relation Age of Onset  . Diabetes Father    Social History  Substance Use Topics  . Smoking status: Current Every Day Smoker -- 1.00 packs/day    Types: Cigarettes  . Smokeless tobacco: Never Used  . Alcohol Use: No    Review of Systems 10/14 systems reviewed and are negative other than those stated in the HPI    Allergies  Review of patient's allergies indicates no known allergies.  Home Medications   Prior to Admission medications   Medication Sig Start Date End Date Taking? Authorizing Provider  aspirin 325 MG tablet Take 325 mg by mouth 2 (two) times daily as needed for mild pain.    Yes Historical Provider, MD  promethazine (PHENERGAN) 25 MG tablet Take 1 tablet (25 mg total) by mouth every 6 (six) hours as needed. Patient taking differently: Take 25 mg by mouth every 6 (six) hours as needed for nausea or vomiting.  12/17/14  Yes Vanetta Mulders, MD  ciprofloxacin  (CIPRO) 500 MG tablet Take 1 tablet (500 mg total) by mouth 2 (two) times daily. 10/06/14   Donnetta Hutching, MD  cyclobenzaprine (FLEXERIL) 5 MG tablet Take 1 tablet (5 mg total) by mouth 3 (three) times daily as needed. Patient not taking: Reported on 10/12/2014 09/18/14   Devoria Albe, MD  doxycycline (VIBRAMYCIN) 100 MG capsule Take 1 capsule (100 mg total) by mouth 2 (two) times daily. Patient not taking: Reported on 10/12/2014 09/10/14   Ivery Quale, PA-C  ibuprofen (ADVIL,MOTRIN) 600 MG tablet Take 1 tablet (600 mg total) by mouth every 6 (six) hours as needed. 02/18/15   Lavera Guise, MD  naproxen (NAPROSYN) 500 MG tablet Take 1 po BID with food prn pain Patient not taking: Reported on 10/12/2014 09/18/14   Devoria Albe, MD  naproxen (NAPROSYN) 500 MG tablet Take 1 tablet (500 mg total) by mouth 2 (two) times daily. 12/17/14   Vanetta Mulders, MD  ondansetron (ZOFRAN) 4 MG tablet Take 1 tablet (4 mg total) by mouth every 8 (eight) hours as needed for nausea or vomiting. 02/18/15   Lavera Guise, MD  oxyCODONE-acetaminophen (PERCOCET/ROXICET) 5-325 MG per tablet Take 1 tablet by mouth every 6 (six) hours as needed for moderate pain or severe pain. 10/29/14   Devoria Albe, MD  oxyCODONE-acetaminophen (PERCOCET/ROXICET) 5-325 MG tablet Take 1-2 tablets by mouth every 8 (eight) hours as needed for moderate pain or severe pain. 02/18/15   Lavera Guise, MD  predniSONE (DELTASONE) 10 MG tablet 5,4,3,2,1 -  take with food Patient not taking: Reported on 10/12/2014 09/10/14   Ivery Quale, PA-C  tamsulosin (FLOMAX) 0.4 MG CAPS capsule Take 1 capsule (0.4 mg total) by mouth daily. 02/18/15   Lavera Guise, MD   BP 168/86 mmHg  Pulse 104  Temp(Src) 97.5 F (36.4 C) (Oral)  Resp 16  Ht 5\' 11"  (1.803 m)  Wt 205 lb (92.987 kg)  BMI 28.60 kg/m2  SpO2 98% Physical Exam Physical Exam  Nursing note and vitals reviewed. Constitutional: Well developed, well nourished, non-toxic, and in no acute distress Head: Normocephalic and atraumatic.   Mouth/Throat: Oropharynx is clear and moist.  Neck: Normal range of motion. Neck supple.  Cardiovascular: Normal rate and regular rhythm.   Pulmonary/Chest: Effort normal and breath sounds normal.  Abdominal: Soft. There is minimal left lower abdominal tenderness. There is no rebound and no guarding. Stated left CVA tenderness to percussion Musculoskeletal: Normal range of motion.  Neurological: Alert, no facial droop, fluent speech, moves all extremities symmetrically Skin: Skin is warm and dry.  Psychiatric: Cooperative  ED Course  Procedures (including critical care time) Labs Review Labs Reviewed  CBC WITH DIFFERENTIAL/PLATELET - Abnormal; Notable for the following:    WBC 12.7 (*)    Neutro Abs 8.9 (*)    All other components within normal limits  URINALYSIS, ROUTINE W REFLEX MICROSCOPIC (NOT AT [facility]) - Abnormal; Notable for the following:    Hgb urine dipstick SMALL (*)    Leukocytes, UA TRACE (*)    All other components within normal limits  URINE MICROSCOPIC-ADD ON - Abnormal; Notable for the following:    Squamous Epithelial / LPF 0-5 (*)    Bacteria, UA RARE (*)    All other components within normal limits  COMPREHENSIVE METABOLIC PANEL    Imaging Review Dg Abd 1 View  02/18/2015  CLINICAL DATA:  Left flank pain. EXAM: ABDOMEN - 1 VIEW COMPARISON:  CT 12/17/2014. FINDINGS: Soft tissue structures are unremarkable. Stable pelvic calcifications noted most consistent with phleboliths. Left nephrolithiasis again noted. IMPRESSION: Left nephrolithiasis again noted. No evidence of urolithiasis. Tiny pelvic calcifications are most likely phleboliths and appears stable from prior CT of 12/17/2014. Electronically Signed   By: Maisie Fus  Register   On: 02/18/2015 14:11   I have personally reviewed and evaluated these images and lab results as part of my medical decision-making.   EKG Interpretation None       EMERGENCY DEPARTMENT US RENAL EXAM  "Study: Limited Retroperitoneal  Ultrasound of Kidneys"  INDICATIONS: Flank pain  Long and short axis of both kidneys were obtained.   PERFORMED BY: Myself  IMAGES ARCHIVED?: Yes  LIMITATIONS: None  VIEWS USED: Long axis and Short axis   INTERPRETATION: No Hydronephrosis   CPT Code: [phone] (limited retroperitoneal)    MDM   Final diagnoses:  Left flank pain  Kidney stone    30 year old male with history of chronic flank pain and recurrent nephrolithiasis/urolithiasis who presents with 1 day of left flank pain and dysuria. On presentation is well-appearing, comfortable, and in no acute distress. Vital signs are non-concerning. Mild left sided low tenderness and left CVA tenderness noted, which he states is consistent with prior history of kidney stones. UA with some blood but no signs of infection. Bedside ultrasound without significant signs of hydronephrosis. XR with nephrolithiasis on the left but without urolithiasis.given that he states that this is consistent with prior history of stones, he'll be treated clinically as such. Many renal stone CTs, and I do  not think he requires CT imaging at this time.  Is given IV fluids with Toradol. Discharged with flomax, ibuprofen, and small amount of percocets. Will follow-up with his urologist regarding further management. Strict return and follow-up instructions reviewed. She/He expressed understanding of all discharge instructions and felt comfortable with the plan of care.     Lavera Guiseana Duo Ednah Hammock, MD 02/18/15 [phone]

## 2015-02-18 NOTE — Discharge Instructions (Signed)
Please follow-up with urology regarding your kidney stones. Return for worsening symptoms, including fever, vomiting and unable to keep down food/fluids, worsening pain, or any other symptoms concerning to you.  Flank Pain Flank pain refers to pain that is located on the side of the body between the upper abdomen and the back. The pain may occur over a short period of time (acute) or may be long-term or reoccurring (chronic). It may be mild or severe. Flank pain can be caused by many things. CAUSES  Some of the more common causes of flank pain include:  Muscle strains.   Muscle spasms.   A disease of your spine (vertebral disk disease).   A lung infection (pneumonia).   Fluid around your lungs (pulmonary edema).   A kidney infection.   Kidney stones.   A very painful skin rash caused by the chickenpox virus (shingles).   Gallbladder disease.  HOME CARE INSTRUCTIONS  Home care will depend on the cause of your pain. In general,  Rest as directed by your caregiver.  Drink enough fluids to keep your urine clear or pale yellow.  Only take over-the-counter or prescription medicines as directed by your caregiver. Some medicines may help relieve the pain.  Tell your caregiver about any changes in your pain.  Follow up with your caregiver as directed. SEEK IMMEDIATE MEDICAL CARE IF:   Your pain is not controlled with medicine.   You have new or worsening symptoms.  Your pain increases.   You have abdominal pain.   You have shortness of breath.   You have persistent nausea or vomiting.   You have swelling in your abdomen.   You feel faint or pass out.   You have blood in your urine.  You have a fever or persistent symptoms for more than 2-3 days.  You have a fever and your symptoms suddenly get worse. MAKE SURE YOU:   Understand these instructions.  Will watch your condition.  Will get help right away if you are not doing well or get worse.     This information is not intended to replace advice given to you by your health care provider. Make sure you discuss any questions you have with your health care provider.   Document Released: 03/19/2005 Document Revised: 10/21/2011 Document Reviewed: 09/10/2011 Elsevier Interactive Patient Education 2016 Elsevier Inc.  Kidney Stones Kidney stones (urolithiasis) are solid masses that form inside your kidneys. The intense pain is caused by the stone moving through the kidney, ureter, bladder, and urethra (urinary tract). When the stone moves, the ureter starts to spasm around the stone. The stone is usually passed in your pee (urine).  HOME CARE  Drink enough fluids to keep your pee clear or pale yellow. This helps to get the stone out.  Take a 24-hour pee (urine) sample as told by your doctor. You may need to take another sample every 6-12 months.  Strain all pee through the provided strainer. Do not pee without peeing through the strainer, not even once. If you pee the stone out, catch it in the strainer. The stone may be as small as a grain of salt. Take this to your doctor. This will help your doctor figure out what you can do to try to prevent more kidney stones.  Only take medicine as told by your doctor.  Make changes to your daily diet as told by your doctor. You may be told to:  Limit how much salt you eat.  Eat 5 or  more servings of fruits and vegetables each day.  Limit how much meat, poultry, fish, and eggs you eat.  Keep all follow-up visits as told by your doctor. This is important.  Get follow-up X-rays as told by your doctor. GET HELP IF: You have pain that gets worse even if you have been taking pain medicine. GET HELP RIGHT AWAY IF:   Your pain does not get better with medicine.  You have a fever or shaking chills.  Your pain increases and gets worse over 18 hours.  You have new belly (abdominal) pain.  You feel faint or pass out.  You are unable to pee.    This information is not intended to replace advice given to you by your health care provider. Make sure you discuss any questions you have with your health care provider.   Document Released: 07/15/2007 Document Revised: 10/17/2014 Document Reviewed: 06/29/2012 Elsevier Interactive Patient Education [employer]2016 Elsevier Inc.

## 2015-02-18 NOTE — ED Notes (Signed)
Pt has left sided flank pain radiating into groin, painful urination. Denies odor, blood, fever, chills. Pt has hx of kidney stones.

## 2015-04-19 ENCOUNTER — Emergency Department (HOSPITAL_COMMUNITY)
Admission: EM | Admit: 2015-04-19 | Discharge: 2015-04-19 | Disposition: A | Discharge status: Home / Self Care | Payer: BLUE CROSS/BLUE SHIELD | Attending: Emergency Medicine | Admitting: Emergency Medicine

## 2015-04-19 ENCOUNTER — Encounter (HOSPITAL_COMMUNITY): Payer: Self-pay | Admitting: Emergency Medicine

## 2015-04-19 ENCOUNTER — Emergency Department (HOSPITAL_COMMUNITY): Payer: BLUE CROSS/BLUE SHIELD

## 2015-04-19 DIAGNOSIS — F17210 Nicotine dependence, cigarettes, uncomplicated: Secondary | ICD-10-CM | POA: Diagnosis not present

## 2015-04-19 DIAGNOSIS — N201 Calculus of ureter: Secondary | ICD-10-CM

## 2015-04-19 DIAGNOSIS — R110 Nausea: Secondary | ICD-10-CM | POA: Diagnosis not present

## 2015-04-19 DIAGNOSIS — R109 Unspecified abdominal pain: Secondary | ICD-10-CM

## 2015-04-19 DIAGNOSIS — N202 Calculus of kidney with calculus of ureter: Secondary | ICD-10-CM | POA: Diagnosis not present

## 2015-04-19 DIAGNOSIS — N23 Unspecified renal colic: Secondary | ICD-10-CM

## 2015-04-19 LAB — URINALYSIS, ROUTINE W REFLEX MICROSCOPIC
BILIRUBIN URINE: NEGATIVE
Glucose, UA: NEGATIVE mg/dL
KETONES UR: NEGATIVE mg/dL
Leukocytes, UA: NEGATIVE
NITRITE: NEGATIVE
Protein, ur: NEGATIVE mg/dL
Specific Gravity, Urine: 1.025 (ref 1.005–1.030)
pH: 5.5 (ref 5.0–8.0)

## 2015-04-19 LAB — URINE MICROSCOPIC-ADD ON
Bacteria, UA: NONE SEEN
Squamous Epithelial / HPF: NONE SEEN

## 2015-04-19 MED ORDER — NAPROXEN 250 MG PO TABS: 250.0000 mg | ORAL_TABLET | Freq: Two times a day (BID) | ORAL | Status: DC | PRN

## 2015-04-19 MED ORDER — OXYCODONE-ACETAMINOPHEN 5-325 MG PO TABS: ORAL_TABLET | ORAL | Status: DC

## 2015-04-19 MED ORDER — KETOROLAC TROMETHAMINE 60 MG/2ML IM SOLN
60.0000 mg | Freq: Once | INTRAMUSCULAR | Status: AC
  Administered 2015-04-19: 60 mg via INTRAMUSCULAR
  Filled 2015-04-19: qty 2

## 2015-04-19 MED ORDER — OXYCODONE-ACETAMINOPHEN 5-325 MG PO TABS
2.0000 | ORAL_TABLET | Freq: Once | ORAL | Status: AC
  Administered 2015-04-19: 2 via ORAL
  Filled 2015-04-19: qty 2

## 2015-04-19 MED ORDER — ONDANSETRON 8 MG PO TBDP
8.0000 mg | ORAL_TABLET | Freq: Once | ORAL | Status: AC
  Administered 2015-04-19: 8 mg via ORAL
  Filled 2015-04-19: qty 1

## 2015-04-19 MED ORDER — TAMSULOSIN HCL 0.4 MG PO CAPS: 0.4000 mg | ORAL_CAPSULE | Freq: Every day | ORAL | Status: DC

## 2015-04-19 MED ORDER — ONDANSETRON HCL 4 MG PO TABS: 4.0000 mg | ORAL_TABLET | Freq: Three times a day (TID) | ORAL | Status: DC | PRN

## 2015-04-19 NOTE — Discharge Instructions (Signed)
Take the prescriptions as directed.  Call the Urologist today to schedule a follow up appointment in the next 3 days.  Return to the Emergency Department immediately if worsening. ° °

## 2015-04-19 NOTE — ED Notes (Signed)
PT c/o left flank pain x1 hour with burning urination. PT c/o hx of kidney stones.

## 2015-04-19 NOTE — ED Provider Notes (Signed)
CSN: [id number]     Arrival date & time 04/19/15  1120 History   First MD Initiated Contact with Patient 04/19/15 1330     Chief Complaint  Patient presents with  . Flank Pain      HPI Pt was seen at 1400. Per pt, c/o sudden onset and persistence of waxing and waning left sided flank "pain" that began 1 hour PTA. Pt describes the pain as "like my last kidney stone," and radiating into the left side of his abd.  Has been associated with nausea.  Denies testicular pain/swelling, no dysuria/hematuria, no abd pain, no diarrhea, no black or blood in stools, no CP/SOB, no fevers, no rash. The symptoms have been associated with no other complaints. The patient has a significant history of similar symptoms previously, recently being evaluated for this complaint and multiple prior evals for same.  Pt has not f/u with Urologist as previously recommended.    Past Medical History  Diagnosis Date  . Strep throat   . Kidney stone   . Chronic flank pain    History reviewed. No pertinent past surgical history.   Family History  Problem Relation Age of Onset  . Diabetes Father    Social History  Substance Use Topics  . Smoking status: Current Every Day Smoker -- 1.00 packs/day    Types: Cigarettes  . Smokeless tobacco: Never Used  . Alcohol Use: No    Review of Systems ROS: Statement: All systems negative except as marked or noted in the HPI; Constitutional: Negative for fever and chills. ; ; Eyes: Negative for eye pain, redness and discharge. ; ; ENMT: Negative for ear pain, hoarseness, nasal congestion, sinus pressure and sore throat. ; ; Cardiovascular: Negative for chest pain, palpitations, diaphoresis, dyspnea and peripheral edema. ; ; Respiratory: Negative for cough, wheezing and stridor. ; ; Gastrointestinal: +nausea. Negative for vomiting, diarrhea, abdominal pain, blood in stool, hematemesis, jaundice and rectal bleeding. . ; ; Genitourinary: +flank pain. Negative for dysuria and hematuria.  ; ; Genital:  No penile drainage or rash, no testicular pain or swelling, no scrotal rash or swelling. ;; Musculoskeletal: Negative for back pain and neck pain. Negative for swelling and trauma.; ; Skin: Negative for pruritus, rash, abrasions, blisters, bruising and skin lesion.; ; Neuro: Negative for headache, lightheadedness and neck stiffness. Negative for weakness, altered level of consciousness , altered mental status, extremity weakness, paresthesias, involuntary movement, seizure and syncope.      Allergies  Review of patient's allergies indicates no known allergies.  Home Medications   Prior to Admission medications   Not on File   BP 133/85 mmHg  Pulse 87  Temp(Src) 98 F (36.7 C) (Oral)  Resp 18  Ht  (1.803 m)  Wt 215 lb (97.523 kg)  BMI 30.00 kg/m2  SpO2 98% Physical Exam  1405: Physical examination:  Nursing notes reviewed; Vital signs and O2 SAT reviewed;  Constitutional: Well developed, Well nourished, Well hydrated, In no acute distress; Head:  Normocephalic, atraumatic; Eyes: EOMI, PERRL, No scleral icterus; ENMT: Mouth and pharynx normal, Mucous membranes moist; Neck: Supple, Full range of motion, No lymphadenopathy; Cardiovascular: Regular rate and rhythm, No murmur, rub, or gallop; Respiratory: Breath sounds clear & equal bilaterally, No rales, rhonchi, wheezes.  Speaking full sentences with ease, Normal respiratory effort/excursion; Chest: Nontender, Movement normal; Abdomen: Soft, Nontender, Nondistended, Normal bowel sounds; Genitourinary: No CVA tenderness; Extremities: Pulses normal, No tenderness, No edema, No calf edema or asymmetry.; Neuro: AA&Ox3, Major CN grossly intact.  Speech clear. No gross focal motor or sensory deficits in extremities.; Skin: Color normal, Warm, Dry.   ED Course  Procedures (including critical care time) Labs Review  Imaging Review  I have personally reviewed and evaluated these images and lab results as part of my medical  decision-making.   EKG Interpretation None      MDM  MDM Reviewed: previous chart, nursing note and vitals Reviewed previous: labs, CT scan and x-ray Interpretation: labs and CT scan     Results for orders placed or performed during the hospital encounter of 04/19/15  Urinalysis, Routine w reflex microscopic (not at [facility])  Result Value Ref Range   Color, Urine YELLOW YELLOW   APPearance CLEAR CLEAR   Specific Gravity, Urine 1.025 1.005 - 1.030   pH 5.5 5.0 - 8.0   Glucose, UA NEGATIVE NEGATIVE mg/dL   Hgb urine dipstick MODERATE (A) NEGATIVE   Bilirubin Urine NEGATIVE NEGATIVE   Ketones, ur NEGATIVE NEGATIVE mg/dL   Protein, ur NEGATIVE NEGATIVE mg/dL   Nitrite NEGATIVE NEGATIVE   Leukocytes, UA NEGATIVE NEGATIVE  Urine microscopic-add on  Result Value Ref Range   Squamous Epithelial / LPF NONE SEEN NONE SEEN   WBC, UA 0-5 0 - 5 WBC/hpf   RBC / HPF 6-30 0 - 5 RBC/hpf   Bacteria, UA NONE SEEN NONE SEEN   Ct Renal Stone Study 04/19/2015  CLINICAL DATA:  Left flank pain EXAM: CT ABDOMEN AND PELVIS WITHOUT CONTRAST TECHNIQUE: Multidetector CT imaging of the abdomen and pelvis was performed following the standard protocol without IV contrast. COMPARISON:  CT 12/17/2014 FINDINGS: Lower chest:  Lung bases clear without infiltrate or effusion. Hepatobiliary: Fatty infiltration liver. No focal liver lesion. Gallbladder and bile ducts normal. Pancreas: Negative Spleen: Negative Adrenals/Urinary Tract: 4.5 x 8 mm stone in the proximal left ureter without significant hydronephrosis. Multiple 3 mm left renal calculi. No right renal calculus. No mass lesion. Urinary bladder normal. Stomach/Bowel: Negative for bowel obstruction. No bowel mass or edema. Appendix normal. Vascular/Lymphatic: Normal aorta. No adenopathy in the abdomen or pelvis Reproductive: Prostate mildly enlarged. Other: No free-fluid Musculoskeletal: No acute skeletal abnormality. No fracture or mass. IMPRESSION: 4.5 x 8 mm  stone proximal left ureter without significant obstruction. Additional small left renal calculi. Fatty liver. Electronically Signed   By: Marlan Palauharles  Clark M.D.   On: 04/19/2015 13:56    1455:  CT scan with ureteral stone. Meds given with improvement. Long hx of chronic recurrent flank pain and ureteral colic with multiple ED visits for same.  Pt endorses acute flair of his usual long standing chronic recurrent symptoms today, no change from his usual chronic symptom pattern.  Pt encouraged to f/u with the Urologist for good continuity of care and control of his chronic recurrent symptoms.  Pt verb understanding.    Samuel JesterKathleen Solyana Nonaka, DO 04/22/15 1624

## 2015-04-24 ENCOUNTER — Ambulatory Visit (INDEPENDENT_AMBULATORY_CARE_PROVIDER_SITE_OTHER): Payer: BLUE CROSS/BLUE SHIELD | Admitting: Urology

## 2015-04-24 DIAGNOSIS — N201 Calculus of ureter: Secondary | ICD-10-CM | POA: Diagnosis not present

## 2015-04-24 DIAGNOSIS — N200 Calculus of kidney: Secondary | ICD-10-CM

## 2015-04-30 ENCOUNTER — Other Ambulatory Visit: Payer: Self-pay | Admitting: Urology

## 2015-04-30 ENCOUNTER — Ambulatory Visit (INDEPENDENT_AMBULATORY_CARE_PROVIDER_SITE_OTHER): Payer: BLUE CROSS/BLUE SHIELD | Admitting: Urology

## 2015-04-30 ENCOUNTER — Ambulatory Visit (HOSPITAL_COMMUNITY)
Admission: RE | Admit: 2015-04-30 | Discharge: 2015-04-30 | Disposition: A | Discharge status: Home / Self Care | Payer: BLUE CROSS/BLUE SHIELD | Source: Ambulatory Visit | Attending: Urology | Admitting: Urology

## 2015-04-30 DIAGNOSIS — N200 Calculus of kidney: Secondary | ICD-10-CM | POA: Diagnosis not present

## 2015-04-30 DIAGNOSIS — N201 Calculus of ureter: Secondary | ICD-10-CM

## 2015-05-08 ENCOUNTER — Ambulatory Visit (INDEPENDENT_AMBULATORY_CARE_PROVIDER_SITE_OTHER): Payer: BLUE CROSS/BLUE SHIELD | Admitting: Urology

## 2015-05-08 ENCOUNTER — Other Ambulatory Visit: Payer: Self-pay | Admitting: Urology

## 2015-05-08 DIAGNOSIS — N201 Calculus of ureter: Secondary | ICD-10-CM

## 2015-05-09 ENCOUNTER — Encounter (HOSPITAL_COMMUNITY): Payer: Self-pay | Admitting: General Practice

## 2015-05-09 NOTE — H&P (Signed)
History of Present Illness  This man has worked in for persistent pain from a left proximal ureteral stone, 5 mm in size.  This was diagnosed about 2 weeks ago.  He saw Dr. Thea SilversmithMackenzie here last week.  He is almost out of the Percocet that was prescribed.  No fever, no chills.  He does have some nausea but no vomiting.  He does have some loose stools.  He is able to tolerate liquids and solids.   Past Medical History  1. History of Recurrent nephrolithiasis (N20.0)  Surgical History  1. History of No Surgical Problems  Current Meds  1. Flomax 0.4 MG Oral Capsule (Tamsulosin HCl);  Therapy: (Recorded:15Mar2017) to Recorded  2. Hydrocodone-Acetaminophen 5-325 MG Oral Tablet;  Therapy: (Recorded:15Mar2017) to Recorded  3. Ibuprofen 800 MG Oral Tablet; TAKE 1 TABLET 3 TIMES DAILY AS NEEDED;  Therapy: 15Mar2017 to (Evaluate:25Mar2017); Last Rx:15Mar2017 Ordered  4. Naproxen TABS;  Therapy: (Recorded:15Mar2017) to Recorded  5. Promethazine HCl - 12.5 MG Oral Tablet; TAKE 1 TABLET EVERY 4 TO 6 HOURS AS  NEEDED FOR NAUSEA;  Therapy: 15Mar2017 to (Evaluate:20Mar2017); Last Rx:15Mar2017 Ordered  Allergies  1. No Known Drug Allergies  Family History  1. No pertinent family history  Social History  1. Denied: History of Alcohol use  2. Caffeine use (F15.90)  3. 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 Single  5. Tobacco use (Z72.0)   1 pack for 8 years  Vitals Vital Signs [Data Includes: Last 1 Day]  Recorded: 21Mar2017 03:43PM  Blood Pressure: 138 / 83 Temperature: 98 F Heart Rate: 120  Results/Data  KUB was obtained today.  I reviewed the image as well as prior CT images with the patient.  There is a persistent calcification just to the left of the L3 transverse process, in a similar location to prior CT scanning.  There are 3 smaller left renal calculi, punctate right renal calculi.     Assessment  1. Ureteral stone (N20.1)   Persistent left proximal ureteral stone-patient still in  pain but tolerating it fairly well, although he has gone through all of his pain medicines   Plan Nephrolithiasis   1. UA With REFLEX; [Do Not Release]; Status:Hold For - [employer]/Data Collection;  Requested for:21Mar2017;  Ureteral stone   2. Start: Oxycodone-Acetaminophen 5-325 MG Oral Tablet; TAKE 1 TO 2 TABLETS EVERY 4  TO 6 HOURS AS NEEDED FOR PAIN  3. Follow-up Keep Future Appt Office  Follow-up  Status: Hold For - Appointment  Requested  for: 21Mar2017  Discussion/Summary  Percocet No. 30 prescribed  He will keep his appointment here on the 29th of this month-if he still has his stone symptoms by that time, I have spelled out treatment options-shockwave lithotripsy versus ureteroscopy.   After a thorough review of the management options for the patient's condition the patient  elected to proceed with surgical therapy as noted above. We have discussed the potential benefits and risks of the procedure, side effects of the proposed treatment, the likelihood of the patient achieving the goals of the procedure, and any potential problems that might occur during the procedure or recuperation. Informed consent has been obtained.

## 2015-05-13 ENCOUNTER — Ambulatory Visit (HOSPITAL_COMMUNITY)
Admission: RE | Admit: 2015-05-13 | Discharge: 2015-05-13 | Disposition: A | Discharge status: Home / Self Care | Payer: BLUE CROSS/BLUE SHIELD | Source: Ambulatory Visit | Attending: Urology | Admitting: Urology

## 2015-05-13 ENCOUNTER — Encounter (HOSPITAL_COMMUNITY)
Admission: RE | Disposition: A | Discharge status: Home / Self Care | Payer: Self-pay | Source: Ambulatory Visit | Attending: Urology

## 2015-05-13 ENCOUNTER — Ambulatory Visit (HOSPITAL_COMMUNITY): Payer: BLUE CROSS/BLUE SHIELD

## 2015-05-13 DIAGNOSIS — Z79899 Other long term (current) drug therapy: Secondary | ICD-10-CM | POA: Insufficient documentation

## 2015-05-13 DIAGNOSIS — E669 Obesity, unspecified: Secondary | ICD-10-CM | POA: Diagnosis not present

## 2015-05-13 DIAGNOSIS — Z791 Long term (current) use of non-steroidal anti-inflammatories (NSAID): Secondary | ICD-10-CM | POA: Insufficient documentation

## 2015-05-13 DIAGNOSIS — Z87442 Personal history of urinary calculi: Secondary | ICD-10-CM | POA: Diagnosis not present

## 2015-05-13 DIAGNOSIS — N201 Calculus of ureter: Secondary | ICD-10-CM | POA: Diagnosis not present

## 2015-05-13 DIAGNOSIS — Z79891 Long term (current) use of opiate analgesic: Secondary | ICD-10-CM | POA: Insufficient documentation

## 2015-05-13 DIAGNOSIS — F17200 Nicotine dependence, unspecified, uncomplicated: Secondary | ICD-10-CM | POA: Insufficient documentation

## 2015-05-13 SURGERY — LITHOTRIPSY, ESWL
Anesthesia: LOCAL | Laterality: Left

## 2015-05-13 MED ORDER — DIAZEPAM 5 MG PO TABS
10.0000 mg | ORAL_TABLET | ORAL | Status: AC
  Administered 2015-05-13: 10 mg via ORAL
  Filled 2015-05-13: qty 2

## 2015-05-13 MED ORDER — SODIUM CHLORIDE 0.9 % IV SOLN
INTRAVENOUS | Status: DC
  Administered 2015-05-13: 10:00:00 via INTRAVENOUS

## 2015-05-13 MED ORDER — DIPHENHYDRAMINE HCL 25 MG PO CAPS
25.0000 mg | ORAL_CAPSULE | ORAL | Status: AC
  Administered 2015-05-13: 25 mg via ORAL
  Filled 2015-05-13: qty 1

## 2015-05-13 MED ORDER — CIPROFLOXACIN HCL 500 MG PO TABS
500.0000 mg | ORAL_TABLET | ORAL | Status: AC
  Administered 2015-05-13: 500 mg via ORAL
  Filled 2015-05-13: qty 1

## 2015-05-13 NOTE — Interval H&P Note (Signed)
History and Physical Interval Note:  05/13/2015 7:31 AM  Dale Martinez  has presented today for surgery, with the diagnosis of LEFT URETERAL CALCULUS  The various methods of treatment have been discussed with the patient and family. After consideration of risks, benefits and other options for treatment, the patient has consented to  Procedure(s): LEFT EXTRACORPOREAL SHOCK WAVE LITHOTRIPSY (ESWL) (Left) as a surgical intervention .  The patient's history has been reviewed, patient examined, no change in status, stable for surgery.  I have reviewed the patient's chart and labs.  Questions were answered to the patient's satisfaction.     Ellsie Violette A

## 2015-06-12 ENCOUNTER — Encounter (HOSPITAL_COMMUNITY): Payer: Self-pay | Admitting: Emergency Medicine

## 2015-06-12 ENCOUNTER — Emergency Department (HOSPITAL_COMMUNITY): Payer: BLUE CROSS/BLUE SHIELD

## 2015-06-12 ENCOUNTER — Emergency Department (HOSPITAL_COMMUNITY)
Admission: EM | Admit: 2015-06-12 | Discharge: 2015-06-12 | Disposition: A | Discharge status: Home / Self Care | Payer: BLUE CROSS/BLUE SHIELD | Attending: Emergency Medicine | Admitting: Emergency Medicine

## 2015-06-12 DIAGNOSIS — R109 Unspecified abdominal pain: Secondary | ICD-10-CM | POA: Insufficient documentation

## 2015-06-12 DIAGNOSIS — R110 Nausea: Secondary | ICD-10-CM | POA: Diagnosis not present

## 2015-06-12 DIAGNOSIS — F17210 Nicotine dependence, cigarettes, uncomplicated: Secondary | ICD-10-CM | POA: Diagnosis not present

## 2015-06-12 DIAGNOSIS — N390 Urinary tract infection, site not specified: Secondary | ICD-10-CM

## 2015-06-12 LAB — URINE MICROSCOPIC-ADD ON

## 2015-06-12 LAB — CBC WITH DIFFERENTIAL/PLATELET
Basophils Absolute: 0 10*3/uL (ref 0.0–0.1)
Basophils Relative: 0 %
EOS PCT: 4 %
Eosinophils Absolute: 0.6 10*3/uL (ref 0.0–0.7)
HCT: 44 % (ref 39.0–52.0)
HEMOGLOBIN: 14.8 g/dL (ref 13.0–17.0)
LYMPHS ABS: 3.6 10*3/uL (ref 0.7–4.0)
LYMPHS PCT: 29 %
MCH: 31 pg (ref 26.0–34.0)
MCHC: 33.6 g/dL (ref 30.0–36.0)
MCV: 92.2 fL (ref 78.0–100.0)
MONOS PCT: 7 %
Monocytes Absolute: 0.9 10*3/uL (ref 0.1–1.0)
Neutro Abs: 7.5 10*3/uL (ref 1.7–7.7)
Neutrophils Relative %: 60 %
PLATELETS: 229 10*3/uL (ref 150–400)
RBC: 4.77 MIL/uL (ref 4.22–5.81)
RDW: 13.1 % (ref 11.5–15.5)
WBC: 12.7 10*3/uL — AB (ref 4.0–10.5)

## 2015-06-12 LAB — COMPREHENSIVE METABOLIC PANEL
ALK PHOS: 76 U/L (ref 38–126)
ALT: 43 U/L (ref 17–63)
AST: 27 U/L (ref 15–41)
Albumin: 4.1 g/dL (ref 3.5–5.0)
Anion gap: 8 (ref 5–15)
BUN: 16 mg/dL (ref 6–20)
CALCIUM: 9.1 mg/dL (ref 8.9–10.3)
CO2: 25 mmol/L (ref 22–32)
CREATININE: 0.81 mg/dL (ref 0.61–1.24)
Chloride: 107 mmol/L (ref 101–111)
Glucose, Bld: 92 mg/dL (ref 65–99)
Potassium: 4 mmol/L (ref 3.5–5.1)
Sodium: 140 mmol/L (ref 135–145)
Total Bilirubin: 0.4 mg/dL (ref 0.3–1.2)
Total Protein: 7.4 g/dL (ref 6.5–8.1)

## 2015-06-12 LAB — URINALYSIS, ROUTINE W REFLEX MICROSCOPIC
Bilirubin Urine: NEGATIVE
Glucose, UA: NEGATIVE mg/dL
Ketones, ur: NEGATIVE mg/dL
Leukocytes, UA: NEGATIVE
NITRITE: NEGATIVE
pH: 5.5 (ref 5.0–8.0)

## 2015-06-12 MED ORDER — MORPHINE SULFATE (PF) 4 MG/ML IV SOLN
6.0000 mg | Freq: Once | INTRAVENOUS | Status: AC
  Administered 2015-06-12: 6 mg via INTRAVENOUS
  Filled 2015-06-12: qty 2

## 2015-06-12 MED ORDER — TAMSULOSIN HCL 0.4 MG PO CAPS: 0.4000 mg | ORAL_CAPSULE | Freq: Every day | ORAL | Status: DC

## 2015-06-12 MED ORDER — ONDANSETRON 4 MG PO TBDP: 4.0000 mg | ORAL_TABLET | Freq: Three times a day (TID) | ORAL | Status: DC | PRN

## 2015-06-12 MED ORDER — IBUPROFEN 800 MG PO TABS: 800.0000 mg | ORAL_TABLET | Freq: Three times a day (TID) | ORAL | Status: DC | PRN

## 2015-06-12 MED ORDER — KETOROLAC TROMETHAMINE 30 MG/ML IJ SOLN
30.0000 mg | Freq: Once | INTRAMUSCULAR | Status: AC
  Administered 2015-06-12: 30 mg via INTRAVENOUS
  Filled 2015-06-12: qty 1

## 2015-06-12 MED ORDER — OXYCODONE-ACETAMINOPHEN 5-325 MG PO TABS: 1.0000 | ORAL_TABLET | ORAL | Status: DC | PRN

## 2015-06-12 MED ORDER — CEPHALEXIN 500 MG PO CAPS: 500.0000 mg | ORAL_CAPSULE | Freq: Four times a day (QID) | ORAL | Status: DC

## 2015-06-12 MED ORDER — SODIUM CHLORIDE 0.9 % IV BOLUS (SEPSIS)
1000.0000 mL | Freq: Once | INTRAVENOUS | Status: AC
  Administered 2015-06-12: 1000 mL via INTRAVENOUS

## 2015-06-12 MED ORDER — ONDANSETRON HCL 4 MG/2ML IJ SOLN
4.0000 mg | Freq: Once | INTRAMUSCULAR | Status: AC
  Administered 2015-06-12: 4 mg via INTRAVENOUS
  Filled 2015-06-12: qty 2

## 2015-06-12 MED ORDER — DEXTROSE 5 % IV SOLN
1.0000 g | Freq: Once | INTRAVENOUS | Status: AC
  Administered 2015-06-12: 1 g via INTRAVENOUS
  Filled 2015-06-12: qty 10

## 2015-06-12 NOTE — ED Provider Notes (Signed)
TIME SEEN: 2:25 AM  CHIEF COMPLAINT: Left flank pain  HPI: Pt is a 30 y.o. male with history of kidney stones who is followed by urologist at [facility] (Dr. Loney LaurenceAlstead) who presents emergency department with left flank pain. Patient has had history of lithotripsy, last time was one month ago. Reports he has been passing small stones and tonight his pain got worse. He has had nausea but no vomiting. No diarrhea. No fevers or chills. He has had some discomfort with urination. No testicular pain, swelling. No penile discharge. Reports this feels similar to his prior kidney stones.  ROS: See HPI Constitutional: no fever  Eyes: no drainage  ENT: no runny nose   Cardiovascular:  no chest pain  Resp: no SOB  GI: no vomiting GU: no dysuria Integumentary: no rash  Allergy: no hives  Musculoskeletal: no leg swelling  Neurological: no slurred speech ROS otherwise negative  PAST MEDICAL HISTORY/PAST SURGICAL HISTORY:  Past Medical History  Diagnosis Date  . Strep throat   . Kidney stone   . Chronic flank pain     MEDICATIONS:  Prior to Admission medications   Medication Sig Start Date End Date Taking? Authorizing Provider  naproxen (NAPROSYN) 250 MG tablet Take 1 tablet (250 mg total) by mouth 2 (two) times daily as needed for mild pain or moderate pain (take with food). 04/19/15   Samuel JesterKathleen McManus, DO  ondansetron (ZOFRAN) 4 MG tablet Take 1 tablet (4 mg total) by mouth every 8 (eight) hours as needed for nausea or vomiting. 04/19/15   Samuel JesterKathleen McManus, DO  oxyCODONE-acetaminophen (PERCOCET/ROXICET) 5-325 MG tablet 1 or 2 tabs PO q6h prn pain 04/19/15   Samuel JesterKathleen McManus, DO  promethazine (PHENERGAN) 12.5 MG tablet Take 12.5 mg by mouth as needed for nausea or vomiting.    Historical Provider, MD  tamsulosin (FLOMAX) 0.4 MG CAPS capsule Take 1 capsule (0.4 mg total) by mouth at bedtime. 04/19/15   Samuel JesterKathleen McManus, DO    ALLERGIES:  No Known Allergies  SOCIAL HISTORY:  Social History   Substance Use Topics  . Smoking status: Current Every Day Smoker -- 1.00 packs/day    Types: Cigarettes  . Smokeless tobacco: Never Used  . Alcohol Use: No    FAMILY HISTORY: Family History  Problem Relation Age of Onset  . Diabetes Father     EXAM: BP 143/96 mmHg  Pulse 98  Temp(Src) 97.5 F (36.4 C) (Oral)  Resp 20  Ht 5\' 11"  (1.803 m)  Wt 220 lb (99.791 kg)  BMI 30.70 kg/m2  SpO2 98% CONSTITUTIONAL: Alert and oriented and responds appropriately to questions. Well-appearing; well-nourished, Afebrile, nontoxic, in no significant distress HEAD: Normocephalic EYES: Conjunctivae clear, PERRL ENT: normal nose; no rhinorrhea; moist mucous membranes NECK: Supple, no meningismus, no LAD  CARD: RRR; S1 and S2 appreciated; no murmurs, no clicks, no rubs, no gallops RESP: Normal chest excursion without splinting or tachypnea; breath sounds clear and equal bilaterally; no wheezes, no rhonchi, no rales, no hypoxia or respiratory distress, speaking full sentences ABD/GI: Normal bowel sounds; non-distended; soft, non-tender, no rebound, no guarding, no peritoneal signs BACK:  The back appears normal and is non-tender to palpation, there is no CVA tenderness, no midline spinal tenderness or step-off or deformity EXT: Normal ROM in all joints; non-tender to palpation; no edema; normal capillary refill; no cyanosis, no calf tenderness or swelling    SKIN: Normal color for age and race; warm; no rash NEURO: Moves all extremities equally, sensation to light touch  intact diffusely, cranial nerves II through XII intact PSYCH: The patient's mood and manner are appropriate. Grooming and personal hygiene are appropriate.  MEDICAL DECISION MAKING: Patient here with left flank pain. He does have a documented history of ureterolithiasis. Does not appear in any significant distress today. Has had many CT scans in the past. Would like to avoid CT radiation at this time and patient agrees. We'll obtain  labs, urine, abdominal x-ray given he has had persistent left ureteral stone seen on x-rays for the past several months. We'll give IV fluids, morphine, Toradol, Zofran and reassess.  ED PROGRESS: Patient's labs show mild leukocytosis which appears chronic for patient. Creatinine is normal. Urine shows large hemoglobin with many bacteria. No white blood cells, nitrites. We'll send urine culture. Will treat with ceftriaxone for possible UTI. Abdominal x-ray shows renal stones but the previous left ureteral stone is no longer identified. Given patient reports feeling much better and he is afebrile, nontoxic with mild appearing urine, D feel he is safe for discharge. We'll discharge with Keflex, Percocet, Zofran, Flomax, ibuprofen. Have advised him to call his urologist in the morning. Discussed return precautions. He verbalized understanding.   At this time, I do not feel there is any life-threatening condition present. I have reviewed and discussed all results (EKG, imaging, lab, urine as appropriate), exam findings with patient. I have reviewed nursing notes and appropriate previous records.  I feel the patient is safe to be discharged home without further emergent workup. Discussed usual and customary return precautions. Patient and family (if present) verbalize understanding and are comfortable with this plan.  Patient will follow-up with their primary care provider. If they do not have a primary care provider, information for follow-up has been provided to them. All questions have been answered.      Layla Maw Ward, DO 06/12/15 0501

## 2015-06-12 NOTE — Discharge Instructions (Signed)
Flank Pain °Flank pain refers to pain that is located on the side of the body between the upper abdomen and the back. The pain may occur over a short period of time (acute) or may be long-term or reoccurring (chronic). It may be mild or severe. Flank pain can be caused by many things. °CAUSES  °Some of the more common causes of flank pain include: °· Muscle strains.   °· Muscle spasms.   °· A disease of your spine (vertebral disk disease).   °· A lung infection (pneumonia).   °· Fluid around your lungs (pulmonary edema).   °· A kidney infection.   °· Kidney stones.   °· A very painful skin rash caused by the chickenpox virus (shingles).   °· Gallbladder disease.   °HOME CARE INSTRUCTIONS  °Home care will depend on the cause of your pain. In general, °· Rest as directed by your caregiver. °· Drink enough fluids to keep your urine clear or pale yellow. °· Only take over-the-counter or prescription medicines as directed by your caregiver. Some medicines may help relieve the pain. °· Tell your caregiver about any changes in your pain. °· Follow up with your caregiver as directed. °SEEK IMMEDIATE MEDICAL CARE IF:  °· Your pain is not controlled with medicine.   °· You have new or worsening symptoms. °· Your pain increases.   °· You have abdominal pain.   °· You have shortness of breath.   °· You have persistent nausea or vomiting.   °· You have swelling in your abdomen.   °· You feel faint or pass out.   °· You have blood in your urine. °· You have a fever or persistent symptoms for more than 2-3 days. °· You have a fever and your symptoms suddenly get worse. °MAKE SURE YOU:  °· Understand these instructions. °· Will watch your condition. °· Will get help right away if you are not doing well or get worse. °  °This information is not intended to replace advice given to you by your health care provider. Make sure you discuss any questions you have with your health care provider. °  °Document Released: 03/19/2005 Document  Revised: 10/21/2011 Document Reviewed: 09/10/2011 °Elsevier Interactive Patient Education ©2016 [employer] ° °Urinary Tract Infection °Urinary tract infections (UTIs) can develop anywhere along your urinary tract. Your urinary tract is your body's drainage system for removing wastes and extra water. Your urinary tract includes two kidneys, two ureters, a bladder, and a urethra. Your kidneys are a pair of bean-shaped organs. Each kidney is about the size of your fist. They are located below your ribs, one on each side of your spine. °CAUSES °Infections are caused by microbes, which are microscopic organisms, including fungi, viruses, and bacteria. These organisms are so small that they can only be seen through a microscope. Bacteria are the microbes that most commonly cause UTIs. °SYMPTOMS  °Symptoms of UTIs may vary by age and gender of the patient and by the location of the infection. Symptoms in young women typically include a frequent and intense urge to urinate and a painful, burning feeling in the bladder or urethra during urination. Older women and men are more likely to be tired, shaky, and weak and have muscle aches and abdominal pain. A fever may mean the infection is in your kidneys. Other symptoms of a kidney infection include pain in your back or sides below the ribs, nausea, and vomiting. °DIAGNOSIS °To diagnose a UTI, your caregiver will ask you about your symptoms. Your caregiver will also ask you to provide a urine sample. The   urine sample will be tested for bacteria and white blood cells. White blood cells are made by your body to help fight infection. °TREATMENT  °Typically, UTIs can be treated with medication. Because most UTIs are caused by a bacterial infection, they usually can be treated with the use of antibiotics. The choice of antibiotic and length of treatment depend on your symptoms and the type of bacteria causing your infection. °HOME CARE INSTRUCTIONS °· If you were prescribed  antibiotics, take them exactly as your caregiver instructs you. Finish the medication even if you feel better after you have only taken some of the medication. °· Drink enough water and fluids to keep your urine clear or pale yellow. °· Avoid caffeine, tea, and carbonated beverages. They tend to irritate your bladder. °· Empty your bladder often. Avoid holding urine for long periods of time. °· Empty your bladder before and after sexual intercourse. °· After a bowel movement, women should cleanse from front to back. Use each tissue only once. °SEEK MEDICAL CARE IF:  °· You have back pain. °· You develop a fever. °· Your symptoms do not begin to resolve within 3 days. °SEEK IMMEDIATE MEDICAL CARE IF:  °· You have severe back pain or lower abdominal pain. °· You develop chills. °· You have nausea or vomiting. °· You have continued burning or discomfort with urination. °MAKE SURE YOU:  °· Understand these instructions. °· Will watch your condition. °· Will get help right away if you are not doing well or get worse. °  °This information is not intended to replace advice given to you by your health care provider. Make sure you discuss any questions you have with your health care provider. °  °Document Released: 11/05/2004 Document Revised: 10/17/2014 Document Reviewed: 03/06/2011 °Elsevier Interactive Patient Education ©2016 Elsevier Inc. ° °

## 2015-06-12 NOTE — ED Notes (Signed)
Patient has left flank pain, with nausea, started yesterday, last month had lithotripsy last month for kidney stones on same side.

## 2015-06-14 LAB — URINE CULTURE

## 2015-06-17 ENCOUNTER — Emergency Department (HOSPITAL_COMMUNITY): Payer: BLUE CROSS/BLUE SHIELD

## 2015-06-17 ENCOUNTER — Emergency Department (HOSPITAL_COMMUNITY)
Admission: EM | Admit: 2015-06-17 | Discharge: 2015-06-17 | Disposition: A | Discharge status: Home / Self Care | Payer: BLUE CROSS/BLUE SHIELD | Attending: Emergency Medicine | Admitting: Emergency Medicine

## 2015-06-17 ENCOUNTER — Encounter (HOSPITAL_COMMUNITY): Payer: Self-pay | Admitting: Emergency Medicine

## 2015-06-17 DIAGNOSIS — F17210 Nicotine dependence, cigarettes, uncomplicated: Secondary | ICD-10-CM | POA: Diagnosis not present

## 2015-06-17 DIAGNOSIS — G8929 Other chronic pain: Secondary | ICD-10-CM

## 2015-06-17 DIAGNOSIS — R109 Unspecified abdominal pain: Secondary | ICD-10-CM | POA: Diagnosis present

## 2015-06-17 DIAGNOSIS — Z79899 Other long term (current) drug therapy: Secondary | ICD-10-CM | POA: Insufficient documentation

## 2015-06-17 DIAGNOSIS — Z791 Long term (current) use of non-steroidal anti-inflammatories (NSAID): Secondary | ICD-10-CM | POA: Diagnosis not present

## 2015-06-17 LAB — URINALYSIS, ROUTINE W REFLEX MICROSCOPIC
Bilirubin Urine: NEGATIVE
GLUCOSE, UA: NEGATIVE mg/dL
Hgb urine dipstick: NEGATIVE
Ketones, ur: NEGATIVE mg/dL
LEUKOCYTES UA: NEGATIVE
NITRITE: NEGATIVE
PH: 7 (ref 5.0–8.0)
Protein, ur: NEGATIVE mg/dL
SPECIFIC GRAVITY, URINE: 1.015 (ref 1.005–1.030)

## 2015-06-17 MED ORDER — ONDANSETRON HCL 4 MG PO TABS: 4.0000 mg | ORAL_TABLET | Freq: Three times a day (TID) | ORAL | Status: DC | PRN

## 2015-06-17 MED ORDER — METHOCARBAMOL 500 MG PO TABS: 1000.0000 mg | ORAL_TABLET | Freq: Four times a day (QID) | ORAL | Status: DC | PRN

## 2015-06-17 MED ORDER — ONDANSETRON 8 MG PO TBDP
8.0000 mg | ORAL_TABLET | Freq: Once | ORAL | Status: AC
  Administered 2015-06-17: 8 mg via ORAL
  Filled 2015-06-17: qty 1

## 2015-06-17 MED ORDER — NAPROXEN 250 MG PO TABS: 250.0000 mg | ORAL_TABLET | Freq: Two times a day (BID) | ORAL | Status: DC | PRN

## 2015-06-17 MED ORDER — KETOROLAC TROMETHAMINE 60 MG/2ML IM SOLN
60.0000 mg | Freq: Once | INTRAMUSCULAR | Status: AC
  Administered 2015-06-17: 60 mg via INTRAMUSCULAR
  Filled 2015-06-17: qty 2

## 2015-06-17 MED ORDER — FENTANYL CITRATE (PF) 100 MCG/2ML IJ SOLN
50.0000 ug | Freq: Once | INTRAMUSCULAR | Status: AC
  Administered 2015-06-17: 50 ug via INTRAMUSCULAR
  Filled 2015-06-17: qty 2

## 2015-06-17 NOTE — ED Provider Notes (Signed)
CSN: [id number]     Arrival date & time 06/17/15  1602 History   First MD Initiated Contact with Patient 06/17/15 1850     Chief Complaint  Patient presents with  . Flank Pain      HPI Pt was seen at 1900. Per pt, c/o gradual onset and persistence of constant acute flair of his chronic left sided flank "pain" for the past 5 days. Has been associated with nausea. Describes the pain as "I think I have another kidney stone." Endorses hx of frequent kidney stones.  Denies incont/retention of bowel or bladder, no saddle anesthesia, no focal motor weakness, no tingling/numbness in extremities, no fevers, no injury, no abd pain, no vomiting/diarrhea.   The symptoms have been associated with no other complaints. The patient has a significant history of similar symptoms previously, recently being evaluated for this complaint and multiple prior evals for same. Pt's last ED visit was 5 days ago, dx UTI, rx antibiotics. Pt denies dysuria/hematuria, no testicular pain/swelling.     Past Medical History  Diagnosis Date  . Strep throat   . Kidney stone   . Chronic flank pain    Past Surgical History  Procedure Laterality Date  . Lithotripsy     Family History  Problem Relation Age of Onset  . Diabetes Father    Social History  Substance Use Topics  . Smoking status: Current Every Day Smoker -- 1.00 packs/day    Types: Cigarettes  . Smokeless tobacco: Never Used  . Alcohol Use: No    Review of Systems ROS: Statement: All systems negative except as marked or noted in the HPI; Constitutional: Negative for fever and chills. ; ; Eyes: Negative for eye pain, redness and discharge. ; ; ENMT: Negative for ear pain, hoarseness, nasal congestion, sinus pressure and sore throat. ; ; Cardiovascular: Negative for chest pain, palpitations, diaphoresis, dyspnea and peripheral edema. ; ; Respiratory: Negative for cough, wheezing and stridor. ; ; Gastrointestinal: Negative for nausea, vomiting, diarrhea,  abdominal pain, blood in stool, hematemesis, jaundice and rectal bleeding. . ; ; Genitourinary: Negative for dysuria and hematuria. ; ; Genital:  No penile drainage or rash, no testicular pain or swelling, no scrotal rash or swelling. ;; Musculoskeletal: +LBP. Negative for neck pain. Negative for swelling and trauma.; ; Skin: Negative for pruritus, rash, abrasions, blisters, bruising and skin lesion.; ; Neuro: Negative for headache, lightheadedness and neck stiffness. Negative for weakness, altered level of consciousness , altered mental status, extremity weakness, paresthesias, involuntary movement, seizure and syncope.      Allergies  Review of patient's allergies indicates no known allergies.  Home Medications   Prior to Admission medications   Medication Sig Start Date End Date Taking? Authorizing Provider  cephALEXin (KEFLEX) 500 MG capsule Take 1 capsule (500 mg total) by mouth 4 (four) times daily. 06/12/15  Yes Kristen N Ward, DO  ibuprofen (ADVIL,MOTRIN) 800 MG tablet Take 1 tablet (800 mg total) by mouth every 8 (eight) hours as needed for mild pain. 06/12/15  Yes Kristen N Ward, DO  ondansetron (ZOFRAN ODT) 4 MG disintegrating tablet Take 1 tablet (4 mg total) by mouth every 8 (eight) hours as needed for nausea or vomiting. 06/12/15  Yes Kristen N Ward, DO  oxyCODONE-acetaminophen (PERCOCET/ROXICET) 5-325 MG tablet Take 1 tablet by mouth every 4 (four) hours as needed. 06/12/15  Yes Kristen N Ward, DO  tamsulosin (FLOMAX) 0.4 MG CAPS capsule Take 1 capsule (0.4 mg total) by mouth daily. 06/12/15  Yes Baxter Hire  N Ward, DO   BP 125/80 mmHg  Pulse 72  Temp(Src) 98 F (36.7 C) (Oral)  Resp 18  Ht 5\' 11"  (1.803 m)  Wt 220 lb (99.791 kg)  BMI 30.70 kg/m2  SpO2 98% Physical Exam  1905: Physical examination:  Nursing notes reviewed; Vital signs and O2 SAT reviewed;  Constitutional: Well developed, Well nourished, Well hydrated, In no acute distress; Head:  Normocephalic, atraumatic; Eyes: EOMI,  PERRL, No scleral icterus; ENMT: Mouth and pharynx normal, Mucous membranes moist; Neck: Supple, Full range of motion, No lymphadenopathy; Cardiovascular: Regular rate and rhythm, No murmur, rub, or gallop; Respiratory: Breath sounds clear & equal bilaterally, No rales, rhonchi, wheezes.  Speaking full sentences with ease, Normal respiratory effort/excursion; Chest: Nontender, Movement normal; Abdomen: Soft, Nontender, Nondistended, Normal bowel sounds; Genitourinary: No CVA tenderness; Spine:  No midline CS, TS, LS tenderness. +TTP left lumbar paraspinal muscles.;; Extremities: Pulses normal, No tenderness, No edema, No calf edema or asymmetry.; Neuro: AA&Ox3, Major CN grossly intact.  Speech clear. No gross focal motor or sensory deficits in extremities.; Skin: Color normal, Warm, Dry.   ED Course  Procedures (including critical care time) Labs Review  Imaging Review  I have personally reviewed and evaluated these images and lab results as part of my medical decision-making.   EKG Interpretation None      MDM  MDM Reviewed: previous chart, nursing note and vitals Reviewed previous: labs, CT scan and x-ray Interpretation: labs and CT scan     Results for orders placed or performed during the hospital encounter of 06/17/15  Urinalysis, Routine w reflex microscopic ([facility])  Result Value Ref Range   Color, Urine YELLOW YELLOW   APPearance CLEAR CLEAR   Specific Gravity, Urine 1.015 1.005 - 1.030   pH 7.0 5.0 - 8.0   Glucose, UA NEGATIVE NEGATIVE mg/dL   Hgb urine dipstick NEGATIVE NEGATIVE   Bilirubin Urine NEGATIVE NEGATIVE   Ketones, ur NEGATIVE NEGATIVE mg/dL   Protein, ur NEGATIVE NEGATIVE mg/dL   Nitrite NEGATIVE NEGATIVE   Leukocytes, UA NEGATIVE NEGATIVE    Ct Renal Stone Study 06/17/2015  CLINICAL DATA:  Flank pain that started 3-4 days ago. History of stones. EXAM: CT ABDOMEN AND PELVIS WITHOUT CONTRAST TECHNIQUE: Multidetector CT imaging of the abdomen and pelvis  was performed following the standard protocol without IV contrast. COMPARISON:  04/19/2015 FINDINGS: Lower chest:  Lung bases are clear. Hepatobiliary: Low-density of the liver is suggestive for steatosis. There is some fat sparing near the gallbladder. Again noted is a dense nodular structure along the medial aspect of the gallbladder wall measuring up to 1.2 cm on sequence 2, image 32. There has been a similar structure in this area dating back to 02/07/2006. There is no evidence for gallbladder distension. Pancreas: Normal appearance of the pancreas without inflammation or duct dilatation. Spleen: Normal appearance of spleen without enlargement Adrenals/Urinary Tract: Normal adrenal glands. 2-3 small stones in left kidney, largest measuring 5 mm in lower pole. Negative for hydronephrosis. Few punctate calcifications in the right kidney without hydronephrosis. The right kidney punctate structures may be in the medullary regions. Normal appearance the urinary bladder without stones Stomach/Bowel: Normal appearance of stomach, small bowel and colon. Normal appendix. Vascular/Lymphatic: No suspicious lymphadenopathy. Normal caliber of the abdominal aorta. Reproductive: Few calcifications in the prostate. Normal appearance of seminal vesicles. Other: No free fluid.  No free air. Musculoskeletal: No acute abnormality. IMPRESSION: Nephrolithiasis without hydronephrosis.  No ureter stones. Chronic nodular structure along the medial  gallbladder wall. This has not significantly changed since 2007 and suggestive for a benign etiology. Hepatic steatosis. Electronically Signed   By: Richarda Overlie M.D.   On: 06/17/2015 20:02    2030:  Workup reassuring. UC from previous ED visit with insignificant growth. No UTI on Udip today; UC obtained and is pending. Long hx of chronic pain with multiple ED visits for same.  Pt endorses acute flair of his usual long standing chronic pain today, no change from his usual chronic pain  pattern.  Pt encouraged to f/u with his PMD, Uro MD, and Pain Management doctor for good continuity of care and control of his chronic pain.  Pt verb understanding.     Samuel Jester, DO 06/19/15 [phone]

## 2015-06-17 NOTE — ED Notes (Signed)
PT c/o continued left flank pain and nausea since 06/11/15 when diagnosis with UTI and still on antibiotic. PT stated unable to see urologist till June.

## 2015-06-17 NOTE — Discharge Instructions (Signed)
Take the prescriptions as directed.  Apply moist heat or ice to the area(s) of discomfort, for 15 minutes at a time, several times per day for the next few days.  Do not fall asleep on a heating or ice pack.  Call your regular medical doctor, the Pain Management doctor, and your Urologist tomorrow to schedule a follow up appointment this week.  Return to the Emergency Department immediately if worsening.

## 2015-06-19 LAB — URINE CULTURE: CULTURE: NO GROWTH

## 2015-07-10 ENCOUNTER — Ambulatory Visit: Payer: BLUE CROSS/BLUE SHIELD | Admitting: Urology

## 2015-08-21 ENCOUNTER — Emergency Department (HOSPITAL_COMMUNITY)
Admission: EM | Admit: 2015-08-21 | Discharge: 2015-08-21 | Disposition: A | Discharge status: Home / Self Care | Payer: BLUE CROSS/BLUE SHIELD | Attending: Emergency Medicine | Admitting: Emergency Medicine

## 2015-08-21 ENCOUNTER — Encounter (HOSPITAL_COMMUNITY): Payer: Self-pay | Admitting: Emergency Medicine

## 2015-08-21 DIAGNOSIS — R319 Hematuria, unspecified: Secondary | ICD-10-CM | POA: Diagnosis present

## 2015-08-21 DIAGNOSIS — R1032 Left lower quadrant pain: Secondary | ICD-10-CM

## 2015-08-21 DIAGNOSIS — Z791 Long term (current) use of non-steroidal anti-inflammatories (NSAID): Secondary | ICD-10-CM | POA: Diagnosis not present

## 2015-08-21 DIAGNOSIS — Z79899 Other long term (current) drug therapy: Secondary | ICD-10-CM | POA: Diagnosis not present

## 2015-08-21 DIAGNOSIS — F17210 Nicotine dependence, cigarettes, uncomplicated: Secondary | ICD-10-CM | POA: Diagnosis not present

## 2015-08-21 LAB — URINALYSIS, ROUTINE W REFLEX MICROSCOPIC
BILIRUBIN URINE: NEGATIVE
GLUCOSE, UA: NEGATIVE mg/dL
KETONES UR: NEGATIVE mg/dL
Leukocytes, UA: NEGATIVE
Nitrite: NEGATIVE
Specific Gravity, Urine: >1.03 — ABNORMAL HIGH (ref 1.005–1.030)
pH: 6 (ref 5.0–8.0)

## 2015-08-21 LAB — URINE MICROSCOPIC-ADD ON

## 2015-08-21 MED ORDER — NAPROXEN 500 MG PO TABS
ORAL_TABLET | ORAL | Status: DC
Start: 2015-08-21 — End: 2015-10-10

## 2015-08-21 MED ORDER — CYCLOBENZAPRINE HCL 5 MG PO TABS: 5.0000 mg | ORAL_TABLET | Freq: Three times a day (TID) | ORAL | Status: DC | PRN

## 2015-08-21 MED ORDER — KETOROLAC TROMETHAMINE 60 MG/2ML IM SOLN
60.0000 mg | Freq: Once | INTRAMUSCULAR | Status: AC
  Administered 2015-08-21: 60 mg via INTRAMUSCULAR
  Filled 2015-08-21: qty 2

## 2015-08-21 MED ORDER — FENTANYL CITRATE (PF) 100 MCG/2ML IJ SOLN
50.0000 ug | Freq: Once | INTRAMUSCULAR | Status: AC
  Administered 2015-08-21: 50 ug via INTRAVENOUS
  Filled 2015-08-21: qty 2

## 2015-08-21 MED ORDER — CYCLOBENZAPRINE HCL 10 MG PO TABS
10.0000 mg | ORAL_TABLET | Freq: Once | ORAL | Status: AC
  Administered 2015-08-21: 10 mg via ORAL
  Filled 2015-08-21: qty 1

## 2015-08-21 MED ORDER — SODIUM CHLORIDE 0.9 % IV BOLUS (SEPSIS)
1000.0000 mL | Freq: Once | INTRAVENOUS | Status: AC
  Administered 2015-08-21: 1000 mL via INTRAVENOUS

## 2015-08-21 NOTE — ED Provider Notes (Signed)
CSN: [id number]     Arrival date & time 08/21/15  0212 History   First MD Initiated Contact with Patient 08/21/15 0255 AM    Chief Complaint  Patient presents with  . Hematuria     (Consider location/radiation/quality/duration/timing/severity/associated sxs/prior Treatment) HPI patient reports he has a history of kidney stones. He states about 8 PM he felt the need to constantly urinate. At 9 PM he started noticing some blood in the urine. He describes some sharp left lower quadrant pain and denies any left flank are at upper abdominal pain. He states he has nausea without vomiting. He states moving around makes the pain worse, sitting still makes the pain better. He denies any fever.   PCP none [facility]  Past Medical History  Diagnosis Date  . Strep throat   . Kidney stone   . Chronic flank pain    Past Surgical History  Procedure Laterality Date  . Lithotripsy     Family History  Problem Relation Age of Onset  . Diabetes Father    Social History  Substance Use Topics  . Smoking status: Current Every Day Smoker -- 1.00 packs/day    Types: Cigarettes  . Smokeless tobacco: Never Used  . Alcohol Use: No    Review of Systems  All other systems reviewed and are negative.     Allergies  Review of patient's allergies indicates no known allergies.  Home Medications   Prior to Admission medications   Medication Sig Start Date End Date Taking? Authorizing Provider  cephALEXin (KEFLEX) 500 MG capsule Take 1 capsule (500 mg total) by mouth 4 (four) times daily. 06/12/15   Kristen N Ward, DO  cyclobenzaprine (FLEXERIL) 5 MG tablet Take 1 tablet (5 mg total) by mouth 3 (three) times daily as needed. 08/21/15   Devoria Albe, MD  ibuprofen (ADVIL,MOTRIN) 800 MG tablet Take 1 tablet (800 mg total) by mouth every 8 (eight) hours as needed for mild pain. 06/12/15   Kristen N Ward, DO  methocarbamol (ROBAXIN) 500 MG tablet Take 2 tablets (1,000 mg total) by mouth 4 (four) times  daily as needed for muscle spasms (muscle spasm/pain). 06/17/15   Samuel Jester, DO  naproxen (NAPROSYN) 500 MG tablet Take 1 po BID with food prn pain 08/21/15   Devoria Albe, MD  ondansetron (ZOFRAN ODT) 4 MG disintegrating tablet Take 1 tablet (4 mg total) by mouth every 8 (eight) hours as needed for nausea or vomiting. 06/12/15   Kristen N Ward, DO  ondansetron (ZOFRAN) 4 MG tablet Take 1 tablet (4 mg total) by mouth every 8 (eight) hours as needed for nausea or vomiting. 06/17/15   Samuel Jester, DO  oxyCODONE-acetaminophen (PERCOCET/ROXICET) 5-325 MG tablet Take 1 tablet by mouth every 4 (four) hours as needed. 06/12/15   Kristen N Ward, DO  tamsulosin (FLOMAX) 0.4 MG CAPS capsule Take 1 capsule (0.4 mg total) by mouth daily. 06/12/15   Kristen N Ward, DO   BP 139/96 mmHg  Pulse 96  Temp(Src) 98.1 F (36.7 C)  Resp 20  Ht 5\' 11"  (1.803 m)  Wt 225 lb (102.059 kg)  BMI 31.39 kg/m2  SpO2 97%  Vital signs normal   Physical Exam  Constitutional: He is oriented to person, place, and time. He appears well-developed and well-nourished.  Non-toxic appearance. He does not appear ill. No distress.  HENT:  Head: Normocephalic and atraumatic.  Right Ear: External ear normal.  Left Ear: External ear normal.  Nose: Nose normal. No mucosal edema  or rhinorrhea.  Mouth/Throat: Mucous membranes are normal. No dental abscesses or uvula swelling.  Eyes: Conjunctivae and EOM are normal. Pupils are equal, round, and reactive to light.  Neck: Normal range of motion and full passive range of motion without pain. Neck supple.  Cardiovascular: Normal rate, regular rhythm and normal heart sounds.  Exam reveals no gallop and no friction rub.   No murmur heard. Pulmonary/Chest: Effort normal and breath sounds normal. No respiratory distress. He has no wheezes. He has no rhonchi. He has no rales. He exhibits no tenderness and no crepitus.  Abdominal: Soft. Normal appearance and bowel sounds are normal. He exhibits no  distension. There is no tenderness. There is no rebound and no guarding.  Musculoskeletal: Normal range of motion. He exhibits no edema or tenderness.  Moves all extremities well.   Neurological: He is alert and oriented to person, place, and time. He has normal strength. No cranial nerve deficit.  Skin: Skin is warm, dry and intact. No rash noted. No erythema. No pallor.  Psychiatric: He has a normal mood and affect. His speech is normal and behavior is normal. His mood appears not anxious.  Nursing note and vitals reviewed.   ED Course  Procedures (including critical care time)  Medications  ketorolac (TORADOL) injection 60 mg (60 mg Intramuscular Given 08/21/15 0317)  sodium chloride 0.9 % bolus 1,000 mL (0 mLs Intravenous Stopped 08/21/15 0512)  cyclobenzaprine (FLEXERIL) tablet 10 mg (10 mg Oral Given 08/21/15 0412)  fentaNYL (SUBLIMAZE) injection 50 mcg (50 mcg Intravenous Given 08/21/15 0512)    Patient has some urine in the collection bottle in the room. It is not grossly bloody.  Review of patient's prior chart shows he had lithotripsy in April. Patient has frequent ED visits for left flank pain. He has had several CT scans done. His last CT scan in May he had no ureteral stone and statest he was having symptoms of passing a stone then. At this point patient sits quietly in the stretcher even before pain medication he does not appear to be painful like renal stones normally are. I suspect to with the pain on movement this is going to be more musculoskeletal.    Labs Review Results for orders placed or performed during the hospital encounter of 08/21/15  Urinalysis, Routine w reflex microscopic  Result Value Ref Range   Color, Urine YELLOW YELLOW   APPearance CLEAR CLEAR   Specific Gravity, Urine >1.030 (H) 1.005 - 1.030   pH 6.0 5.0 - 8.0   Glucose, UA NEGATIVE NEGATIVE mg/dL   Hgb urine dipstick LARGE (A) NEGATIVE   Bilirubin Urine NEGATIVE NEGATIVE   Ketones, ur NEGATIVE  NEGATIVE mg/dL   Protein, ur TRACE (A) NEGATIVE mg/dL   Nitrite NEGATIVE NEGATIVE   Leukocytes, UA NEGATIVE NEGATIVE  Urine microscopic-add on  Result Value Ref Range   Squamous Epithelial / LPF 0-5 (A) NONE SEEN   WBC, UA 0-5 0 - 5 WBC/hpf   RBC / HPF TOO NUMEROUS TO COUNT 0 - 5 RBC/hpf   Bacteria, UA FEW (A) NONE SEEN   Crystals CA OXALATE CRYSTALS (A) NEGATIVE   Urine-Other MUCOUS PRESENT    Laboratory interpretation all normal except persistent hematuria     Imaging Review No results found.   [facility] CT scan Jun 17, 2015 CLINICAL DATA: Flank pain that started 3-4 days ago. History of stones.  EXAM: CT ABDOMEN AND PELVIS WITHOUT CONTRAST  TECHNIQUE: Multidetector CT imaging of the abdomen and pelvis was  performed following the standard protocol without IV contrast.  COMPARISON: 04/19/2015  FINDINGS  Adrenals/Urinary Tract: Normal adrenal glands. 2-3 small stones in left kidney, largest measuring 5 mm in lower pole. Negative for hydronephrosis. Few punctate calcifications in the right kidney without hydronephrosis. The right kidney punctate structures may be in the medullary regions. Normal appearance the urinary bladder without stones  IMPRESSION: Nephrolithiasis without hydronephrosis. No ureter stones.  Chronic nodular structure along the medial gallbladder wall. This has not significantly changed since 2007 and suggestive for a benign etiology.  Hepatic steatosis.   Electronically Signed  By: Richarda Overlie M.D.  On: 06/17/2015 20:02       I have personally reviewed and evaluated these images and lab results as part of my medical decision-making.    MDM   Final diagnoses:  LLQ pain    New Prescriptions   CYCLOBENZAPRINE (FLEXERIL) 5 MG TABLET    Take 1 tablet (5 mg total) by mouth 3 (three) times daily as needed.   NAPROXEN (NAPROSYN) 500 MG TABLET    Take 1 po BID with food prn pain    Plan discharge  Devoria Albe, MD,  Concha Pyo, MD 08/21/15 [phone]

## 2015-08-21 NOTE — Discharge Instructions (Signed)
Take the medications as prescribed. Recheck with [facility] if your pain continues.    Flank Pain Flank pain refers to pain that is located on the side of the body between the upper abdomen and the back. The pain may occur over a short period of time (acute) or may be long-term or reoccurring (chronic). It may be mild or severe. Flank pain can be caused by many things. CAUSES  Some of the more common causes of flank pain include:  Muscle strains.   Muscle spasms.   A disease of your spine (vertebral disk disease).   A lung infection (pneumonia).   Fluid around your lungs (pulmonary edema).   A kidney infection.   Kidney stones.   A very painful skin rash caused by the chickenpox virus (shingles).   Gallbladder disease.  HOME CARE INSTRUCTIONS  Home care will depend on the cause of your pain. In general,  Rest as directed by your caregiver.  Drink enough fluids to keep your urine clear or pale yellow.  Only take over-the-counter or prescription medicines as directed by your caregiver. Some medicines may help relieve the pain.  Tell your caregiver about any changes in your pain.  Follow up with your caregiver as directed. SEEK IMMEDIATE MEDICAL CARE IF:   Your pain is not controlled with medicine.   You have new or worsening symptoms.  Your pain increases.   You have abdominal pain.   You have shortness of breath.   You have persistent nausea or vomiting.   You have swelling in your abdomen.   You feel faint or pass out.   You have blood in your urine.  You have a fever or persistent symptoms for more than 2-3 days.  You have a fever and your symptoms suddenly get worse. MAKE SURE YOU:   Understand these instructions.  Will watch your condition.  Will get help right away if you are not doing well or get worse.   This information is not intended to replace advice given to you by your health care provider. Make sure you discuss any  questions you have with your health care provider.   Document Released: 03/19/2005 Document Revised: 10/21/2011 Document Reviewed: 09/10/2011 Elsevier Interactive Patient Education [employer]2016 [employer] Inc[employer]

## 2015-08-21 NOTE — ED Notes (Signed)
Pt states he has been having urinary urgency and hematuria with groin pain that started tonight.

## 2015-10-10 ENCOUNTER — Emergency Department (HOSPITAL_COMMUNITY)
Admission: EM | Admit: 2015-10-10 | Discharge: 2015-10-10 | Disposition: A | Discharge status: Home / Self Care | Payer: BLUE CROSS/BLUE SHIELD | Attending: Emergency Medicine | Admitting: Emergency Medicine

## 2015-10-10 ENCOUNTER — Encounter (HOSPITAL_COMMUNITY): Payer: Self-pay | Admitting: Emergency Medicine

## 2015-10-10 ENCOUNTER — Emergency Department (HOSPITAL_COMMUNITY): Payer: BLUE CROSS/BLUE SHIELD

## 2015-10-10 DIAGNOSIS — Z792 Long term (current) use of antibiotics: Secondary | ICD-10-CM | POA: Insufficient documentation

## 2015-10-10 DIAGNOSIS — F17210 Nicotine dependence, cigarettes, uncomplicated: Secondary | ICD-10-CM | POA: Diagnosis not present

## 2015-10-10 DIAGNOSIS — J209 Acute bronchitis, unspecified: Secondary | ICD-10-CM

## 2015-10-10 DIAGNOSIS — R05 Cough: Secondary | ICD-10-CM | POA: Diagnosis present

## 2015-10-10 DIAGNOSIS — Z79899 Other long term (current) drug therapy: Secondary | ICD-10-CM | POA: Insufficient documentation

## 2015-10-10 MED ORDER — ALBUTEROL (5 MG/ML) CONTINUOUS INHALATION SOLN
10.0000 mg/h | INHALATION_SOLUTION | Freq: Once | RESPIRATORY_TRACT | Status: AC
  Administered 2015-10-10: 10 mg/h via RESPIRATORY_TRACT
  Filled 2015-10-10: qty 20

## 2015-10-10 MED ORDER — ALBUTEROL SULFATE HFA 108 (90 BASE) MCG/ACT IN AERS: 1.0000 | INHALATION_SPRAY | Freq: Four times a day (QID) | RESPIRATORY_TRACT | 0 refills | Status: DC | PRN

## 2015-10-10 MED ORDER — PREDNISONE 50 MG PO TABS
ORAL_TABLET | ORAL | 0 refills | Status: DC
Start: 2015-10-11 — End: 2015-11-25

## 2015-10-10 MED ORDER — IPRATROPIUM-ALBUTEROL 0.5-2.5 (3) MG/3ML IN SOLN
3.0000 mL | Freq: Once | RESPIRATORY_TRACT | Status: AC
  Administered 2015-10-10: 3 mL via RESPIRATORY_TRACT
  Filled 2015-10-10: qty 3

## 2015-10-10 MED ORDER — PREDNISONE 50 MG PO TABS
60.0000 mg | ORAL_TABLET | Freq: Once | ORAL | Status: AC
  Administered 2015-10-10: 60 mg via ORAL
  Filled 2015-10-10: qty 1

## 2015-10-10 MED ORDER — ALBUTEROL SULFATE (2.5 MG/3ML) 0.083% IN NEBU
2.5000 mg | INHALATION_SOLUTION | Freq: Once | RESPIRATORY_TRACT | Status: AC
  Administered 2015-10-10: 2.5 mg via RESPIRATORY_TRACT
  Filled 2015-10-10: qty 3

## 2015-10-10 MED ORDER — AZITHROMYCIN 250 MG PO TABS: ORAL_TABLET | ORAL | 0 refills | Status: DC

## 2015-10-10 NOTE — ED Notes (Signed)
Pt ambulated independently in around nurse's station x 1. O2 saturation 92%-94% on room air. Pt denies sob. Nad noted.

## 2015-10-10 NOTE — ED Provider Notes (Signed)
[facility] DEPT Provider Note   CSN: [id number] Arrival date & time: 10/10/15  4098     History   Chief Complaint Chief Complaint  Patient presents with  . URI    HPI Dale Martinez is a 30 y.o. male with no significant past medical history but is a 1 ppd smoker presenting with URI type symptoms 1 week including nasal congestion with clear rhinorrhea, cough which has progressed to shortness of breath with wheezing in addition to a yellow to brown tinged occasional sputum production.  He has had episodes of chills, no documented fevers.  He has used DayQuil, NyQuil, Robitussin and Mucinex with no significant symptom relief.  He denies chest pain, nausea, vomiting or abdominal pain.  Additionally denies ear pain, headache, facial pain.  He endorses occasional episodes of bronchitis.  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  The history is provided by the patient.    Past Medical History:  Diagnosis Date  . Chronic flank pain   . Kidney stone   . Strep throat     There are no active problems to display for this patient.   Past Surgical History:  Procedure Laterality Date  . LITHOTRIPSY         Home Medications    Prior to Admission medications   Medication Sig Start Date End Date Taking? Authorizing Provider  guaiFENesin (MUCINEX) 600 MG 12 hr tablet Take 600 mg by mouth daily as needed for cough.   Yes Historical Provider, MD  Pseudoephedrine-APAP-DM (DAYQUIL MULTI-SYMPTOM PO) Take 2 capsules by mouth daily.   Yes Historical Provider, MD  albuterol (PROVENTIL HFA;VENTOLIN HFA) 108 (90 Base) MCG/ACT inhaler Inhale 1-2 puffs into the lungs every 6 (six) hours as needed for wheezing or shortness of breath. Dispense with spacer 10/10/15   Burgess Amor, PA-C  azithromycin (ZITHROMAX Z-PAK) 250 MG tablet Take 2 tablets by mouth on day one followed by one tablet daily for 4 days. 10/10/15   Burgess Amor, PA-C  predniSONE (DELTASONE) 50 MG  tablet Take one tablet daily for 4 days 10/11/15   Burgess Amor, PA-C    Family History Family History  Problem Relation Age of Onset  . Diabetes Father     Social History Social History  Substance Use Topics  . Smoking status: Current Every Day Smoker    Packs/day: 1.00    Types: Cigarettes  . Smokeless tobacco: Never Used  . Alcohol use No     Allergies   Review of patient's allergies indicates no known allergies.   Review of Systems Review of Systems  Constitutional: Positive for chills. Negative for fever.  HENT: Positive for congestion and rhinorrhea. Negative for sore throat.   Eyes: Negative.   Respiratory: Positive for chest tightness, shortness of breath and wheezing.   Cardiovascular: Negative for chest pain, palpitations and leg swelling.  Gastrointestinal: Negative for abdominal pain and nausea.  Genitourinary: Negative.   Musculoskeletal: Negative for arthralgias, joint swelling and neck pain.  Skin: Negative.  Negative for rash and wound.  Neurological: Negative for dizziness, weakness, light-headedness, numbness and headaches.  Psychiatric/Behavioral: Negative.      Physical Exam Updated Vital Signs BP 152/78   Pulse 114   Temp 98.1 F (36.7 C)   Resp 18   Ht 5\' 11"  (1.803 m)   Wt 102.1 kg   SpO2 94%   BMI 31.38 kg/m   Physical Exam  Constitutional: He is oriented to person, place, and time.  He appears well-developed and well-nourished.  HENT:  Head: Normocephalic and atraumatic.  Right Ear: Tympanic membrane and ear canal normal.  Left Ear: Tympanic membrane and ear canal normal.  Nose: Mucosal edema and rhinorrhea present.  Mouth/Throat: Uvula is midline, oropharynx is clear and moist and mucous membranes are normal. No oropharyngeal exudate, posterior oropharyngeal edema, posterior oropharyngeal erythema or tonsillar abscesses.  Eyes: Conjunctivae are normal.  Cardiovascular: Normal rate and normal heart sounds.   Pulmonary/Chest: Effort  normal. No accessory muscle usage. No respiratory distress. He has wheezes. He has no rhonchi. He has no rales.  Expiratory wheeze throughout all lung fields.  No retractions, no rhonchi.  Prolonged expirations..  Abdominal: Soft. There is no tenderness.  Musculoskeletal: Normal range of motion.  Neurological: He is alert and oriented to person, place, and time.  Skin: Skin is warm and dry. No rash noted.  Psychiatric: He has a normal mood and affect.     ED Treatments / Results  Labs (all labs ordered are listed, but only abnormal results are displayed) Labs Reviewed - No data to display  EKG  EKG Interpretation None       Radiology Dg Chest 2 View  Result Date: 10/10/2015 CLINICAL DATA:  Cough and wheezing for 1 week EXAM: CHEST  2 VIEW COMPARISON:  February 15, 2014 FINDINGS: Lungs are clear. Heart size and pulmonary vascularity are normal. No adenopathy. No bone lesions. IMPRESSION: No edema or consolidation. Electronically Signed   By: Bretta Bang III M.D.   On: 10/10/2015 09:26    Procedures Procedures (including critical care time)  Medications Ordered in ED Medications  ipratropium-albuterol (DUONEB) 0.5-2.5 (3) MG/3ML nebulizer solution 3 mL (3 mLs Nebulization Given 10/10/15 0927)  albuterol (PROVENTIL) (2.5 MG/3ML) 0.083% nebulizer solution 2.5 mg (2.5 mg Nebulization Given 10/10/15 0927)  predniSONE (DELTASONE) tablet 60 mg (60 mg Oral Given 10/10/15 1001)  albuterol (PROVENTIL,VENTOLIN) solution continuous neb (10 mg/hr Nebulization Given 10/10/15 1038)     Initial Impression / Assessment and Plan / ED Course  I have reviewed the triage vital signs and the nursing notes.  Pertinent labs & imaging results that were available during my care of the patient were reviewed by me and considered in my medical decision making (see chart for details).  Clinical Course    6:03 AM Patient reexamined after albuterol and Atrovent neb given.  He has no significant  improvement in wheezing, still with bilateral expiratory wheeze throughout all lung fields.  He states he feels mildly improved.  His chest received his prednisone by mouth dose.  Will repeat an hour-long neb treatment then reassess.  Re exam after hour long neb. Pt with improved aeration, still with bilateral lower lung field expiratory wheezes, breathing feels much improved. He was ambulated in dept without sob or o2 desaturation.  Pt was prescribed prednisone pulse dosing, albuterol with spacer, zithromax given smoking status. Discussed need for smoking cessation. Advised return here for any worsening sx. Pt agrees with plan.   Final Clinical Impressions(s) / ED Diagnoses   Final diagnoses:  Acute bronchitis, unspecified organism    New Prescriptions Discharge Medication List as of 10/10/2015 12:46 PM    START taking these medications   Details  albuterol (PROVENTIL HFA;VENTOLIN HFA) 108 (90 Base) MCG/ACT inhaler Inhale 1-2 puffs into the lungs every 6 (six) hours as needed for wheezing or shortness of breath. Dispense with spacer, Starting Thu 10/10/2015, Print    azithromycin (ZITHROMAX Z-PAK) 250 MG tablet Take 2 tablets by  mouth on day one followed by one tablet daily for 4 days., Print    predniSONE (DELTASONE) 50 MG tablet Take one tablet daily for 4 days, Print         Burgess AmorJulie Princella Jaskiewicz, PA-C 10/11/15 [id number]    Cathren LaineKevin Steinl, MD 10/15/15 [phone]

## 2015-10-10 NOTE — ED Triage Notes (Signed)
Pt c/o nasal congestion and productive cough x 1 week.

## 2015-10-29 ENCOUNTER — Encounter (HOSPITAL_COMMUNITY): Payer: Self-pay | Admitting: *Deleted

## 2015-10-29 ENCOUNTER — Emergency Department (HOSPITAL_COMMUNITY)
Admission: EM | Admit: 2015-10-29 | Discharge: 2015-10-29 | Disposition: A | Discharge status: Home / Self Care | Payer: BLUE CROSS/BLUE SHIELD | Attending: Emergency Medicine | Admitting: Emergency Medicine

## 2015-10-29 DIAGNOSIS — R319 Hematuria, unspecified: Secondary | ICD-10-CM

## 2015-10-29 DIAGNOSIS — R109 Unspecified abdominal pain: Secondary | ICD-10-CM | POA: Diagnosis present

## 2015-10-29 DIAGNOSIS — F17210 Nicotine dependence, cigarettes, uncomplicated: Secondary | ICD-10-CM | POA: Diagnosis not present

## 2015-10-29 DIAGNOSIS — N390 Urinary tract infection, site not specified: Secondary | ICD-10-CM | POA: Insufficient documentation

## 2015-10-29 DIAGNOSIS — Z87442 Personal history of urinary calculi: Secondary | ICD-10-CM | POA: Diagnosis not present

## 2015-10-29 LAB — URINALYSIS, ROUTINE W REFLEX MICROSCOPIC
Bilirubin Urine: NEGATIVE
GLUCOSE, UA: NEGATIVE mg/dL
Ketones, ur: NEGATIVE mg/dL
Leukocytes, UA: NEGATIVE
Nitrite: NEGATIVE
PH: 6 (ref 5.0–8.0)
Protein, ur: NEGATIVE mg/dL

## 2015-10-29 LAB — CBC WITH DIFFERENTIAL/PLATELET
Basophils Absolute: 0 10*3/uL (ref 0.0–0.1)
Basophils Relative: 0 %
EOS PCT: 4 %
Eosinophils Absolute: 0.5 10*3/uL (ref 0.0–0.7)
HCT: 44.6 % (ref 39.0–52.0)
HEMOGLOBIN: 15 g/dL (ref 13.0–17.0)
LYMPHS ABS: 2.9 10*3/uL (ref 0.7–4.0)
LYMPHS PCT: 20 %
MCH: 30.9 pg (ref 26.0–34.0)
MCHC: 33.6 g/dL (ref 30.0–36.0)
MCV: 92 fL (ref 78.0–100.0)
Monocytes Absolute: 1.4 10*3/uL — ABNORMAL HIGH (ref 0.1–1.0)
Monocytes Relative: 10 %
Neutro Abs: 9.9 10*3/uL — ABNORMAL HIGH (ref 1.7–7.7)
Neutrophils Relative %: 66 %
PLATELETS: 246 10*3/uL (ref 150–400)
RBC: 4.85 MIL/uL (ref 4.22–5.81)
RDW: 13.3 % (ref 11.5–15.5)
WBC: 14.9 10*3/uL — AB (ref 4.0–10.5)

## 2015-10-29 LAB — BASIC METABOLIC PANEL
Anion gap: 2 — ABNORMAL LOW (ref 5–15)
BUN: 15 mg/dL (ref 6–20)
CHLORIDE: 109 mmol/L (ref 101–111)
CO2: 25 mmol/L (ref 22–32)
Calcium: 8.8 mg/dL — ABNORMAL LOW (ref 8.9–10.3)
Creatinine, Ser: 0.92 mg/dL (ref 0.61–1.24)
GFR calc Af Amer: >60 mL/min (ref >60)
GFR calc non Af Amer: >60 mL/min (ref >60)
GLUCOSE: 100 mg/dL — AB (ref 65–99)
POTASSIUM: 4 mmol/L (ref 3.5–5.1)
Sodium: 136 mmol/L (ref 135–145)

## 2015-10-29 LAB — URINE MICROSCOPIC-ADD ON

## 2015-10-29 MED ORDER — ONDANSETRON HCL 4 MG/2ML IJ SOLN
4.0000 mg | Freq: Once | INTRAMUSCULAR | Status: AC
  Administered 2015-10-29: 4 mg via INTRAVENOUS
  Filled 2015-10-29: qty 2

## 2015-10-29 MED ORDER — CEPHALEXIN 500 MG PO CAPS
500.0000 mg | ORAL_CAPSULE | Freq: Four times a day (QID) | ORAL | 0 refills | Status: DC
Start: 2015-10-29 — End: 2015-11-25

## 2015-10-29 MED ORDER — MORPHINE SULFATE (PF) 4 MG/ML IV SOLN
4.0000 mg | Freq: Once | INTRAVENOUS | Status: AC
Start: 2015-10-29 — End: 2015-10-29
  Administered 2015-10-29: 4 mg via INTRAVENOUS
  Filled 2015-10-29: qty 1

## 2015-10-29 MED ORDER — SODIUM CHLORIDE 0.9 % IV SOLN
1000.0000 mL | Freq: Once | INTRAVENOUS | Status: AC
  Administered 2015-10-29: 1000 mL via INTRAVENOUS

## 2015-10-29 MED ORDER — OXYCODONE-ACETAMINOPHEN 5-325 MG PO TABS: 1.0000 | ORAL_TABLET | ORAL | 0 refills | Status: DC | PRN

## 2015-10-29 MED ORDER — SODIUM CHLORIDE 0.9 % IV SOLN
1000.0000 mL | INTRAVENOUS | Status: DC
  Administered 2015-10-29: 1000 mL via INTRAVENOUS

## 2015-10-29 MED ORDER — KETOROLAC TROMETHAMINE 30 MG/ML IJ SOLN
30.0000 mg | Freq: Once | INTRAMUSCULAR | Status: AC
  Administered 2015-10-29: 30 mg via INTRAVENOUS
  Filled 2015-10-29: qty 1

## 2015-10-29 MED ORDER — DEXTROSE 5 % IV SOLN
1.0000 g | Freq: Once | INTRAVENOUS | Status: AC
  Administered 2015-10-29: 1 g via INTRAVENOUS
  Filled 2015-10-29: qty 10

## 2015-10-29 NOTE — ED Triage Notes (Signed)
Pt c/o pain that started x 3 hours ago; pt states the pain starts in his left flank and radiates around to left groin

## 2015-10-29 NOTE — ED Provider Notes (Signed)
[facility] DEPT Provider Note   CSN: [id number] Arrival date & time: 10/29/15  0056     History   Chief Complaint Chief Complaint  Patient presents with  . Flank Pain    HPI Dale Martinez is a 30 y.o. male.He has history of chronic flank pain and kidney stones. This evening, he developed pain in the left flank which has gradually moved to the left suprapubic area. This pain is typical for his kidney stones. He rates pain at 9/10. There is associated nausea and vomiting. He did try to take a dose of ibuprofen, but vomited following taking it. He denies fever or chills. He denies any dysuria.  The history is provided by the patient.  Flank Pain     Past Medical History:  Diagnosis Date  . Chronic flank pain   . Kidney stone   . Strep throat     There are no active problems to display for this patient.   Past Surgical History:  Procedure Laterality Date  . LITHOTRIPSY         Home Medications    Prior to Admission medications   Medication Sig Start Date End Date Taking? Authorizing Provider  albuterol (PROVENTIL HFA;VENTOLIN HFA) 108 (90 Base) MCG/ACT inhaler Inhale 1-2 puffs into the lungs every 6 (six) hours as needed for wheezing or shortness of breath. Dispense with spacer 10/10/15   Burgess AmorJulie Idol, PA-C  azithromycin (ZITHROMAX Z-PAK) 250 MG tablet Take 2 tablets by mouth on day one followed by one tablet daily for 4 days. 10/10/15   Burgess AmorJulie Idol, PA-C  guaiFENesin (MUCINEX) 600 MG 12 hr tablet Take 600 mg by mouth daily as needed for cough.    Historical Provider, MD  predniSONE (DELTASONE) 50 MG tablet Take one tablet daily for 4 days 10/11/15   Burgess AmorJulie Idol, PA-C  Pseudoephedrine-APAP-DM (DAYQUIL MULTI-SYMPTOM PO) Take 2 capsules by mouth daily.    Historical Provider, MD    Family History Family History  Problem Relation Age of Onset  . Diabetes Father     Social History Social History  Substance Use Topics  . Smoking status: Current Every Day Smoker   Packs/day: 1.00    Types: Cigarettes  . Smokeless tobacco: Never Used  . Alcohol use No     Allergies   Review of patient's allergies indicates no known allergies.   Review of Systems Review of Systems  Genitourinary: Positive for flank pain.  All other systems reviewed and are negative.    Physical Exam Updated Vital Signs BP 133/91 (BP Location: Right Arm)   Pulse 101   Temp 97.9 F (36.6 C) (Oral)   Resp 18   Ht 5\' 11"  (1.803 m)   Wt 230 lb (104.3 kg)   SpO2 98%   BMI 32.08 kg/m   Physical Exam  Nursing note and vitals reviewed.  30 year old male, resting comfortably and in no acute distress. Vital signs are significant for borderline tachycardia and borderline hypertension. Oxygen saturation is 98%, which is normal. Head is normocephalic and atraumatic. PERRLA, EOMI. Oropharynx is clear. Neck is nontender and supple without adenopathy or JVD. Back is nontender in the midline. There is moderate left CVA tenderness. Lungs are clear without rales, wheezes, or rhonchi. Chest is nontender. Heart has regular rate and rhythm without murmur. Abdomen is soft, flat, with moderate tenderness in the left suprapubic area. There is no rebound or guarding. There are no masses or hepatosplenomegaly and peristalsis is hypoactive. Extremities have no cyanosis or  edema, full range of motion is present. Skin is warm and dry without rash. Neurologic: Mental status is normal, cranial nerves are intact, there are no motor or sensory deficits.  ED Treatments / Results  Labs (all labs ordered are listed, but only abnormal results are displayed) Labs Reviewed  URINALYSIS, ROUTINE W REFLEX MICROSCOPIC (NOT AT [facility]) - Abnormal; Notable for the following:       Result Value   Specific Gravity, Urine >1.030 (*)    Hgb urine dipstick LARGE (*)    All other components within normal limits  CBC WITH DIFFERENTIAL/PLATELET - Abnormal; Notable for the following:    WBC 14.9 (*)    Neutro Abs  9.9 (*)    Monocytes Absolute 1.4 (*)    All other components within normal limits  BASIC METABOLIC PANEL - Abnormal; Notable for the following:    Glucose, Bld 100 (*)    Calcium 8.8 (*)    Anion gap 2 (*)    All other components within normal limits  URINE MICROSCOPIC-ADD ON - Abnormal; Notable for the following:    Squamous Epithelial / LPF 0-5 (*)    Bacteria, UA MANY (*)    Crystals CA OXALATE CRYSTALS (*)    All other components within normal limits  URINE CULTURE    Procedures Procedures (including critical care time)  Medications Ordered in ED Medications  0.9 %  sodium chloride infusion (0 mLs Intravenous Stopped 10/29/15 0211)    Followed by  0.9 %  sodium chloride infusion (1,000 mLs Intravenous New Bag/Given 10/29/15 0211)  ondansetron (ZOFRAN) injection 4 mg (4 mg Intravenous Given 10/29/15 0152)  ketorolac (TORADOL) 30 MG/ML injection 30 mg (30 mg Intravenous Given 10/29/15 0152)  morphine 4 MG/ML injection 4 mg (4 mg Intravenous Given 10/29/15 0152)  cefTRIAXone (ROCEPHIN) 1 g in dextrose 5 % 50 mL IVPB (0 g Intravenous Stopped 10/29/15 0402)     Initial Impression / Assessment and Plan / ED Course  I have reviewed the triage vital signs and the nursing notes.  Pertinent labs & imaging results that were available during my care of the patient were reviewed by me and considered in my medical decision making (see chart for details).  Clinical Course   Left flank pain consistent with ureteral colic. Old records are reviewed and he did have lithotripsy in April and has numerous ED visits for flank pain. Of note, some of those results have been associated with no finding of ureterolithiasis. Last CT was obtained in May and did show residual 5 mm calculus in the left kidney. He has had 14 CT scans of the abdomen and pelvis. Will try to avoid additional CT scan today. He will be IV fluids, ketorolac, morphine, ondansetron.  Urinalysis does show evidence of infection as well  as hematuria. Is given dose of ceftriaxone. He remained comfortable in the ED. He is discharged with prescriptions for cephalexin and oxycodone-acetaminophen and referred back to his urologist.  Final Clinical Impressions(s) / ED Diagnoses   Final diagnoses:  Left flank pain  Urinary tract infection with hematuria, site unspecified    New Prescriptions New Prescriptions   CEPHALEXIN (KEFLEX) 500 MG CAPSULE    Take 1 capsule (500 mg total) by mouth 4 (four) times daily.   OXYCODONE-ACETAMINOPHEN (PERCOCET/ROXICET) 5-325 MG TABLET    Take 1 tablet by mouth every 4 (four) hours as needed.     Dione Booze, MD 10/29/15 [id number]

## 2015-10-30 LAB — URINE CULTURE: Culture: NO GROWTH

## 2015-11-25 ENCOUNTER — Emergency Department (HOSPITAL_COMMUNITY): Payer: BLUE CROSS/BLUE SHIELD

## 2015-11-25 ENCOUNTER — Emergency Department (HOSPITAL_COMMUNITY)
Admission: EM | Admit: 2015-11-25 | Discharge: 2015-11-25 | Disposition: A | Discharge status: Home / Self Care | Payer: BLUE CROSS/BLUE SHIELD | Attending: Emergency Medicine | Admitting: Emergency Medicine

## 2015-11-25 ENCOUNTER — Encounter (HOSPITAL_COMMUNITY): Payer: Self-pay | Admitting: Emergency Medicine

## 2015-11-25 DIAGNOSIS — R109 Unspecified abdominal pain: Secondary | ICD-10-CM | POA: Diagnosis present

## 2015-11-25 DIAGNOSIS — N200 Calculus of kidney: Secondary | ICD-10-CM | POA: Insufficient documentation

## 2015-11-25 DIAGNOSIS — F17210 Nicotine dependence, cigarettes, uncomplicated: Secondary | ICD-10-CM | POA: Insufficient documentation

## 2015-11-25 DIAGNOSIS — Z79899 Other long term (current) drug therapy: Secondary | ICD-10-CM | POA: Diagnosis not present

## 2015-11-25 LAB — URINE MICROSCOPIC-ADD ON

## 2015-11-25 LAB — CBC WITH DIFFERENTIAL/PLATELET
BASOS PCT: 1 %
Basophils Absolute: 0.2 10*3/uL — ABNORMAL HIGH (ref 0.0–0.1)
EOS PCT: 2 %
Eosinophils Absolute: 0.3 10*3/uL (ref 0.0–0.7)
HEMATOCRIT: 48.3 % (ref 39.0–52.0)
HEMOGLOBIN: 16.5 g/dL (ref 13.0–17.0)
LYMPHS PCT: 19 %
Lymphs Abs: 2.9 10*3/uL (ref 0.7–4.0)
MCH: 31.4 pg (ref 26.0–34.0)
MCHC: 34.2 g/dL (ref 30.0–36.0)
MCV: 92 fL (ref 78.0–100.0)
MONOS PCT: 7 %
Monocytes Absolute: 1.1 10*3/uL — ABNORMAL HIGH (ref 0.1–1.0)
NEUTROS ABS: 10.6 10*3/uL — AB (ref 1.7–7.7)
Neutrophils Relative %: 71 %
Platelets: 241 10*3/uL (ref 150–400)
RBC: 5.25 MIL/uL (ref 4.22–5.81)
RDW: 13.5 % (ref 11.5–15.5)
WBC: 15.1 10*3/uL — ABNORMAL HIGH (ref 4.0–10.5)

## 2015-11-25 LAB — BASIC METABOLIC PANEL
Anion gap: 9 (ref 5–15)
BUN: 14 mg/dL (ref 6–20)
CHLORIDE: 105 mmol/L (ref 101–111)
CO2: 23 mmol/L (ref 22–32)
Calcium: 9.6 mg/dL (ref 8.9–10.3)
Creatinine, Ser: 0.79 mg/dL (ref 0.61–1.24)
GFR calc Af Amer: >60 mL/min (ref >60)
GFR calc non Af Amer: >60 mL/min (ref >60)
Glucose, Bld: 80 mg/dL (ref 65–99)
POTASSIUM: 4.2 mmol/L (ref 3.5–5.1)
SODIUM: 137 mmol/L (ref 135–145)

## 2015-11-25 LAB — URINALYSIS, ROUTINE W REFLEX MICROSCOPIC
Bilirubin Urine: NEGATIVE
Glucose, UA: NEGATIVE mg/dL
Ketones, ur: NEGATIVE mg/dL
Leukocytes, UA: NEGATIVE
Nitrite: NEGATIVE
Protein, ur: NEGATIVE mg/dL
Specific Gravity, Urine: 1.025 (ref 1.005–1.030)
pH: 6 (ref 5.0–8.0)

## 2015-11-25 MED ORDER — TAMSULOSIN HCL 0.4 MG PO CAPS
0.4000 mg | ORAL_CAPSULE | Freq: Once | ORAL | Status: AC
  Administered 2015-11-25: 0.4 mg via ORAL
  Filled 2015-11-25: qty 1

## 2015-11-25 MED ORDER — KETOROLAC TROMETHAMINE 30 MG/ML IJ SOLN
30.0000 mg | Freq: Once | INTRAMUSCULAR | Status: AC
  Administered 2015-11-25: 30 mg via INTRAVENOUS
  Filled 2015-11-25: qty 1

## 2015-11-25 MED ORDER — SODIUM CHLORIDE 0.9 % IV BOLUS (SEPSIS)
1000.0000 mL | Freq: Once | INTRAVENOUS | Status: AC
  Administered 2015-11-25: 1000 mL via INTRAVENOUS

## 2015-11-25 MED ORDER — ONDANSETRON HCL 4 MG/2ML IJ SOLN
4.0000 mg | Freq: Once | INTRAMUSCULAR | Status: AC
  Administered 2015-11-25: 4 mg via INTRAVENOUS
  Filled 2015-11-25: qty 2

## 2015-11-25 MED ORDER — HYDROMORPHONE HCL 1 MG/ML IJ SOLN
1.0000 mg | Freq: Once | INTRAMUSCULAR | Status: AC
  Administered 2015-11-25: 1 mg via INTRAVENOUS
  Filled 2015-11-25: qty 1

## 2015-11-25 MED ORDER — OXYCODONE-ACETAMINOPHEN 5-325 MG PO TABS: 1.0000 | ORAL_TABLET | ORAL | 0 refills | Status: DC | PRN

## 2015-11-25 MED ORDER — TAMSULOSIN HCL 0.4 MG PO CAPS: 0.4000 mg | ORAL_CAPSULE | Freq: Every day | ORAL | 0 refills | Status: DC

## 2015-11-25 MED ORDER — ONDANSETRON HCL 4 MG PO TABS: 4.0000 mg | ORAL_TABLET | Freq: Three times a day (TID) | ORAL | 0 refills | Status: DC | PRN

## 2015-11-25 NOTE — ED Notes (Signed)
Patient transported to Ultrasound 

## 2015-11-25 NOTE — ED Triage Notes (Signed)
Pt reports L sided flank pain, nausea and urinary frequency that started last night.

## 2015-11-25 NOTE — ED Provider Notes (Signed)
[facility] Provider Note   CSN: [id number] Arrival date & time: 11/25/15  1344     History   Chief Complaint Chief Complaint  Patient presents with  . Flank Pain    HPI Dale Martinez is a 30 y.o. male.  H/o kidney stones, feels similar. Has decreased uOP. Some nausea. No fevers.     Flank Pain  This is a recurrent problem. The current episode started 3 to 5 hours ago. The problem occurs constantly. The problem has not changed since onset.Associated symptoms include abdominal pain. Pertinent negatives include no chest pain and no shortness of breath. Nothing aggravates the symptoms. Nothing relieves the symptoms. He has tried nothing for the symptoms. The treatment provided no relief.    Past Medical History:  Diagnosis Date  . Chronic flank pain   . Kidney stone   . Strep throat     There are no active problems to display for this patient.   Past Surgical History:  Procedure Laterality Date  . LITHOTRIPSY         Home Medications    Prior to Admission medications   Medication Sig Start Date End Date Taking? Authorizing Provider  albuterol (PROVENTIL HFA;VENTOLIN HFA) 108 (90 Base) MCG/ACT inhaler Inhale 1-2 puffs into the lungs every 6 (six) hours as needed for wheezing or shortness of breath. Dispense with spacer 10/10/15  Yes Raynelle Fanning Idol, PA-C  ondansetron (ZOFRAN) 4 MG tablet Take 1 tablet (4 mg total) by mouth every 8 (eight) hours as needed for nausea or vomiting. 11/25/15   Marily Memos, MD  oxyCODONE-acetaminophen (PERCOCET/ROXICET) 5-325 MG tablet Take 1 tablet by mouth every 4 (four) hours as needed. 11/25/15   Marily Memos, MD  tamsulosin (FLOMAX) 0.4 MG CAPS capsule Take 1 capsule (0.4 mg total) by mouth daily. 11/25/15   Marily Memos, MD    Family History Family History  Problem Relation Age of Onset  . Diabetes Father     Social History Social History  Substance Use Topics  . Smoking status: Current Every Day Smoker    Packs/day:  1.00    Types: Cigarettes  . Smokeless tobacco: Never Used  . Alcohol use No     Allergies   Review of patient's allergies indicates not on file.   Review of Systems Review of Systems  Respiratory: Negative for shortness of breath.   Cardiovascular: Negative for chest pain.  Gastrointestinal: Positive for abdominal pain.  Genitourinary: Positive for decreased urine volume and flank pain.  All other systems reviewed and are negative.    Physical Exam Updated Vital Signs BP 134/90 (BP Location: Left Arm)   Pulse 78   Temp 98 F (36.7 C) (Oral)   Resp 18   Ht 5\' 11"  (1.803 m)   Wt 230 lb (104.3 kg)   SpO2 98%   BMI 32.08 kg/m   Physical Exam  Constitutional: He appears well-developed and well-nourished.  HENT:  Head: Normocephalic and atraumatic.  Eyes: Conjunctivae and EOM are normal.  Neck: Normal range of motion.  Cardiovascular: Normal rate.   Pulmonary/Chest: Effort normal. No respiratory distress.  Abdominal: He exhibits no distension. There is no guarding.  Musculoskeletal: Normal range of motion.  Mild L cva ttp  Neurological: He is alert.  Skin: Skin is warm and dry.  Nursing note and vitals reviewed.    ED Treatments / Results  Labs (all labs ordered are listed, but only abnormal results are displayed) Labs Reviewed  URINALYSIS, ROUTINE W REFLEX [facility]CROSCOPIC (NOT  [facility]) - Abnormal; Notable for the following:       Result Value   Hgb urine dipstick MODERATE (*)    All other components within normal limits  URINE MICROSCOPIC-ADD ON - Abnormal; Notable for the following:    Squamous Epithelial / LPF 0-5 (*)    Bacteria, UA FEW (*)    All other components within normal limits  CBC WITH DIFFERENTIAL/PLATELET - Abnormal; Notable for the following:    WBC 15.1 (*)    Neutro Abs 10.6 (*)    Monocytes Absolute 1.1 (*)    Basophils Absolute 0.2 (*)    All other components within normal limits  BASIC METABOLIC PANEL    EKG  EKG  Interpretation None       Radiology [place] Renal  Result Date: 11/25/2015 CLINICAL DATA:  Chronic flank pain on the left EXAM: RENAL / URINARY TRACT ULTRASOUND COMPLETE COMPARISON:  06/17/2015 FINDINGS: Right Kidney: Length: 12.0 cm. Echogenic focus is noted in the mid kidney measuring 9 mm consistent with a nonobstructing stone. Left Kidney: Length: 12.3 cm. Scattered small echogenic foci are noted consistent with nonobstructing calculi. These are similar to that seen on prior CT examination. Bladder: Appears normal for degree of bladder distention. IMPRESSION: Bilateral nonobstructing renal calculi. No hydronephrotic change is seen. Electronically Signed   By: Alcide CleverMark  Lukens M.D.   On: 11/25/2015 16:41    Procedures Procedures (including critical care time)  Medications Ordered in ED Medications  HYDROmorphone (DILAUDID) injection 1 mg (1 mg Intravenous Given 11/25/15 1553)  sodium chloride 0.9 % bolus 1,000 mL (0 mLs Intravenous Stopped 11/25/15 1748)  ketorolac (TORADOL) 30 MG/ML injection 30 mg (30 mg Intravenous Given 11/25/15 1554)  ondansetron (ZOFRAN) injection 4 mg (4 mg Intravenous Given 11/25/15 1554)  tamsulosin (FLOMAX) capsule 0.4 mg (0.4 mg Oral Given 11/25/15 1553)     Initial Impression / Assessment and Plan / ED Course  I have reviewed the triage vital signs and the nursing notes.  Pertinent labs & imaging results that were available during my care of the patient were reviewed by me and considered in my medical decision making (see chart for details).  Clinical Course   Long history of kidney stones. Feels similar. already with uro follow up on Thursday. Will US and ensure some evidence of ureterolithiasis and treat accordingly.  US negative, symptoms improved. Possibly passed stone, or too small for hydronephrosis. Already has urology follow up in a couple days and will keep that appointment.   Final Clinical Impressions(s) / ED Diagnoses   Final diagnoses:  Flank  pain  Kidney stone    New Prescriptions Discharge Medication List as of 11/25/2015  5:26 PM    START taking these medications   Details  ondansetron (ZOFRAN) 4 MG tablet Take 1 tablet (4 mg total) by mouth every 8 (eight) hours as needed for nausea or vomiting., Starting Mon 11/25/2015, Print    tamsulosin (FLOMAX) 0.4 MG CAPS capsule Take 1 capsule (0.4 mg total) by mouth daily., Starting Mon 11/25/2015, Print         Marily MemosJason Kier Smead, MD 11/26/15 0000

## 2016-02-16 IMAGING — US US RENAL
1 series · 14 of 25 positions shown · non-contrast
Comparison: Ultrasound December 23, 2011.

CLINICAL DATA: Left flank pain.

EXAM:
RENAL/URINARY TRACT ULTRASOUND COMPLETE

[Series 1: us renal · 0.24mm/px · 14 of 26 slices shown]
[im 1/26]
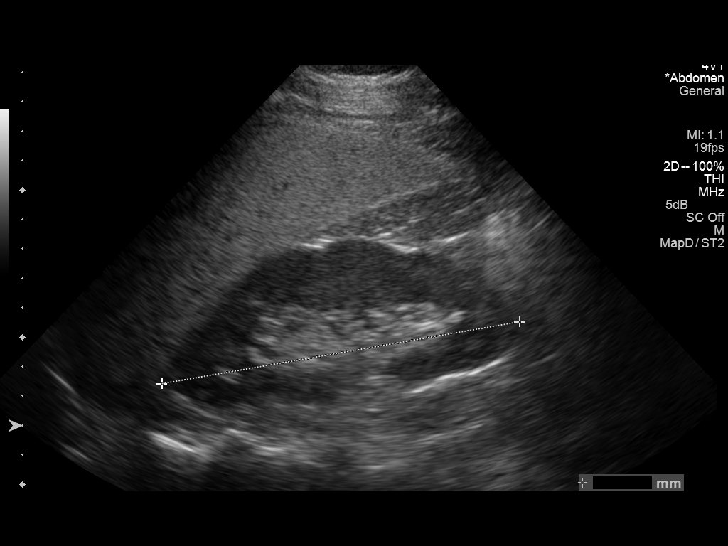
[im 3/26]
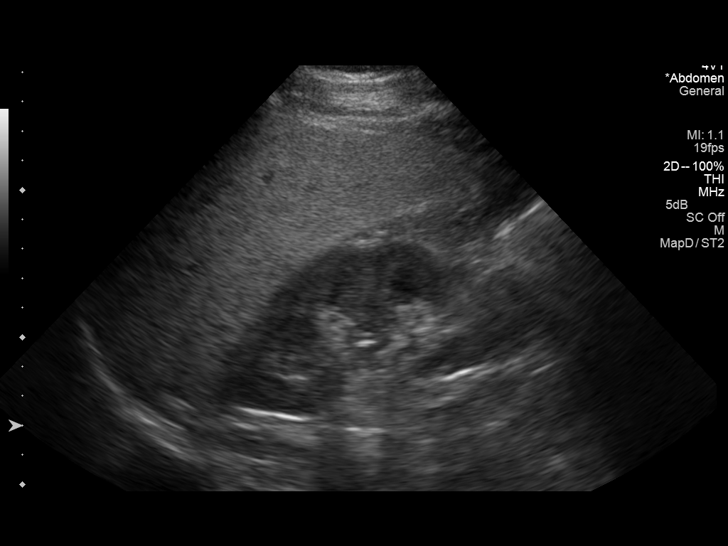
[im 5/26]
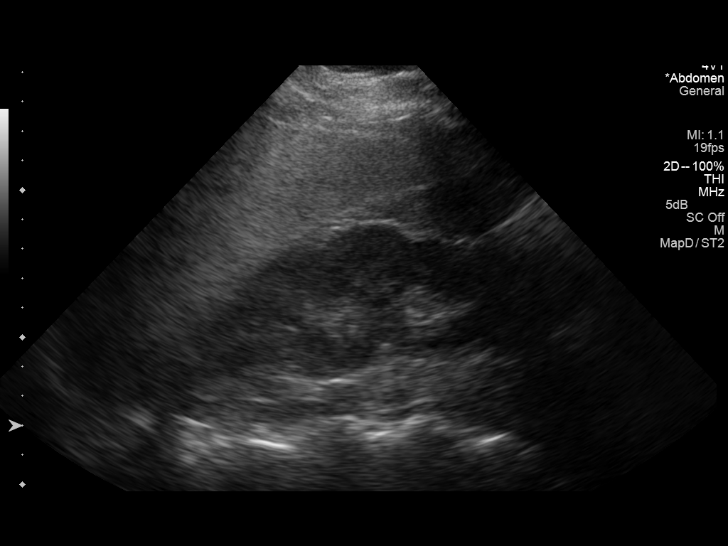
[im 7/26]
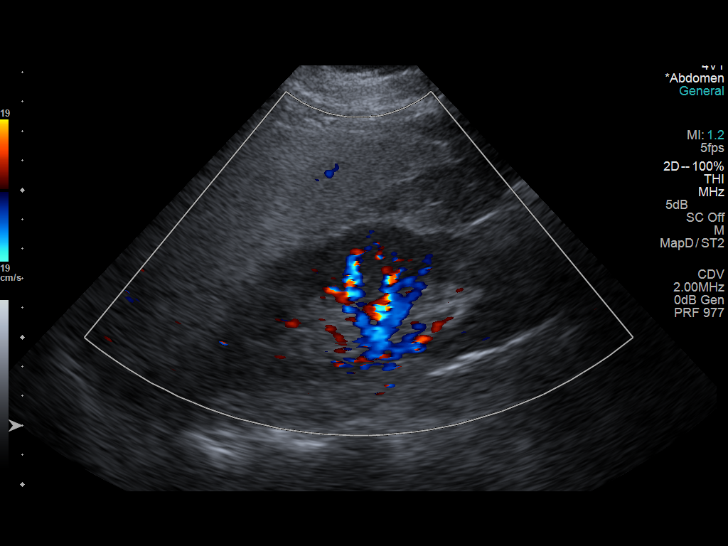
[im 9/26]
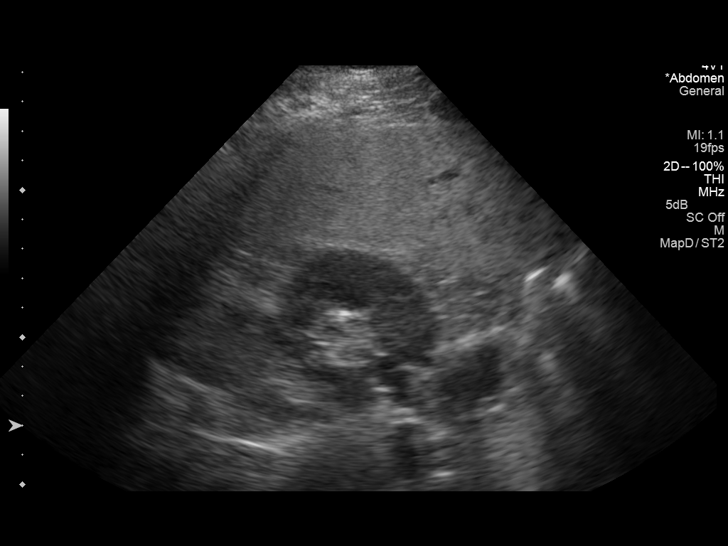
[im 10/26]
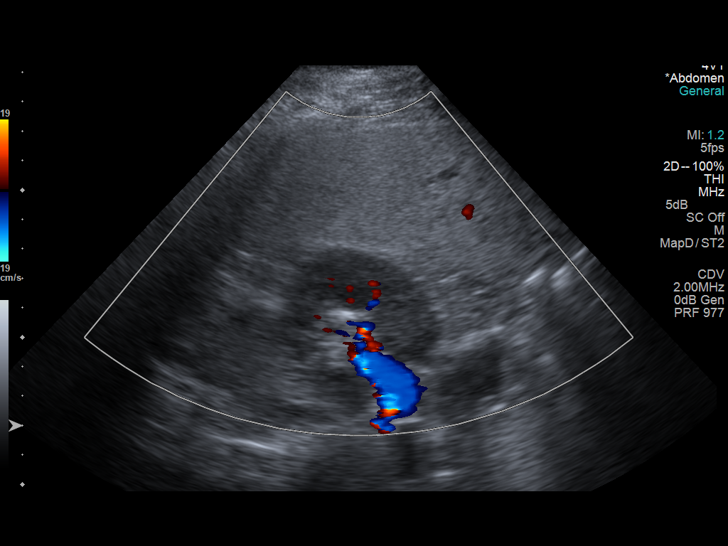
[im 12/26]
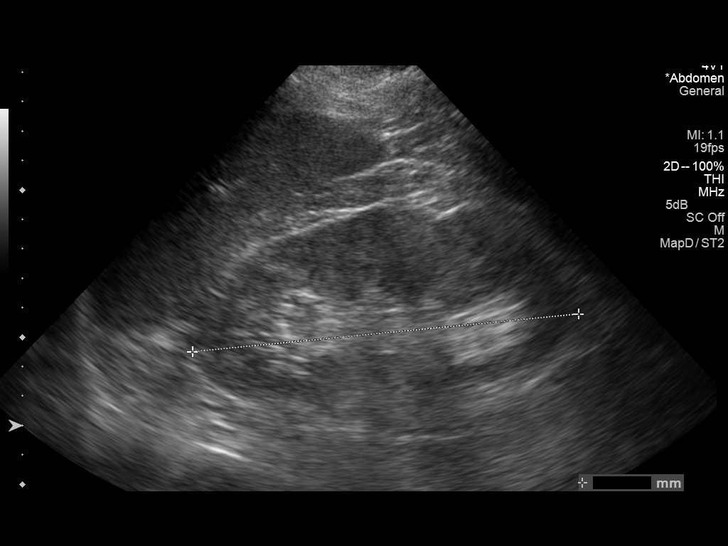
[im 14/26]
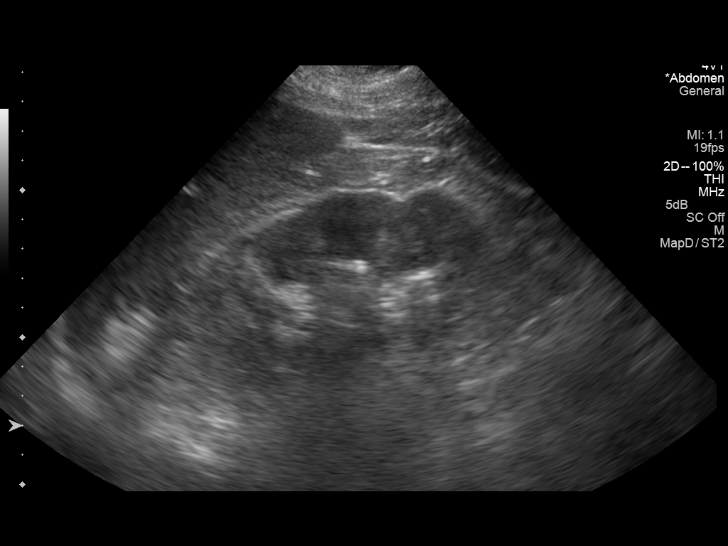
[im 16/26]
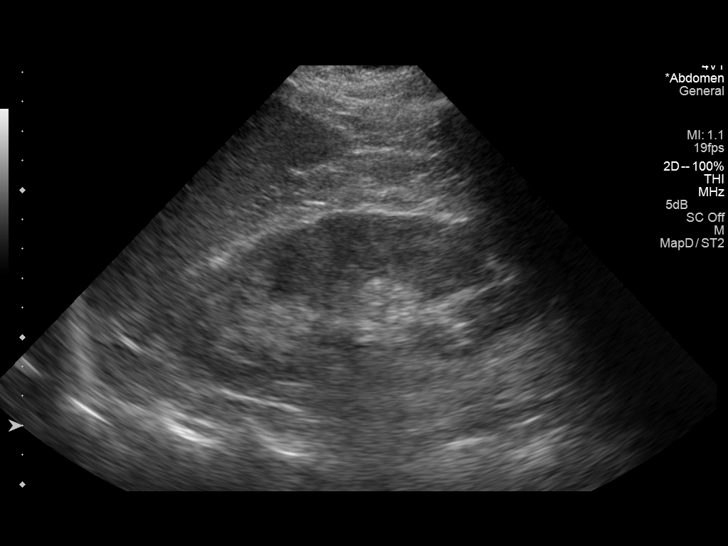
[im 17/26]
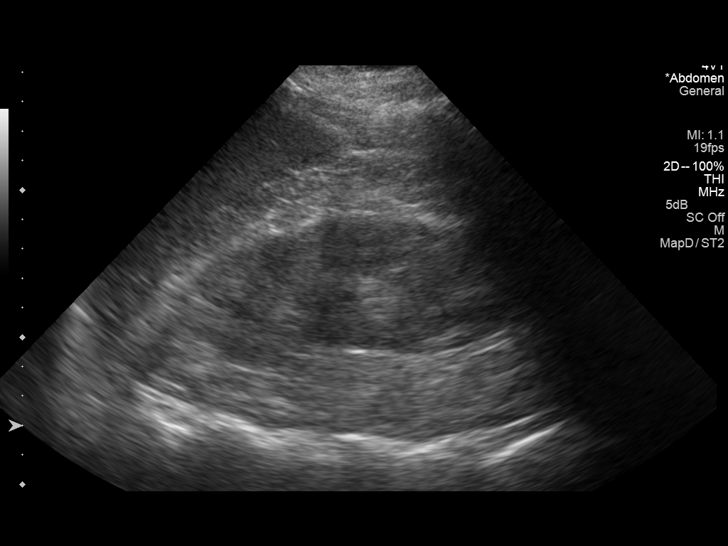
[im 19/26]
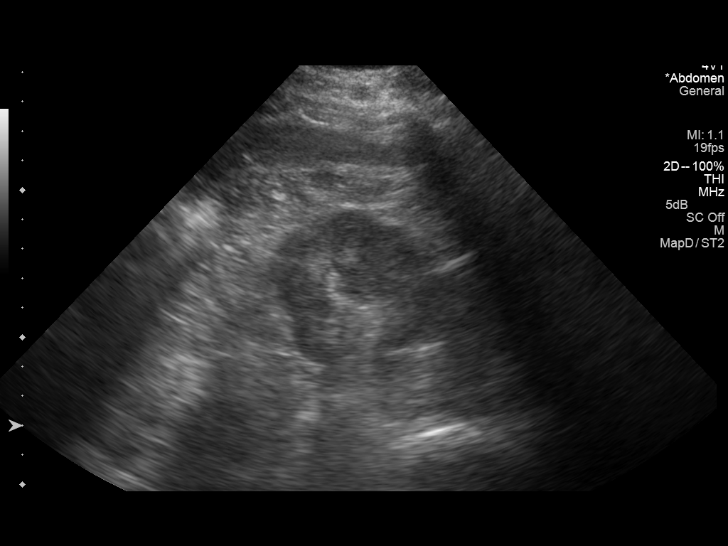
[im 21/26]
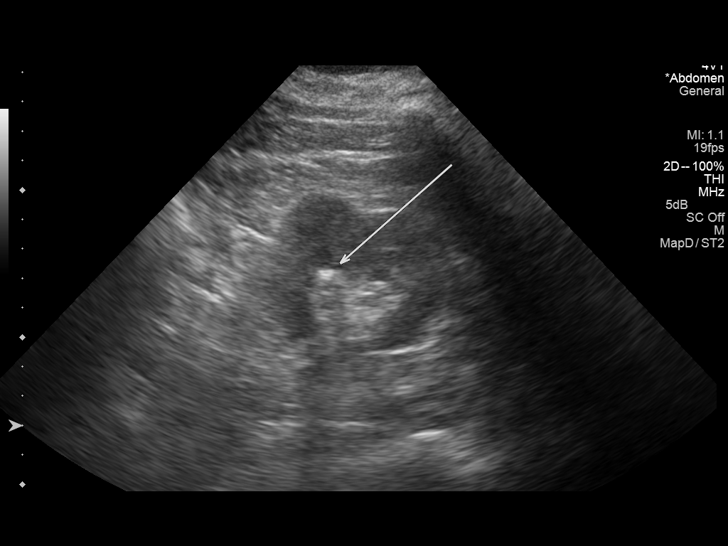
[im 23/26]
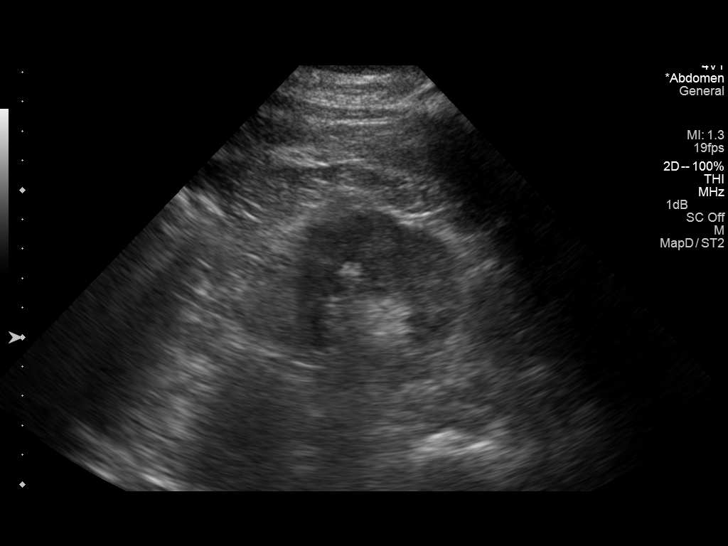
[im 26/26]
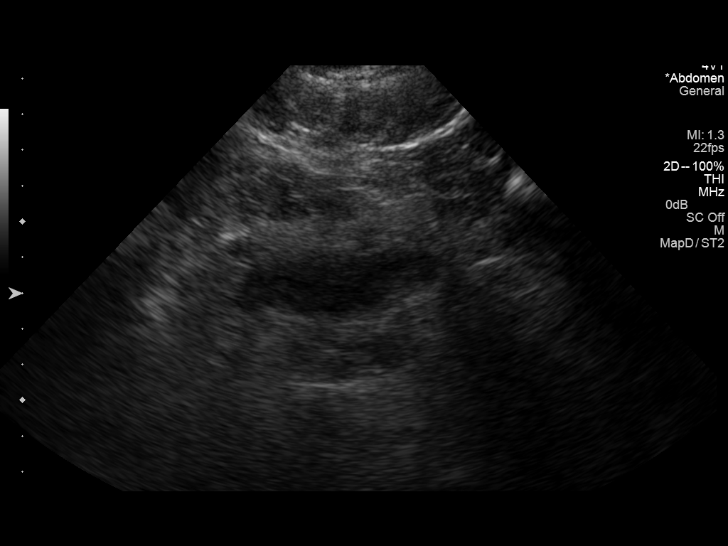

[14 of 25 positions shown; findings below may reference images not displayed]

FINDINGS: Right Kidney:

Length: 12.3 cm. Echogenicity within normal limits. No mass or
hydronephrosis visualized.

Left Kidney:

Length: 13.2 cm. 9 mm nonobstructive calculus is noted in lower pole
collecting system. Echogenicity within normal limits. No mass or
hydronephrosis visualized.

Bladder:

Decompressed as patient has recently voided.
IMPRESSION: 9 mm nonobstructive left renal calculus. No hydronephrosis or renal
obstruction is noted.

## 2016-02-18 IMAGING — CR DG ABDOMEN 1V
2 series · 2 of 2 positions shown · non-contrast
Comparison: None.

CLINICAL DATA: Left flank pain.

EXAM:
ABDOMEN - 1 VIEW

[view not recorded (1 of 2)]
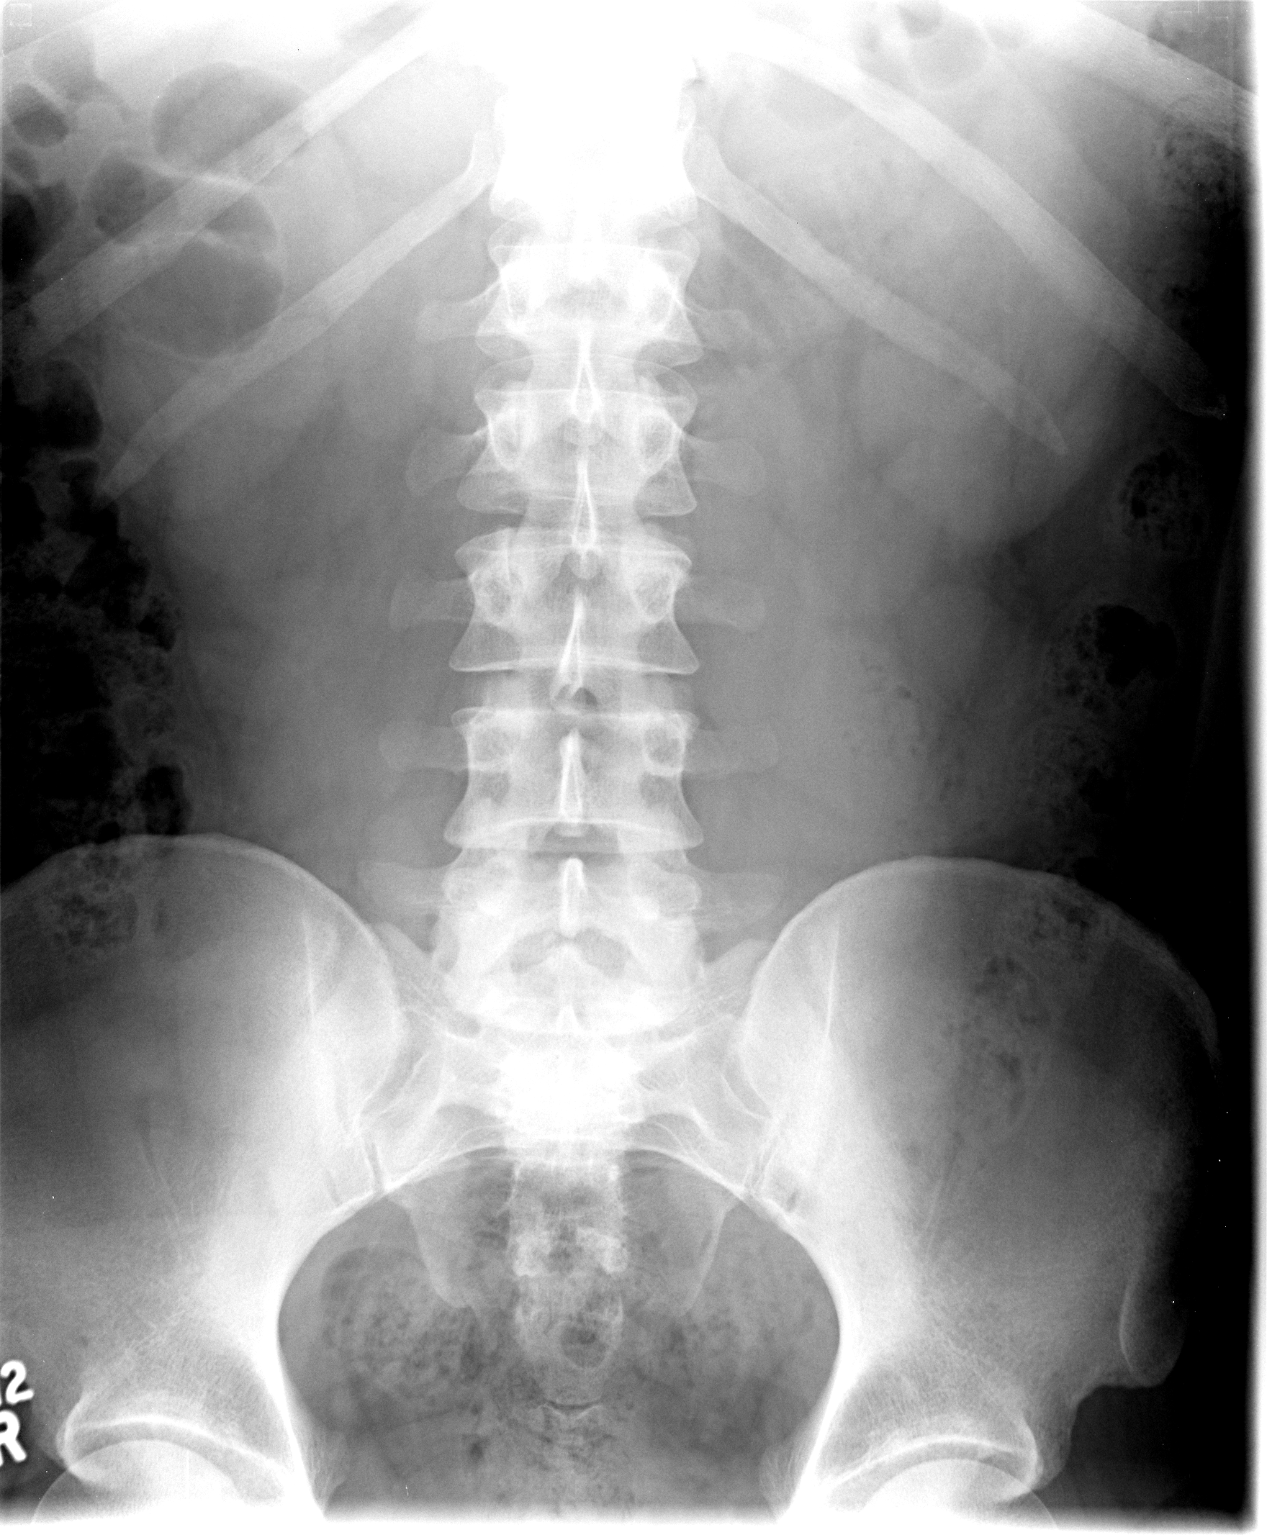

[view not recorded (2 of 2)]
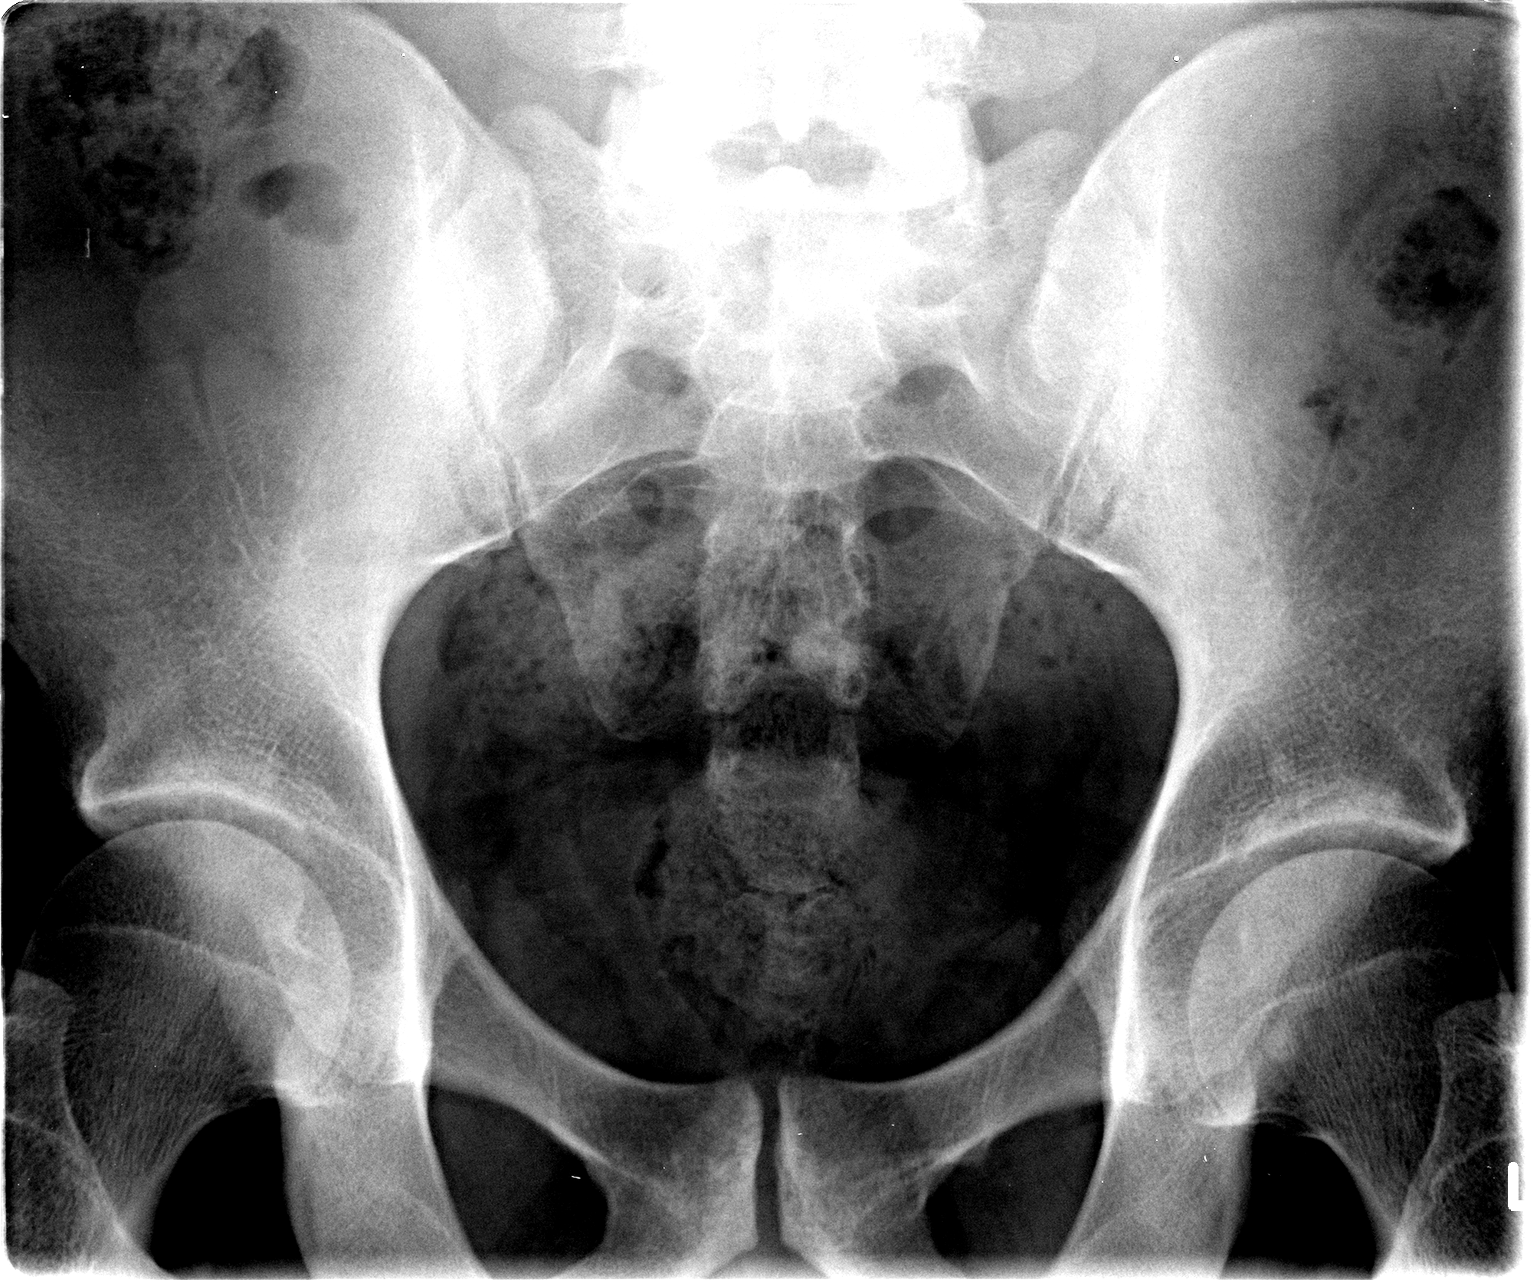

[2 of 2 positions shown; findings below may reference images not displayed]

FINDINGS: The bowel gas pattern is normal. No radio-opaque calculi or other
significant radiographic abnormality are seen.
IMPRESSION: No evidence of bowel obstruction or ileus. No renal calculi are
noted.

## 2016-03-17 ENCOUNTER — Emergency Department (HOSPITAL_COMMUNITY): Payer: BLUE CROSS/BLUE SHIELD

## 2016-03-17 ENCOUNTER — Encounter (HOSPITAL_COMMUNITY): Payer: Self-pay | Admitting: Emergency Medicine

## 2016-03-17 ENCOUNTER — Emergency Department (HOSPITAL_COMMUNITY)
Admission: EM | Admit: 2016-03-17 | Discharge: 2016-03-17 | Disposition: A | Discharge status: Home / Self Care | Payer: BLUE CROSS/BLUE SHIELD | Attending: Emergency Medicine | Admitting: Emergency Medicine

## 2016-03-17 DIAGNOSIS — R109 Unspecified abdominal pain: Secondary | ICD-10-CM | POA: Diagnosis present

## 2016-03-17 DIAGNOSIS — R1032 Left lower quadrant pain: Secondary | ICD-10-CM | POA: Insufficient documentation

## 2016-03-17 DIAGNOSIS — F17210 Nicotine dependence, cigarettes, uncomplicated: Secondary | ICD-10-CM | POA: Diagnosis not present

## 2016-03-17 LAB — URINALYSIS, ROUTINE W REFLEX MICROSCOPIC
BILIRUBIN URINE: NEGATIVE
Glucose, UA: NEGATIVE mg/dL
HGB URINE DIPSTICK: NEGATIVE
Ketones, ur: NEGATIVE mg/dL
Leukocytes, UA: NEGATIVE
NITRITE: NEGATIVE
PROTEIN: NEGATIVE mg/dL
SPECIFIC GRAVITY, URINE: 1.016 (ref 1.005–1.030)
pH: 6 (ref 5.0–8.0)

## 2016-03-17 MED ORDER — SODIUM CHLORIDE 0.9 % IV BOLUS (SEPSIS)
1000.0000 mL | Freq: Once | INTRAVENOUS | Status: AC
  Administered 2016-03-17: 1000 mL via INTRAVENOUS

## 2016-03-17 MED ORDER — MORPHINE SULFATE (PF) 4 MG/ML IV SOLN
4.0000 mg | Freq: Once | INTRAVENOUS | Status: AC
  Administered 2016-03-17: 4 mg via INTRAVENOUS
  Filled 2016-03-17: qty 1

## 2016-03-17 MED ORDER — KETOROLAC TROMETHAMINE 30 MG/ML IJ SOLN
30.0000 mg | Freq: Once | INTRAMUSCULAR | Status: AC
  Administered 2016-03-17: 30 mg via INTRAVENOUS
  Filled 2016-03-17: qty 1

## 2016-03-17 MED ORDER — ONDANSETRON HCL 4 MG/2ML IJ SOLN
4.0000 mg | Freq: Once | INTRAMUSCULAR | Status: AC
  Administered 2016-03-17: 4 mg via INTRAVENOUS
  Filled 2016-03-17: qty 2

## 2016-03-17 MED ORDER — OXYCODONE-ACETAMINOPHEN 5-325 MG PO TABS: 1.0000 | ORAL_TABLET | ORAL | 0 refills | Status: DC | PRN

## 2016-03-17 MED ORDER — ONDANSETRON HCL 8 MG PO TABS: 8.0000 mg | ORAL_TABLET | ORAL | 0 refills | Status: DC | PRN

## 2016-03-17 NOTE — ED Provider Notes (Signed)
[facility] Provider Note   CSN: [id number] Arrival date & time: 03/17/16  1748     History   Chief Complaint Chief Complaint  Patient presents with  . Flank Pain    HPI Everlene FarrierZachary R Nishi is a 31 y.o. male.  Left flank pain with radiation to left lower quadrant starting today approximately 4 PM. Past medical history includes kidney stones and lithotripsy. No dysuria, hematuria, fever, chills, frequency.  He has been eating. Severity of symptoms mild to moderate.      Past Medical History:  Diagnosis Date  . Chronic flank pain   . Kidney stone   . Strep throat     There are no active problems to display for this patient.   Past Surgical History:  Procedure Laterality Date  . LITHOTRIPSY         Home Medications    Prior to Admission medications   Medication Sig Start Date End Date Taking? Authorizing Provider  albuterol (PROVENTIL HFA;VENTOLIN HFA) 108 (90 Base) MCG/ACT inhaler Inhale 1-2 puffs into the lungs every 6 (six) hours as needed for wheezing or shortness of breath. Dispense with spacer 10/10/15  Yes Raynelle FanningJulie Idol, PA-C  ondansetron (ZOFRAN) 8 MG tablet Take 1 tablet (8 mg total) by mouth every 4 (four) hours as needed. 03/17/16   Donnetta HutchingBrian Imer Foxworth, MD  oxyCODONE-acetaminophen (PERCOCET/ROXICET) 5-325 MG tablet Take 1-2 tablets by mouth every 4 (four) hours as needed for severe pain. 03/17/16   Donnetta HutchingBrian Shields Pautz, MD  tamsulosin (FLOMAX) 0.4 MG CAPS capsule Take 1 capsule (0.4 mg total) by mouth daily. Patient not taking: Reported on 03/17/2016 11/25/15   Marily MemosJason Mesner, MD    Family History Family History  Problem Relation Age of Onset  . Diabetes Father     Social History Social History  Substance Use Topics  . Smoking status: Current Every Day Smoker    Packs/day: 1.00    Types: Cigarettes  . Smokeless tobacco: Never Used  . Alcohol use No     Allergies   Patient has no known allergies.   Review of Systems Review of Systems  All other systems reviewed  and are negative.    Physical Exam Updated Vital Signs BP 127/77 (BP Location: Left Arm)   Pulse 80   Temp 98.2 F (36.8 C) (Oral)   Resp 20   Ht 5\' 11"  (1.803 m)   Wt 230 lb (104.3 kg)   SpO2 100%   BMI 32.08 kg/m   Physical Exam  Constitutional: He is oriented to person, place, and time. He appears well-developed and well-nourished.  HENT:  Head: Normocephalic and atraumatic.  Eyes: Conjunctivae are normal.  Neck: Neck supple.  Cardiovascular: Normal rate and regular rhythm.   Pulmonary/Chest: Effort normal and breath sounds normal.  Abdominal:  Minimal left lower quadrant tenderness.  Genitourinary:  Genitourinary Comments: Minimal left flank tenderness  Musculoskeletal: Normal range of motion.  Neurological: He is alert and oriented to person, place, and time.  Skin: Skin is warm and dry.  Psychiatric: He has a normal mood and affect. His behavior is normal.  Nursing note and vitals reviewed.    ED Treatments / Results  Labs (all labs ordered are listed, but only abnormal results are displayed) Labs Reviewed  URINALYSIS, ROUTINE W REFLEX MICROSCOPIC    EKG  EKG Interpretation None       Radiology Dg Abdomen 1 View  Result Date: 03/17/2016 CLINICAL DATA:  Nephrolithiasis. Left-sided flank pain and vomiting. EXAM: ABDOMEN - 1 VIEW  COMPARISON:  CT abdomen pelvis 06/17/2015. FINDINGS: The bowel gas pattern is normal. No radio-opaque calculi or other significant radiographic abnormality are seen. IMPRESSION: No radiographically evident nephrolithiasis. However, in the setting of acute flank pain, noncontrast CT of the abdomen and pelvis is recommended to evaluate for obstructive uropathy. Electronically Signed   By: Deatra Robinson M.D.   On: 03/17/2016 20:27    Procedures Procedures (including critical care time)  Medications Ordered in ED Medications  sodium chloride 0.9 % bolus 1,000 mL (0 mLs Intravenous Stopped 03/17/16 2107)  ondansetron (ZOFRAN)  injection 4 mg (4 mg Intravenous Given 03/17/16 1944)  morphine 4 MG/ML injection 4 mg (4 mg Intravenous Given 03/17/16 1944)  ketorolac (TORADOL) 30 MG/ML injection 30 mg (30 mg Intravenous Given 03/17/16 1943)     Initial Impression / Assessment and Plan / ED Course  I have reviewed the triage vital signs and the nursing notes.  Pertinent labs & imaging results that were available during my care of the patient were reviewed by me and considered in my medical decision making (see chart for details).    Patient is nontoxic-appearing. Urinalysis and KUB were acceptable. Patient has had multiple CT scans. Will treat his pain. He has urology follow-up. Discharge medications percocet and zofran 8 mg   Final Clinical Impressions(s) / ED Diagnoses   Final diagnoses:  Left flank pain    New Prescriptions New Prescriptions   ONDANSETRON (ZOFRAN) 8 MG TABLET    Take 1 tablet (8 mg total) by mouth every 4 (four) hours as needed.   OXYCODONE-ACETAMINOPHEN (PERCOCET/ROXICET) 5-325 MG TABLET    Take 1-2 tablets by mouth every 4 (four) hours as needed for severe pain.     Donnetta Hutching, MD 03/17/16 2258

## 2016-03-17 NOTE — ED Triage Notes (Addendum)
Patient complains of left flank pain that started today about 4 pm. States pain radiates from lower flank to groin. Patient states history of kidney stones.

## 2016-03-17 NOTE — Discharge Instructions (Signed)
X-ray and urine sample were normal. Prescription for pain and nausea medicine. Restart Flomax for the time being. Follow-up with your urologist.

## 2016-05-11 ENCOUNTER — Encounter (HOSPITAL_COMMUNITY): Payer: Self-pay

## 2016-05-11 DIAGNOSIS — R109 Unspecified abdominal pain: Secondary | ICD-10-CM | POA: Diagnosis present

## 2016-05-11 DIAGNOSIS — N200 Calculus of kidney: Secondary | ICD-10-CM | POA: Diagnosis not present

## 2016-05-11 DIAGNOSIS — F17210 Nicotine dependence, cigarettes, uncomplicated: Secondary | ICD-10-CM | POA: Diagnosis not present

## 2016-05-11 LAB — URINALYSIS, ROUTINE W REFLEX MICROSCOPIC
BILIRUBIN URINE: NEGATIVE
GLUCOSE, UA: NEGATIVE mg/dL
Ketones, ur: NEGATIVE mg/dL
NITRITE: NEGATIVE
Protein, ur: 30 mg/dL — AB
Specific Gravity, Urine: 1.016 (ref 1.005–1.030)
pH: 5 (ref 5.0–8.0)

## 2016-05-11 NOTE — ED Triage Notes (Signed)
Patient states that he is having left sided flank pain that started this evening.  Earlier today he had urinary frequency.  History of kidney stones.  Started having nausea and vomiting this evening.

## 2016-05-12 ENCOUNTER — Emergency Department (HOSPITAL_COMMUNITY): Payer: BLUE CROSS/BLUE SHIELD

## 2016-05-12 ENCOUNTER — Emergency Department (HOSPITAL_COMMUNITY)
Admission: EM | Admit: 2016-05-12 | Discharge: 2016-05-12 | Disposition: A | Discharge status: Home / Self Care | Payer: BLUE CROSS/BLUE SHIELD | Attending: Emergency Medicine | Admitting: Emergency Medicine

## 2016-05-12 DIAGNOSIS — N200 Calculus of kidney: Secondary | ICD-10-CM

## 2016-05-12 DIAGNOSIS — R109 Unspecified abdominal pain: Secondary | ICD-10-CM

## 2016-05-12 LAB — BASIC METABOLIC PANEL
ANION GAP: 7 (ref 5–15)
BUN: 15 mg/dL (ref 6–20)
CO2: 25 mmol/L (ref 22–32)
Calcium: 9.4 mg/dL (ref 8.9–10.3)
Chloride: 107 mmol/L (ref 101–111)
Creatinine, Ser: 0.9 mg/dL (ref 0.61–1.24)
GFR calc Af Amer: >60 mL/min (ref >60)
GLUCOSE: 89 mg/dL (ref 65–99)
POTASSIUM: 3.8 mmol/L (ref 3.5–5.1)
Sodium: 139 mmol/L (ref 135–145)

## 2016-05-12 MED ORDER — NAPROXEN 500 MG PO TABS: 500.0000 mg | ORAL_TABLET | Freq: Two times a day (BID) | ORAL | 0 refills | Status: AC

## 2016-05-12 MED ORDER — KETOROLAC TROMETHAMINE 30 MG/ML IJ SOLN
30.0000 mg | Freq: Once | INTRAMUSCULAR | Status: AC
  Administered 2016-05-12: 30 mg via INTRAVENOUS
  Filled 2016-05-12: qty 1

## 2016-05-12 MED ORDER — MORPHINE SULFATE (PF) 4 MG/ML IV SOLN
4.0000 mg | Freq: Once | INTRAVENOUS | Status: AC
  Administered 2016-05-12: 4 mg via INTRAVENOUS
  Filled 2016-05-12: qty 1

## 2016-05-12 MED ORDER — SODIUM CHLORIDE 0.9 % IV BOLUS (SEPSIS)
1000.0000 mL | Freq: Once | INTRAVENOUS | Status: AC
  Administered 2016-05-12: 1000 mL via INTRAVENOUS

## 2016-05-12 MED ORDER — OXYCODONE-ACETAMINOPHEN 5-325 MG PO TABS: 1.0000 | ORAL_TABLET | Freq: Four times a day (QID) | ORAL | 0 refills | Status: DC | PRN

## 2016-05-12 MED ORDER — ONDANSETRON HCL 4 MG/2ML IJ SOLN
4.0000 mg | Freq: Once | INTRAMUSCULAR | Status: AC
  Administered 2016-05-12: 4 mg via INTRAVENOUS
  Filled 2016-05-12: qty 2

## 2016-05-12 MED ORDER — ONDANSETRON 4 MG PO TBDP: 4.0000 mg | ORAL_TABLET | Freq: Three times a day (TID) | ORAL | 0 refills | Status: DC | PRN

## 2016-05-12 NOTE — ED Provider Notes (Signed)
[facility] Provider Note   CSN: [id number] Arrival date & time: 05/11/16  2034     History   Chief Complaint Chief Complaint  Patient presents with  . Flank Pain    HPI Dale Martinez is a 31 y.o. male.  HPI  This is a 31 year old male with a history of kidney stones who presents with left flank pain. Patient states that he started having symptoms yesterday. He noted frequency and urgency. He then started having left-sided flank pain that radiated into his left groin. It is sharp. Current pain is 8 out of 10. He has not taken anything at home for the pain. He reports that this is consistent with our kidney stones. Denies any fevers. Does report nausea without vomiting.  Past Medical History:  Diagnosis Date  . Chronic flank pain   . Kidney stone   . Strep throat     There are no active problems to display for this patient.   Past Surgical History:  Procedure Laterality Date  . LITHOTRIPSY         Home Medications    Prior to Admission medications   Medication Sig Start Date End Date Taking? Authorizing Provider  albuterol (PROVENTIL HFA;VENTOLIN HFA) 108 (90 Base) MCG/ACT inhaler Inhale 1-2 puffs into the lungs every 6 (six) hours as needed for wheezing or shortness of breath. Dispense with spacer 10/10/15   Burgess Amor, PA-C  naproxen (NAPROSYN) 500 MG tablet Take 1 tablet (500 mg total) by mouth 2 (two) times daily. 05/12/16   Shon Baton, MD  ondansetron (ZOFRAN ODT) 4 MG disintegrating tablet Take 1 tablet (4 mg total) by mouth every 8 (eight) hours as needed for nausea or vomiting. 05/12/16   Shon Baton, MD  ondansetron (ZOFRAN) 8 MG tablet Take 1 tablet (8 mg total) by mouth every 4 (four) hours as needed. 03/17/16   Donnetta Hutching, MD  oxyCODONE-acetaminophen (PERCOCET/ROXICET) 5-325 MG tablet Take 1-2 tablets by mouth every 6 (six) hours as needed for severe pain. 05/12/16   Shon Baton, MD  tamsulosin (FLOMAX) 0.4 MG CAPS capsule Take 1 capsule  (0.4 mg total) by mouth daily. Patient not taking: Reported on 03/17/2016 11/25/15   Marily Memos, MD    Family History Family History  Problem Relation Age of Onset  . Diabetes Father     Social History Social History  Substance Use Topics  . Smoking status: Current Every Day Smoker    Packs/day: 1.00    Types: Cigarettes  . Smokeless tobacco: Never Used  . Alcohol use No     Allergies   Patient has no known allergies.   Review of Systems Review of Systems  Constitutional: Negative for fever.  Respiratory: Negative for shortness of breath.   Cardiovascular: Negative for chest pain.  Gastrointestinal: Positive for nausea. Negative for vomiting.  Genitourinary: Positive for flank pain, frequency and urgency. Negative for dysuria.  All other systems reviewed and are negative.    Physical Exam Updated Vital Signs BP 125/83 (BP Location: Left Arm)   Pulse 86   Temp 97.7 F (36.5 C) (Oral)   Resp 20   Ht  (1.803 m)   Wt 250 lb (113.4 kg)   SpO2 96%   BMI 34.87 kg/m   Physical Exam  Constitutional: He is oriented to person, place, and time. He appears well-developed and well-nourished. No distress.  HENT:  Head: Normocephalic and atraumatic.  Cardiovascular: Normal rate, regular rhythm and normal heart sounds.  No murmur heard. Pulmonary/Chest: Effort normal and breath sounds normal. No respiratory distress. He has no wheezes.  Abdominal: Soft. Bowel sounds are normal. There is no tenderness. There is no rebound.  No CVA tenderness  Neurological: He is alert and oriented to person, place, and time.  Skin: Skin is warm and dry.  Psychiatric: He has a normal mood and affect.  Nursing note and vitals reviewed.    ED Treatments / Results  Labs (all labs ordered are listed, but only abnormal results are displayed) Labs Reviewed  URINALYSIS, ROUTINE W REFLEX MICROSCOPIC - Abnormal; Notable for the following:       Result Value   APPearance CLOUDY (*)      Hgb urine dipstick LARGE (*)    Protein, ur 30 (*)    Leukocytes, UA MODERATE (*)    Bacteria, UA RARE (*)    Squamous Epithelial / LPF 6-30 (*)    All other components within normal limits  BASIC METABOLIC PANEL    EKG  EKG Interpretation None       Radiology Dg Abdomen 1 View  Result Date: 05/12/2016 CLINICAL DATA:  Acute left flank pain since 7 p.m. history of renal stones and lithotripsy. EXAM: ABDOMEN - 1 VIEW COMPARISON:  None. FINDINGS: The bowel gas pattern is normal. 3 mm calculus projects over the left renal shadow. The upper pole is obscured by bowel. There appears be a curvilinear calcification also noted projecting over the expected location of the left kidney measuring up to 8 mm in total. These may represent normal renal calculi. Pelvic calcifications are noted bilaterally in keeping with phleboliths. Distal ureteral stones cannot be entirely excluded, one which projects over the region of the left distal ureter measuring approximately 2 mm. No acute osseous abnormality. Bone islands are suggested over the left acetabulum and femoral head. IMPRESSION: Findings suspicious for bilateral nephrolithiasis. Possible 2 mm calculus along the expected location of the left ureter versus vascular phlebolith. Electronically Signed   By: Tollie Eth M.D.   On: 05/12/2016 03:26    Procedures Procedures (including critical care time)  Medications Ordered in ED Medications  sodium chloride 0.9 % bolus 1,000 mL (1,000 mLs Intravenous New Bag/Given 05/12/16 0222)  morphine 4 MG/ML injection 4 mg (4 mg Intravenous Given 05/12/16 0240)  ondansetron (ZOFRAN) injection 4 mg (4 mg Intravenous Given 05/12/16 0223)  ketorolac (TORADOL) 30 MG/ML injection 30 mg (30 mg Intravenous Given 05/12/16 0222)     Initial Impression / Assessment and Plan / ED Course  I have reviewed the triage vital signs and the nursing notes.  Pertinent labs & imaging results that were available during my care of the  patient were reviewed by me and considered in my medical decision making (see chart for details).     Patient presents with left flank pain which he states is consistent with prior kidney stones. He is nontoxic-appearing.  Given his history, urinalysis, BMP, and KUB obtained. KUB is suspicious for 2 mm kidney stone at the left ureter. Lab work is reassuring. He does have 6-30 white cells with rare bacteria. Urine culture sent. He does not appear septic. We'll treat with pain medication and nausea medication. Expectant management and follow-up with his urologist.  After history, exam, and medical workup I feel the patient has been appropriately medically screened and is safe for discharge home. Pertinent diagnoses were discussed with the patient. Patient was given return precautions.   Final Clinical Impressions(s) / ED Diagnoses   Final  diagnoses:  Left flank pain  Kidney stone    New Prescriptions New Prescriptions   NAPROXEN (NAPROSYN) 500 MG TABLET    Take 1 tablet (500 mg total) by mouth 2 (two) times daily.   ONDANSETRON (ZOFRAN ODT) 4 MG DISINTEGRATING TABLET    Take 1 tablet (4 mg total) by mouth every 8 (eight) hours as needed for nausea or vomiting.   OXYCODONE-ACETAMINOPHEN (PERCOCET/ROXICET) 5-325 MG TABLET    Take 1-2 tablets by mouth every 6 (six) hours as needed for severe pain.     Shon Baton, MD 05/12/16 [phone]

## 2016-05-12 NOTE — ED Notes (Signed)
Pt ambulatory to the waiting room. Pt verbalized understanding of discharge instructions. 

## 2016-06-30 ENCOUNTER — Emergency Department (HOSPITAL_COMMUNITY): Payer: BLUE CROSS/BLUE SHIELD

## 2016-06-30 ENCOUNTER — Emergency Department (HOSPITAL_COMMUNITY)
Admission: EM | Admit: 2016-06-30 | Discharge: 2016-06-30 | Disposition: A | Discharge status: Home / Self Care | Payer: BLUE CROSS/BLUE SHIELD | Attending: Emergency Medicine | Admitting: Emergency Medicine

## 2016-06-30 ENCOUNTER — Encounter (HOSPITAL_COMMUNITY): Payer: Self-pay | Admitting: Emergency Medicine

## 2016-06-30 DIAGNOSIS — R319 Hematuria, unspecified: Secondary | ICD-10-CM | POA: Insufficient documentation

## 2016-06-30 DIAGNOSIS — R110 Nausea: Secondary | ICD-10-CM | POA: Insufficient documentation

## 2016-06-30 DIAGNOSIS — Z79899 Other long term (current) drug therapy: Secondary | ICD-10-CM | POA: Insufficient documentation

## 2016-06-30 DIAGNOSIS — R109 Unspecified abdominal pain: Secondary | ICD-10-CM | POA: Insufficient documentation

## 2016-06-30 DIAGNOSIS — F17210 Nicotine dependence, cigarettes, uncomplicated: Secondary | ICD-10-CM | POA: Insufficient documentation

## 2016-06-30 LAB — CBC WITH DIFFERENTIAL/PLATELET
BASOS ABS: 0.1 10*3/uL (ref 0.0–0.1)
BASOS PCT: 0 %
EOS PCT: 3 %
Eosinophils Absolute: 0.4 10*3/uL (ref 0.0–0.7)
HEMATOCRIT: 43.9 % (ref 39.0–52.0)
Hemoglobin: 14.9 g/dL (ref 13.0–17.0)
LYMPHS PCT: 25 %
Lymphs Abs: 3.3 10*3/uL (ref 0.7–4.0)
MCH: 31.2 pg (ref 26.0–34.0)
MCHC: 33.9 g/dL (ref 30.0–36.0)
MCV: 92 fL (ref 78.0–100.0)
MONO ABS: 1.2 10*3/uL — AB (ref 0.1–1.0)
MONOS PCT: 9 %
NEUTROS ABS: 8.2 10*3/uL — AB (ref 1.7–7.7)
Neutrophils Relative %: 63 %
PLATELETS: 238 10*3/uL (ref 150–400)
RBC: 4.77 MIL/uL (ref 4.22–5.81)
RDW: 13.4 % (ref 11.5–15.5)
WBC: 13.1 10*3/uL — ABNORMAL HIGH (ref 4.0–10.5)

## 2016-06-30 LAB — URINALYSIS, ROUTINE W REFLEX MICROSCOPIC
Bilirubin Urine: NEGATIVE
GLUCOSE, UA: NEGATIVE mg/dL
KETONES UR: NEGATIVE mg/dL
Leukocytes, UA: NEGATIVE
NITRITE: NEGATIVE
PH: 5 (ref 5.0–8.0)
PROTEIN: 30 mg/dL — AB
Specific Gravity, Urine: 1.017 (ref 1.005–1.030)

## 2016-06-30 LAB — BASIC METABOLIC PANEL
Anion gap: 10 (ref 5–15)
BUN: 16 mg/dL (ref 6–20)
CALCIUM: 9.6 mg/dL (ref 8.9–10.3)
CO2: 24 mmol/L (ref 22–32)
CREATININE: 0.95 mg/dL (ref 0.61–1.24)
Chloride: 101 mmol/L (ref 101–111)
GFR calc Af Amer: >60 mL/min (ref >60)
GLUCOSE: 99 mg/dL (ref 65–99)
Potassium: 3.4 mmol/L — ABNORMAL LOW (ref 3.5–5.1)
Sodium: 135 mmol/L (ref 135–145)

## 2016-06-30 MED ORDER — CEPHALEXIN 500 MG PO CAPS: 500.0000 mg | ORAL_CAPSULE | Freq: Four times a day (QID) | ORAL | 0 refills | Status: DC

## 2016-06-30 MED ORDER — CEPHALEXIN 500 MG PO CAPS
500.0000 mg | ORAL_CAPSULE | Freq: Once | ORAL | Status: AC
  Administered 2016-06-30: 500 mg via ORAL
  Filled 2016-06-30: qty 1

## 2016-06-30 MED ORDER — KETOROLAC TROMETHAMINE 30 MG/ML IJ SOLN
30.0000 mg | Freq: Once | INTRAMUSCULAR | Status: AC
  Administered 2016-06-30: 30 mg via INTRAVENOUS
  Filled 2016-06-30: qty 1

## 2016-06-30 MED ORDER — ONDANSETRON HCL 4 MG/2ML IJ SOLN
4.0000 mg | Freq: Once | INTRAMUSCULAR | Status: AC
  Administered 2016-06-30: 4 mg via INTRAVENOUS
  Filled 2016-06-30: qty 2

## 2016-06-30 MED ORDER — SODIUM CHLORIDE 0.9 % IV BOLUS (SEPSIS)
1000.0000 mL | Freq: Once | INTRAVENOUS | Status: AC
  Administered 2016-06-30: 1000 mL via INTRAVENOUS

## 2016-06-30 MED ORDER — PROMETHAZINE HCL 25 MG PO TABS: 25.0000 mg | ORAL_TABLET | Freq: Four times a day (QID) | ORAL | 1 refills | Status: DC | PRN

## 2016-06-30 MED ORDER — SODIUM CHLORIDE 0.9 % IV SOLN: INTRAVENOUS | Status: DC

## 2016-06-30 MED ORDER — SODIUM CHLORIDE 0.9 % IV BOLUS (SEPSIS): 250.0000 mL | Freq: Once | INTRAVENOUS | Status: DC

## 2016-06-30 MED ORDER — NAPROXEN 500 MG PO TABS: 500.0000 mg | ORAL_TABLET | Freq: Two times a day (BID) | ORAL | 0 refills | Status: DC

## 2016-06-30 NOTE — ED Provider Notes (Addendum)
[facility] Provider Note   CSN: [id number] Arrival date & time: 06/30/16  2005     History   Chief Complaint Chief Complaint  Patient presents with  . Flank Pain    HPI Dale Martinez is a 31 y.o. male.   Patient with acute onset of right flank pain radiating to the groin area 1:00's afternoon. Associated with nausea no vomiting. Associated with hematuria. Patient has a history of prior kidney stones.      Past Medical History:  Diagnosis Date  . Chronic flank pain   . Kidney stone   . Strep throat     There are no active problems to display for this patient.   Past Surgical History:  Procedure Laterality Date  . LITHOTRIPSY         Home Medications    Prior to Admission medications   Medication Sig Start Date End Date Taking? Authorizing Provider  albuterol (PROVENTIL HFA;VENTOLIN HFA) 108 (90 Base) MCG/ACT inhaler Inhale 1-2 puffs into the lungs every 6 (six) hours as needed for wheezing or shortness of breath. Dispense with spacer 10/10/15   Idol, Raynelle FanningJulie, PA-C  naproxen (NAPROSYN) 500 MG tablet Take 1 tablet (500 mg total) by mouth 2 (two) times daily. 05/12/16   Horton, Mayer Maskerourtney F, MD  ondansetron (ZOFRAN ODT) 4 MG disintegrating tablet Take 1 tablet (4 mg total) by mouth every 8 (eight) hours as needed for nausea or vomiting. 05/12/16   Horton, Mayer Maskerourtney F, MD  ondansetron (ZOFRAN) 8 MG tablet Take 1 tablet (8 mg total) by mouth every 4 (four) hours as needed. 03/17/16   Donnetta Hutchingook, Brian, MD  oxyCODONE-acetaminophen (PERCOCET/ROXICET) 5-325 MG tablet Take 1-2 tablets by mouth every 6 (six) hours as needed for severe pain. 05/12/16   Horton, Mayer Maskerourtney F, MD  tamsulosin (FLOMAX) 0.4 MG CAPS capsule Take 1 capsule (0.4 mg total) by mouth daily. Patient not taking: Reported on 03/17/2016 11/25/15   Mesner, Barbara CowerJason, MD    Family History Family History  Problem Relation Age of Onset  . Diabetes Father     Social History Social History  Substance Use Topics  .  Smoking status: Current Every Day Smoker    Packs/day: 1.00    Types: Cigarettes  . Smokeless tobacco: Never Used  . Alcohol use No     Allergies   Patient has no known allergies.   Review of Systems Review of Systems  Constitutional: Negative for fever.  HENT: Negative for congestion.   Eyes: Negative for redness.  Respiratory: Negative for shortness of breath.   Cardiovascular: Negative for chest pain.  Gastrointestinal: Positive for abdominal pain and nausea. Negative for vomiting.  Genitourinary: Positive for flank pain and hematuria.  Musculoskeletal: Negative for back pain.  Skin: Negative for rash.  Neurological: Negative for headaches.  Hematological: Does not bruise/bleed easily.  Psychiatric/Behavioral: Negative for confusion.     Physical Exam Updated Vital Signs BP (!) 155/89 (BP Location: Right Arm)   Pulse (!) 102   Temp 98 F (36.7 C) (Oral)   Resp 20   Ht 1.803 m (5\' 11" )   Wt 113.4 kg (250 lb)   SpO2 95%   BMI 34.87 kg/m   Physical Exam  Constitutional: He is oriented to person, place, and time. He appears well-developed and well-nourished. No distress.  HENT:  Head: Normocephalic and atraumatic.  Mouth/Throat: Oropharynx is clear and moist.  Eyes: Conjunctivae and EOM are normal. Pupils are equal, round, and reactive to light.  Neck: Normal range  of motion. Neck supple.  Cardiovascular: Normal rate, regular rhythm and intact distal pulses.   Pulmonary/Chest: Effort normal and breath sounds normal. No respiratory distress.  Abdominal: Soft. Bowel sounds are normal. There is no tenderness.  Musculoskeletal: Normal range of motion.  Neurological: He is alert and oriented to person, place, and time. No cranial nerve deficit or sensory deficit. He exhibits normal muscle tone. Coordination normal.  Skin: Skin is warm. No rash noted.  Nursing note and vitals reviewed.    ED Treatments / Results  Labs (all labs ordered are listed, but only  abnormal results are displayed) Labs Reviewed  URINALYSIS, ROUTINE W REFLEX MICROSCOPIC - Abnormal; Notable for the following:       Result Value   APPearance CLOUDY (*)    Hgb urine dipstick LARGE (*)    Protein, ur 30 (*)    Bacteria, UA RARE (*)    Squamous Epithelial / LPF 0-5 (*)    All other components within normal limits  CBC WITH DIFFERENTIAL/PLATELET - Abnormal; Notable for the following:    WBC 13.1 (*)    Neutro Abs 8.2 (*)    Monocytes Absolute 1.2 (*)    All other components within normal limits  BASIC METABOLIC PANEL    EKG  EKG Interpretation None       Radiology No results found.  Procedures Procedures (including critical care time)  Medications Ordered in ED Medications  ondansetron (ZOFRAN) injection 4 mg (not administered)  ketorolac (TORADOL) 30 MG/ML injection 30 mg (not administered)  0.9 %  sodium chloride infusion (not administered)  sodium chloride 0.9 % bolus 1,000 mL (not administered)     Initial Impression / Assessment and Plan / ED Course  I have reviewed the triage vital signs and the nursing notes.  Pertinent labs & imaging results that were available during my care of the patient were reviewed by me and considered in my medical decision making (see chart for details).     Patient with acute onset of right flank pain. Clinically suggestive of renal colic. CT scan pending. Patient does have hematuria. Also has white blood cells in the urine as well.  Patient treated with Toradol and Zofran. Basic electrolytes to include kidney function are still pending. Patient with a mild leukocytosis with white blood cell count 13,000.  CT scan negative for ureteral stone. The urinalysis could be suggestive of urinary tract infection with hematuria and white blood cells. Will treat with Keflex at patient follow-up with urology. Will treat the pain with Naprosyn and the nausea with Phenergan.  Final Clinical Impressions(s) / ED Diagnoses   Final  diagnoses:  Right flank pain    New Prescriptions New Prescriptions   No medications on file     Vanetta Mulders, MD 06/30/16 2141    Vanetta Mulders, MD 06/30/16 1610    Vanetta Mulders, MD 06/30/16 2206

## 2016-06-30 NOTE — ED Triage Notes (Signed)
Pt states he has had multiple kidney stones.  Today started having right flank pain that radiates down to testicles and is urinating blood

## 2016-06-30 NOTE — ED Notes (Signed)
Pt a & o x 4, vs stable, RX/Verbal/written discharge instructions given, verbalized understanding, pt ambulated off unit in stable condition with steady gait

## 2016-06-30 NOTE — Discharge Instructions (Signed)
Today's workup without evidence of any kidney stones in the ureter. But urine may be showing signs of infection. Take antibiotic as directed. Take the Naprosyn for pain. Take the Phenergan for nausea and vomiting. Referral to urology provided make an appointment and follow-up with them. Return for any new or worse symptoms.

## 2016-06-30 NOTE — ED Notes (Signed)
Patient transported to CT 

## 2016-07-02 LAB — URINE CULTURE: CULTURE: NO GROWTH

## 2016-07-15 ENCOUNTER — Encounter (HOSPITAL_COMMUNITY): Payer: Self-pay | Admitting: Emergency Medicine

## 2016-07-15 ENCOUNTER — Emergency Department (HOSPITAL_COMMUNITY)
Admission: EM | Admit: 2016-07-15 | Discharge: 2016-07-16 | Disposition: A | Discharge status: Home / Self Care | Payer: BLUE CROSS/BLUE SHIELD | Attending: Emergency Medicine | Admitting: Emergency Medicine

## 2016-07-15 DIAGNOSIS — F17210 Nicotine dependence, cigarettes, uncomplicated: Secondary | ICD-10-CM | POA: Diagnosis not present

## 2016-07-15 DIAGNOSIS — G479 Sleep disorder, unspecified: Secondary | ICD-10-CM | POA: Diagnosis not present

## 2016-07-15 DIAGNOSIS — F4321 Adjustment disorder with depressed mood: Secondary | ICD-10-CM

## 2016-07-15 DIAGNOSIS — Z79899 Other long term (current) drug therapy: Secondary | ICD-10-CM | POA: Insufficient documentation

## 2016-07-15 DIAGNOSIS — F4320 Adjustment disorder, unspecified: Secondary | ICD-10-CM | POA: Insufficient documentation

## 2016-07-15 MED ORDER — ALPRAZOLAM 0.5 MG PO TABS: 0.5000 mg | ORAL_TABLET | Freq: Every evening | ORAL | 0 refills | Status: DC | PRN

## 2016-07-15 NOTE — Discharge Instructions (Signed)
Use caution taking the medicine prescribed as it will make you sleepy.  Only take about 30 minutes before going to bed.  See the resource guide following.  You may also want to call [facility] - they may offer grief counseling as well.

## 2016-07-15 NOTE — ED Triage Notes (Signed)
Pt states he feel really depressed since father died a week ago. Pt states he needs someone to talk to about is grief, but denies any si/hi ideations at this time.

## 2016-07-17 NOTE — ED Provider Notes (Signed)
[facility] Provider Note   CSN: [id number] Arrival date & time: 07/15/16  2223     History   Chief Complaint Chief Complaint  Patient presents with  . Medical Clearance    HPI Dale Martinez is a 31 y.o. male presenting for assistance with sadness and grief he is currently experiencing with his fathers death which occurred one week ago.  His father had end stage cirrhosis but he continued to rally despite medical setbacks and the patient was optimistic that this was another routine hospitalization, not expecting him to die.  He was at his bedside when he died and has difficulty erasing these images, causing intermittent episodes of crying and difficulty sleeping.  Dale Martinez denies suicidal or homicidal ideation and does not have a psychiatric history.  He reports difficulty expressing emotion to family and his fiance so has tried to hold all this in.  He left work [employer] because he broke down and couldn't continue his job.  He has had no treatment prior to arrival..  The history is provided by the patient.    Past Medical History:  Diagnosis Date  . Chronic flank pain   . Kidney stone   . Strep throat     There are no active problems to display for this patient.   Past Surgical History:  Procedure Laterality Date  . LITHOTRIPSY         Home Medications    Prior to Admission medications   Medication Sig Start Date End Date Taking? Authorizing Provider  albuterol (PROVENTIL HFA;VENTOLIN HFA) 108 (90 Base) MCG/ACT inhaler Inhale 1-2 puffs into the lungs every 6 (six) hours as needed for wheezing or shortness of breath. Dispense with spacer 10/10/15  Yes Melaysia Streed, Raynelle Fanning, PA-C  ALPRAZolam Prudy Feeler) 0.5 MG tablet Take 1 tablet (0.5 mg total) by mouth at bedtime as needed for anxiety or sleep. 07/15/16   Burgess Amor, PA-C  cephALEXin (KEFLEX) 500 MG capsule Take 1 capsule (500 mg total) by mouth 4 (four) times daily. Patient not taking: Reported on 07/15/2016 06/30/16    Vanetta Mulders, MD  naproxen (NAPROSYN) 500 MG tablet Take 1 tablet (500 mg total) by mouth 2 (two) times daily. Patient not taking: Reported on 07/15/2016 05/12/16   Horton, Mayer Masker, MD  naproxen (NAPROSYN) 500 MG tablet Take 1 tablet (500 mg total) by mouth 2 (two) times daily. Patient not taking: Reported on 07/15/2016 06/30/16   Vanetta Mulders, MD  ondansetron (ZOFRAN ODT) 4 MG disintegrating tablet Take 1 tablet (4 mg total) by mouth every 8 (eight) hours as needed for nausea or vomiting. Patient not taking: Reported on 07/15/2016 05/12/16   Horton, Mayer Masker, MD  ondansetron (ZOFRAN) 8 MG tablet Take 1 tablet (8 mg total) by mouth every 4 (four) hours as needed. Patient not taking: Reported on 07/15/2016 03/17/16   Donnetta Hutching, MD  oxyCODONE-acetaminophen (PERCOCET/ROXICET) 5-325 MG tablet Take 1-2 tablets by mouth every 6 (six) hours as needed for severe pain. Patient not taking: Reported on 07/15/2016 05/12/16   Horton, Mayer Masker, MD  promethazine (PHENERGAN) 25 MG tablet Take 1 tablet (25 mg total) by mouth every 6 (six) hours as needed. Patient not taking: Reported on 07/15/2016 06/30/16   Vanetta Mulders, MD  tamsulosin (FLOMAX) 0.4 MG CAPS capsule Take 1 capsule (0.4 mg total) by mouth daily. Patient not taking: Reported on 03/17/2016 11/25/15   Mesner, Barbara Cower, MD    Family History Family History  Problem Relation Age of Onset  . Diabetes  Father     Social History Social History  Substance Use Topics  . Smoking status: Current Every Day Smoker    Packs/day: 1.00    Types: Cigarettes  . Smokeless tobacco: Never Used  . Alcohol use No     Allergies   Patient has no known allergies.   Review of Systems Review of Systems  Constitutional: Positive for appetite change. Negative for fever.  HENT: Negative.   Respiratory: Negative for chest tightness and shortness of breath.   Cardiovascular: Negative for chest pain.  Gastrointestinal: Negative for abdominal pain, nausea and  vomiting.  Genitourinary: Negative.   Musculoskeletal: Negative for arthralgias, joint swelling and neck pain.  Skin: Negative.  Negative for rash and wound.  Neurological: Negative for dizziness, weakness, light-headedness, numbness and headaches.  Psychiatric/Behavioral: Positive for sleep disturbance. Negative for suicidal ideas.     Physical Exam Updated Vital Signs BP (!) 142/83 (BP Location: Right Arm)   Pulse 97   Temp 98.2 F (36.8 C) (Oral)   Resp 17   Ht 5\' 11"  (1.803 m)   Wt 113.4 kg (250 lb)   SpO2 99%   BMI 34.87 kg/m   Physical Exam  Constitutional: He is oriented to person, place, and time. He appears well-developed and well-nourished.  HENT:  Head: Normocephalic and atraumatic.  Eyes: Conjunctivae are normal.  Neck: Normal range of motion.  Cardiovascular: Normal rate.   Pulmonary/Chest: Effort normal.  Musculoskeletal: Normal range of motion.  Neurological: He is alert and oriented to person, place, and time.  Skin: Skin is warm and dry.  Psychiatric: He has a normal mood and affect. His speech is normal and behavior is normal. Cognition and memory are normal. He expresses no suicidal plans and no homicidal plans.  Intermittent brief tears during conversation.  Nursing note and vitals reviewed.    ED Treatments / Results  Labs (all labs ordered are listed, but only abnormal results are displayed) Labs Reviewed - No data to display  EKG  EKG Interpretation None       Radiology No results found.  Procedures Procedures (including critical care time)  Medications Ordered in ED Medications - No data to display   Initial Impression / Assessment and Plan / ED Course  I have reviewed the triage vital signs and the nursing notes.  Pertinent labs & imaging results that were available during my care of the patient were reviewed by me and considered in my medical decision making (see chart for details).     Pt with insomnia and normal grief  reaction, no HI/SI.  Pt advised he would benefit from grief counseling.  Tried to instill that his current reaction and emotional lability is normal, healthy and can help him recover from his loss.  He was given a small quantity of xanax for qhs use for insomnia. Referrals given for grief counseling. Also [facility] who may offer these services (provided the funeral services).  Final Clinical Impressions(s) / ED Diagnoses   Final diagnoses:  Grief reaction    New Prescriptions Discharge Medication List as of 07/15/2016 11:43 PM    START taking these medications   Details  ALPRAZolam (XANAX) 0.5 MG tablet Take 1 tablet (0.5 mg total) by mouth at bedtime as needed for anxiety or sleep., Starting Wed 07/15/2016, Print         Burgess Amordol, Enis Leatherwood, PA-C 07/17/16 1243    Dione BoozeGlick, David, MD 07/17/16 2252

## 2016-09-02 ENCOUNTER — Emergency Department (HOSPITAL_COMMUNITY): Payer: BLUE CROSS/BLUE SHIELD

## 2016-09-02 ENCOUNTER — Emergency Department (HOSPITAL_COMMUNITY)
Admission: EM | Admit: 2016-09-02 | Discharge: 2016-09-02 | Disposition: A | Discharge status: Home / Self Care | Payer: BLUE CROSS/BLUE SHIELD | Attending: Emergency Medicine | Admitting: Emergency Medicine

## 2016-09-02 ENCOUNTER — Encounter (HOSPITAL_COMMUNITY): Payer: Self-pay | Admitting: *Deleted

## 2016-09-02 DIAGNOSIS — Z79899 Other long term (current) drug therapy: Secondary | ICD-10-CM | POA: Insufficient documentation

## 2016-09-02 DIAGNOSIS — N12 Tubulo-interstitial nephritis, not specified as acute or chronic: Secondary | ICD-10-CM

## 2016-09-02 DIAGNOSIS — N390 Urinary tract infection, site not specified: Secondary | ICD-10-CM | POA: Diagnosis not present

## 2016-09-02 DIAGNOSIS — R109 Unspecified abdominal pain: Secondary | ICD-10-CM

## 2016-09-02 DIAGNOSIS — R1084 Generalized abdominal pain: Secondary | ICD-10-CM | POA: Insufficient documentation

## 2016-09-02 DIAGNOSIS — F17210 Nicotine dependence, cigarettes, uncomplicated: Secondary | ICD-10-CM | POA: Insufficient documentation

## 2016-09-02 DIAGNOSIS — N10 Acute tubulo-interstitial nephritis: Secondary | ICD-10-CM | POA: Insufficient documentation

## 2016-09-02 DIAGNOSIS — R8281 Pyuria: Secondary | ICD-10-CM

## 2016-09-02 LAB — URINALYSIS, ROUTINE W REFLEX MICROSCOPIC
BILIRUBIN URINE: NEGATIVE
Glucose, UA: NEGATIVE mg/dL
KETONES UR: NEGATIVE mg/dL
Nitrite: NEGATIVE
PROTEIN: 30 mg/dL — AB
Specific Gravity, Urine: 1.024 (ref 1.005–1.030)
pH: 5 (ref 5.0–8.0)

## 2016-09-02 LAB — CBC WITH DIFFERENTIAL/PLATELET
BASOS ABS: 0.1 10*3/uL (ref 0.0–0.1)
Basophils Relative: 1 %
EOS ABS: 0.4 10*3/uL (ref 0.0–0.7)
EOS PCT: 3 %
HCT: 45.1 % (ref 39.0–52.0)
Hemoglobin: 15.4 g/dL (ref 13.0–17.0)
LYMPHS PCT: 24 %
Lymphs Abs: 3.8 10*3/uL (ref 0.7–4.0)
MCH: 31.4 pg (ref 26.0–34.0)
MCHC: 34.1 g/dL (ref 30.0–36.0)
MCV: 91.9 fL (ref 78.0–100.0)
MONO ABS: 1.1 10*3/uL — AB (ref 0.1–1.0)
Monocytes Relative: 7 %
Neutro Abs: 10.2 10*3/uL — ABNORMAL HIGH (ref 1.7–7.7)
Neutrophils Relative %: 65 %
PLATELETS: 223 10*3/uL (ref 150–400)
RBC: 4.91 MIL/uL (ref 4.22–5.81)
RDW: 13.4 % (ref 11.5–15.5)
WBC: 15.5 10*3/uL — AB (ref 4.0–10.5)

## 2016-09-02 LAB — BASIC METABOLIC PANEL
ANION GAP: 8 (ref 5–15)
BUN: 11 mg/dL (ref 6–20)
CALCIUM: 9.5 mg/dL (ref 8.9–10.3)
CO2: 24 mmol/L (ref 22–32)
Chloride: 106 mmol/L (ref 101–111)
Creatinine, Ser: 0.86 mg/dL (ref 0.61–1.24)
GFR calc Af Amer: >60 mL/min (ref >60)
GLUCOSE: 151 mg/dL — AB (ref 65–99)
Potassium: 3.6 mmol/L (ref 3.5–5.1)
SODIUM: 138 mmol/L (ref 135–145)

## 2016-09-02 MED ORDER — MORPHINE SULFATE (PF) 4 MG/ML IV SOLN
4.0000 mg | INTRAVENOUS | Status: DC | PRN
  Administered 2016-09-02: 4 mg via INTRAVENOUS
  Filled 2016-09-02: qty 1

## 2016-09-02 MED ORDER — ONDANSETRON HCL 4 MG/2ML IJ SOLN
4.0000 mg | Freq: Once | INTRAMUSCULAR | Status: AC
  Administered 2016-09-02: 4 mg via INTRAVENOUS
  Filled 2016-09-02: qty 2

## 2016-09-02 MED ORDER — DEXTROSE 5 % IV SOLN
1.0000 g | Freq: Once | INTRAVENOUS | Status: AC
  Administered 2016-09-02: 1 g via INTRAVENOUS
  Filled 2016-09-02: qty 10

## 2016-09-02 MED ORDER — ONDANSETRON HCL 4 MG PO TABS: 4.0000 mg | ORAL_TABLET | Freq: Four times a day (QID) | ORAL | 0 refills | Status: AC

## 2016-09-02 MED ORDER — HYDROCODONE-ACETAMINOPHEN 5-325 MG PO TABS: 2.0000 | ORAL_TABLET | ORAL | 0 refills | Status: AC | PRN

## 2016-09-02 MED ORDER — CIPROFLOXACIN HCL 500 MG PO TABS: 500.0000 mg | ORAL_TABLET | Freq: Two times a day (BID) | ORAL | 0 refills | Status: AC

## 2016-09-02 MED ORDER — KETOROLAC TROMETHAMINE 30 MG/ML IJ SOLN
30.0000 mg | Freq: Once | INTRAMUSCULAR | Status: AC
  Administered 2016-09-02: 30 mg via INTRAVENOUS
  Filled 2016-09-02: qty 1

## 2016-09-02 NOTE — ED Notes (Signed)
Pt ambulatory to waiting room. Pt verbalized understanding of discharge instructions.   

## 2016-09-02 NOTE — ED Notes (Signed)
Patient transported to CT 

## 2016-09-02 NOTE — Discharge Instructions (Signed)
Her prescription for antibiotics and pain medication. Follow-up with [facility] to ensure that the infection has resolved.

## 2016-09-02 NOTE — ED Provider Notes (Signed)
[facility] DEPT Provider Note   CSN: [id number] Arrival date & time: 09/02/16  2057     History   Chief Complaint Chief Complaint  Patient presents with  . Flank Pain    HPI Dale Martinez is a 31 y.o. male.Chief complaint is left flank pain and nausea  HPI this is a 31 year old male with long history of multiple kidney stones. Started having pain this morning. Somewhat tolerable to him. He felt like he may have passed a small stone that his pain persists. Nausea and vomiting. No fevers. He says some burning with urination. No gross hematuria.  Had multiple previous ER visits. His most recent CT showed any stones without ureteral stones but did show pyuria. He is treated for infection but no culture obtained.  Past Medical History:  Diagnosis Date  . Chronic flank pain   . Kidney stone   . Strep throat     There are no active problems to display for this patient.   Past Surgical History:  Procedure Laterality Date  . LITHOTRIPSY         Home Medications    Prior to Admission medications   Medication Sig Start Date End Date Taking? Authorizing Provider  ibuprofen (ADVIL,MOTRIN) 200 MG tablet Take 400 mg by mouth every 6 (six) hours as needed for headache or moderate pain.   Yes [provider]  naproxen (NAPROSYN) 500 MG tablet Take 1 tablet (500 mg total) by mouth 2 (two) times daily. 05/12/16  Yes Horton, Mayer Masker, MD  ciprofloxacin (CIPRO) 500 MG tablet Take 1 tablet (500 mg total) by mouth every 12 (twelve) hours. 09/02/16   Rolland Porter, MD  HYDROcodone-acetaminophen (NORCO/VICODIN) 5-325 MG tablet Take 2 tablets by mouth every 4 (four) hours as needed. 09/02/16   Rolland Porter, MD  ondansetron (ZOFRAN) 4 MG tablet Take 1 tablet (4 mg total) by mouth every 6 (six) hours. 09/02/16   Rolland Porter, MD    Family History Family History  Problem Relation Age of Onset  . Diabetes Father     Social History Social History  Substance Use Topics  . Smoking  status: Current Every Day Smoker    Packs/day: 1.00    Types: Cigarettes  . Smokeless tobacco: Never Used  . Alcohol use No     Allergies   Patient has no known allergies.   Review of Systems Review of Systems  Constitutional: Negative for appetite change, chills, diaphoresis, fatigue and fever.  HENT: Negative for mouth sores, sore throat and trouble swallowing.   Eyes: Negative for visual disturbance.  Respiratory: Negative for cough, chest tightness, shortness of breath and wheezing.   Cardiovascular: Negative for chest pain.  Gastrointestinal: Negative for abdominal distention, abdominal pain, diarrhea, nausea and vomiting.  Endocrine: Negative for polydipsia, polyphagia and polyuria.  Genitourinary: Positive for flank pain. Negative for dysuria, frequency and hematuria.  Musculoskeletal: Negative for gait problem.  Skin: Negative for color change, pallor and rash.  Neurological: Negative for dizziness, syncope, light-headedness and headaches.  Hematological: Does not bruise/bleed easily.  Psychiatric/Behavioral: Negative for behavioral problems and confusion.     Physical Exam Updated Vital Signs BP (!) 148/82 (BP Location: Right Arm)   Pulse (!) 107   Temp 98.2 F (36.8 C)   Resp 20   Ht 5\' 11"  (1.803 m)   Wt 111.1 kg (245 lb)   SpO2 95%   BMI 34.17 kg/m   Physical Exam  Constitutional: He is oriented to person, place, and time. He  appears well-developed and well-nourished. No distress.  Lisinopril. Appears uncomfortable but not writhing with pain.  HENT:  Head: Normocephalic.  Eyes: Pupils are equal, round, and reactive to light. Conjunctivae are normal. No scleral icterus.  Neck: Normal range of motion. Neck supple. No thyromegaly present.  Cardiovascular: Normal rate and regular rhythm.  Exam reveals no gallop and no friction rub.   No murmur heard. Pulmonary/Chest: Effort normal and breath sounds normal. No respiratory distress. He has no wheezes. He has  no rales.  Abdominal: Soft. Bowel sounds are normal. He exhibits no distension. There is no tenderness. There is no rebound.  Genitourinary:  Genitourinary Comments: Indicates his left flank and mid abdomen. No tenderness to examine. Normal scrotal examination. He denies discharge or change in partners or high-risk STI symptoms. He said burning but denies hesitancy are weak stream.  Musculoskeletal: Normal range of motion.  Neurological: He is alert and oriented to person, place, and time.  Skin: Skin is warm and dry. No rash noted.  Psychiatric: He has a normal mood and affect. His behavior is normal.     ED Treatments / Results  Labs (all labs ordered are listed, but only abnormal results are displayed) Labs Reviewed  URINALYSIS, ROUTINE W REFLEX MICROSCOPIC - Abnormal; Notable for the following:       Result Value   APPearance CLOUDY (*)    Hgb urine dipstick LARGE (*)    Protein, ur 30 (*)    Leukocytes, UA MODERATE (*)    Bacteria, UA RARE (*)    Squamous Epithelial / LPF 6-30 (*)    All other components within normal limits  CBC WITH DIFFERENTIAL/PLATELET - Abnormal; Notable for the following:    WBC 15.5 (*)    Neutro Abs 10.2 (*)    Monocytes Absolute 1.1 (*)    All other components within normal limits  BASIC METABOLIC PANEL - Abnormal; Notable for the following:    Glucose, Bld 151 (*)    All other components within normal limits  URINE CULTURE    EKG  EKG Interpretation None       Radiology Ct Renal Stone Study  Result Date: 09/02/2016 CLINICAL DATA:  Left-sided flank pain and nausea for 2 days, initial encounter EXAM: CT ABDOMEN AND PELVIS WITHOUT CONTRAST TECHNIQUE: Multidetector CT imaging of the abdomen and pelvis was performed following the standard protocol without IV contrast. COMPARISON:  06/30/2016 FINDINGS: Lower chest: No acute abnormality. Hepatobiliary: No focal liver abnormality is seen. No gallstones, gallbladder wall thickening, or biliary  dilatation. Pancreas: Unremarkable. No pancreatic ductal dilatation or surrounding inflammatory changes. Spleen: Normal in size without focal abnormality. Adrenals/Urinary Tract: The adrenal glands are within normal limits bilaterally. Nonobstructing renal stones are again seen within both kidneys. Migration of one of the right renal stones is noted into the proximal ureter without significant obstructive change. This stone measures approximately 3 mm in dimension. The bladder is decompressed. The left ureter is within normal limits. Stomach/Bowel: Stomach is within normal limits. Appendix appears normal. 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 No evidence of bowel wall thickening, distention, or inflammatory changes. Vascular/Lymphatic: No significant vascular findings are present. No enlarged abdominal or pelvic lymph nodes. Reproductive: Prostate is unremarkable. Other: No abdominal wall hernia or abnormality. No abdominopelvic ascites. Musculoskeletal: No acute or significant osseous findings. IMPRESSION: Bilateral nonobstructing renal calculi. There has been migration of 1 of the stones from the right kidney into the proximal right ureter. It measures approximately 3 mm. No  other focal acute abnormality is noted. Electronically Signed   By: Alcide CleverMark  Lukens M.D.   On: 09/02/2016 22:46    Procedures Procedures (including critical care time)  Medications Ordered in ED Medications  morphine 4 MG/ML injection 4 mg (4 mg Intravenous Given 09/02/16 2310)  cefTRIAXone (ROCEPHIN) 1 g in dextrose 5 % 50 mL IVPB (1 g Intravenous New Bag/Given 09/02/16 2310)  ondansetron (ZOFRAN) injection 4 mg (4 mg Intravenous Given 09/02/16 2147)  ketorolac (TORADOL) 30 MG/ML injection 30 mg (30 mg Intravenous Given 09/02/16 2147)     Initial Impression / Assessment and Plan / ED Course  I have reviewed the triage vital signs and the nursing notes.  Pertinent labs & imaging results that were  available during my care of the patient were reviewed by me and considered in my medical decision making (see chart for details).    I discussed with him wanting to avoid CT scan because his multiple previous ones. However he has hematuria and pyuria on CT. I would be hesitant to Ovral is stone with infection. He was agreeable to imaging. This shows renal stones with no ureteral stone. Culture is pending. Given IV Rocephin. Given Toradol, Zofran, morphine and his symptoms have improved. He is discharged home to continue on Cipro. Vascular follow-up with urology before completion of antibiotics.  Final Clinical Impressions(s) / ED Diagnoses   Final diagnoses:  Flank pain  Pyuria  Pyelonephritis    New Prescriptions New Prescriptions   CIPROFLOXACIN (CIPRO) 500 MG TABLET    Take 1 tablet (500 mg total) by mouth every 12 (twelve) hours.   HYDROCODONE-ACETAMINOPHEN (NORCO/VICODIN) 5-325 MG TABLET    Take 2 tablets by mouth every 4 (four) hours as needed.   ONDANSETRON (ZOFRAN) 4 MG TABLET    Take 1 tablet (4 mg total) by mouth every 6 (six) hours.     Rolland PorterJames, Kailany Dinunzio, MD 09/02/16 [phone]

## 2016-09-02 NOTE — ED Triage Notes (Signed)
PT C/O left side flank pain with nausea that started yesterday,

## 2016-09-04 LAB — URINE CULTURE

## 2016-09-05 ENCOUNTER — Telehealth: Payer: Self-pay

## 2016-09-05 NOTE — Telephone Encounter (Signed)
No further treatment for [facility] ED 09/02/16 Per Pola CornBen Mancheril Pharm D

## 2016-10-27 ENCOUNTER — Encounter (HOSPITAL_COMMUNITY): Payer: Self-pay | Admitting: *Deleted

## 2016-10-27 DIAGNOSIS — F17210 Nicotine dependence, cigarettes, uncomplicated: Secondary | ICD-10-CM | POA: Diagnosis not present

## 2016-10-27 DIAGNOSIS — R1084 Generalized abdominal pain: Secondary | ICD-10-CM | POA: Diagnosis not present

## 2016-10-27 DIAGNOSIS — R319 Hematuria, unspecified: Secondary | ICD-10-CM | POA: Insufficient documentation

## 2016-10-27 DIAGNOSIS — Z79899 Other long term (current) drug therapy: Secondary | ICD-10-CM | POA: Insufficient documentation

## 2016-10-27 NOTE — ED Triage Notes (Signed)
Pt reports left flank pain. Hx of stones.

## 2016-10-28 ENCOUNTER — Emergency Department (HOSPITAL_COMMUNITY)
Admission: EM | Admit: 2016-10-28 | Discharge: 2016-10-28 | Disposition: A | Discharge status: Home / Self Care | Payer: BLUE CROSS/BLUE SHIELD | Attending: Emergency Medicine | Admitting: Emergency Medicine

## 2016-10-28 DIAGNOSIS — R319 Hematuria, unspecified: Secondary | ICD-10-CM

## 2016-10-28 DIAGNOSIS — R109 Unspecified abdominal pain: Secondary | ICD-10-CM

## 2016-10-28 LAB — URINALYSIS, ROUTINE W REFLEX MICROSCOPIC
Bilirubin Urine: NEGATIVE
Glucose, UA: NEGATIVE mg/dL
KETONES UR: NEGATIVE mg/dL
Nitrite: NEGATIVE
PH: 5 (ref 5.0–8.0)
Protein, ur: 30 mg/dL — AB
SPECIFIC GRAVITY, URINE: 1.024 (ref 1.005–1.030)

## 2016-10-28 MED ORDER — HYDROCODONE-ACETAMINOPHEN 5-325 MG PO TABS
1.0000 | ORAL_TABLET | Freq: Once | ORAL | Status: AC
  Administered 2016-10-28: 1 via ORAL
  Filled 2016-10-28: qty 1

## 2016-10-28 MED ORDER — KETOROLAC TROMETHAMINE 30 MG/ML IJ SOLN
30.0000 mg | Freq: Once | INTRAMUSCULAR | Status: AC
  Administered 2016-10-28: 30 mg via INTRAMUSCULAR
  Filled 2016-10-28: qty 1

## 2016-10-28 NOTE — ED Provider Notes (Signed)
[facility] Provider Note   CSN: [id number] Arrival date & time: 10/27/16  2251     History   Chief Complaint Chief Complaint  Patient presents with  . Flank Pain    HPI Dale Martinez is a 31 y.o. male.  The history is provided by the patient.  Flank Pain  This is a new problem. The current episode started yesterday. The problem occurs constantly. The problem has not changed since onset.Associated symptoms include abdominal pain. Pertinent negatives include no chest pain and no shortness of breath. Nothing aggravates the symptoms. Nothing relieves the symptoms.   Pt with long h/o kidney stones Previous h/o lithotripsy He reports left flank pain that radiates to lower abdomen No fever/vomiting Similar to prior episodes  Past Medical History:  Diagnosis Date  . Chronic flank pain   . Kidney stone   . Strep throat     There are no active problems to display for this patient.   Past Surgical History:  Procedure Laterality Date  . LITHOTRIPSY         Home Medications    Prior to Admission medications   Medication Sig Start Date End Date Taking? Authorizing Provider  ibuprofen (ADVIL,MOTRIN) 200 MG tablet Take 400 mg by mouth every 6 (six) hours as needed for headache or moderate pain.   Yes [provider]  ciprofloxacin (CIPRO) 500 MG tablet Take 1 tablet (500 mg total) by mouth every 12 (twelve) hours. 09/02/16   Rolland Porter, MD  HYDROcodone-acetaminophen (NORCO/VICODIN) 5-325 MG tablet Take 2 tablets by mouth every 4 (four) hours as needed. 09/02/16   Rolland Porter, MD  naproxen (NAPROSYN) 500 MG tablet Take 1 tablet (500 mg total) by mouth 2 (two) times daily. 05/12/16   Horton, Mayer Masker, MD  ondansetron (ZOFRAN) 4 MG tablet Take 1 tablet (4 mg total) by mouth every 6 (six) hours. 09/02/16   Rolland Porter, MD    Family History Family History  Problem Relation Age of Onset  . Diabetes Father     Social History Social History  Substance Use  Topics  . Smoking status: Current Every Day Smoker    Packs/day: 1.00    Types: Cigarettes  . Smokeless tobacco: Never Used  . Alcohol use No     Allergies   Patient has no known allergies.   Review of Systems Review of Systems  Constitutional: Negative for fever.  Respiratory: Negative for shortness of breath.   Cardiovascular: Negative for chest pain.  Gastrointestinal: Positive for abdominal pain.  Genitourinary: Positive for flank pain.  All other systems reviewed and are negative.    Physical Exam Updated Vital Signs BP 139/77 (BP Location: Left Arm)   Pulse (!) 119   Temp 98.7 F (37.1 C) (Oral)   Resp 18   Ht 1.803 m ( )   Wt 113.4 kg (250 lb)   SpO2 93%   BMI 34.87 kg/m   Physical Exam CONSTITUTIONAL: Well developed/well nourished HEAD: Normocephalic/atraumatic EYES: EOMI/PERRL ENMT: Mucous membranes moist NECK: supple no meningeal signs SPINE/BACK:entire spine nontender CV: S1/S2 noted, no murmurs/rubs/gallops noted LUNGS: Lungs are clear to auscultation bilaterally, no apparent distress ABDOMEN: soft, nontender, no rebound or guarding, bowel sounds noted throughout abdomen [id number] cva tenderness NEURO: Pt is awake/alert/appropriate, moves all extremitiesx4.  No facial droop.   EXTREMITIES: pulses normal/equal, full ROM SKIN: warm, color normal PSYCH: no abnormalities of mood noted, alert and oriented to situation   ED Treatments / Results  Labs (all labs ordered  are listed, but only abnormal results are displayed) Labs Reviewed  URINALYSIS, ROUTINE W REFLEX MICROSCOPIC - Abnormal; Notable for the following:       Result Value   APPearance CLOUDY (*)    Hgb urine dipstick LARGE (*)    Protein, ur 30 (*)    Leukocytes, UA MODERATE (*)    Bacteria, UA FEW (*)    Squamous Epithelial / LPF 6-30 (*)    All other components within normal limits    EKG  EKG Interpretation None       Radiology No results  found.  Procedures Procedures (including critical care time)  Medications Ordered in ED Medications  ketorolac (TORADOL) 30 MG/ML injection 30 mg (30 mg Intramuscular Given 10/28/16 0216)  HYDROcodone-acetaminophen (NORCO/VICODIN) 5-325 MG per tablet 1 tablet (1 tablet Oral Given 10/28/16 0217)     Initial Impression / Assessment and Plan / ED Course  I have reviewed the triage vital signs and the nursing notes.  Pertinent labs  results that were available during my care of the patient were reviewed by me and considered in my medical decision making (see chart for details). Narcotic database reviewed and considered in decision making     Pt well appearing He is not septic appearing He has had multiple CT scans previously Defer imaging On recheck he is using phone, feels improved Will d/c home Advised to see urology ASAP   Final Clinical Impressions(s) / ED Diagnoses   Final diagnoses:  Flank pain  Hematuria, unspecified type    New Prescriptions Discharge Medication List as of 10/28/2016  3:23 AM       Zadie Rhine, MD 10/28/16 [phone]

## 2016-11-05 IMAGING — DX DG ABDOMEN 1V
1 series · 1 of 1 positions shown · non-contrast
Comparison: CT 02/15/2014

CLINICAL DATA: Left-sided abdominal pain. Left inguinal pain, rule
out stone.

EXAM:
ABDOMEN - 1 VIEW

[abdomen supine]
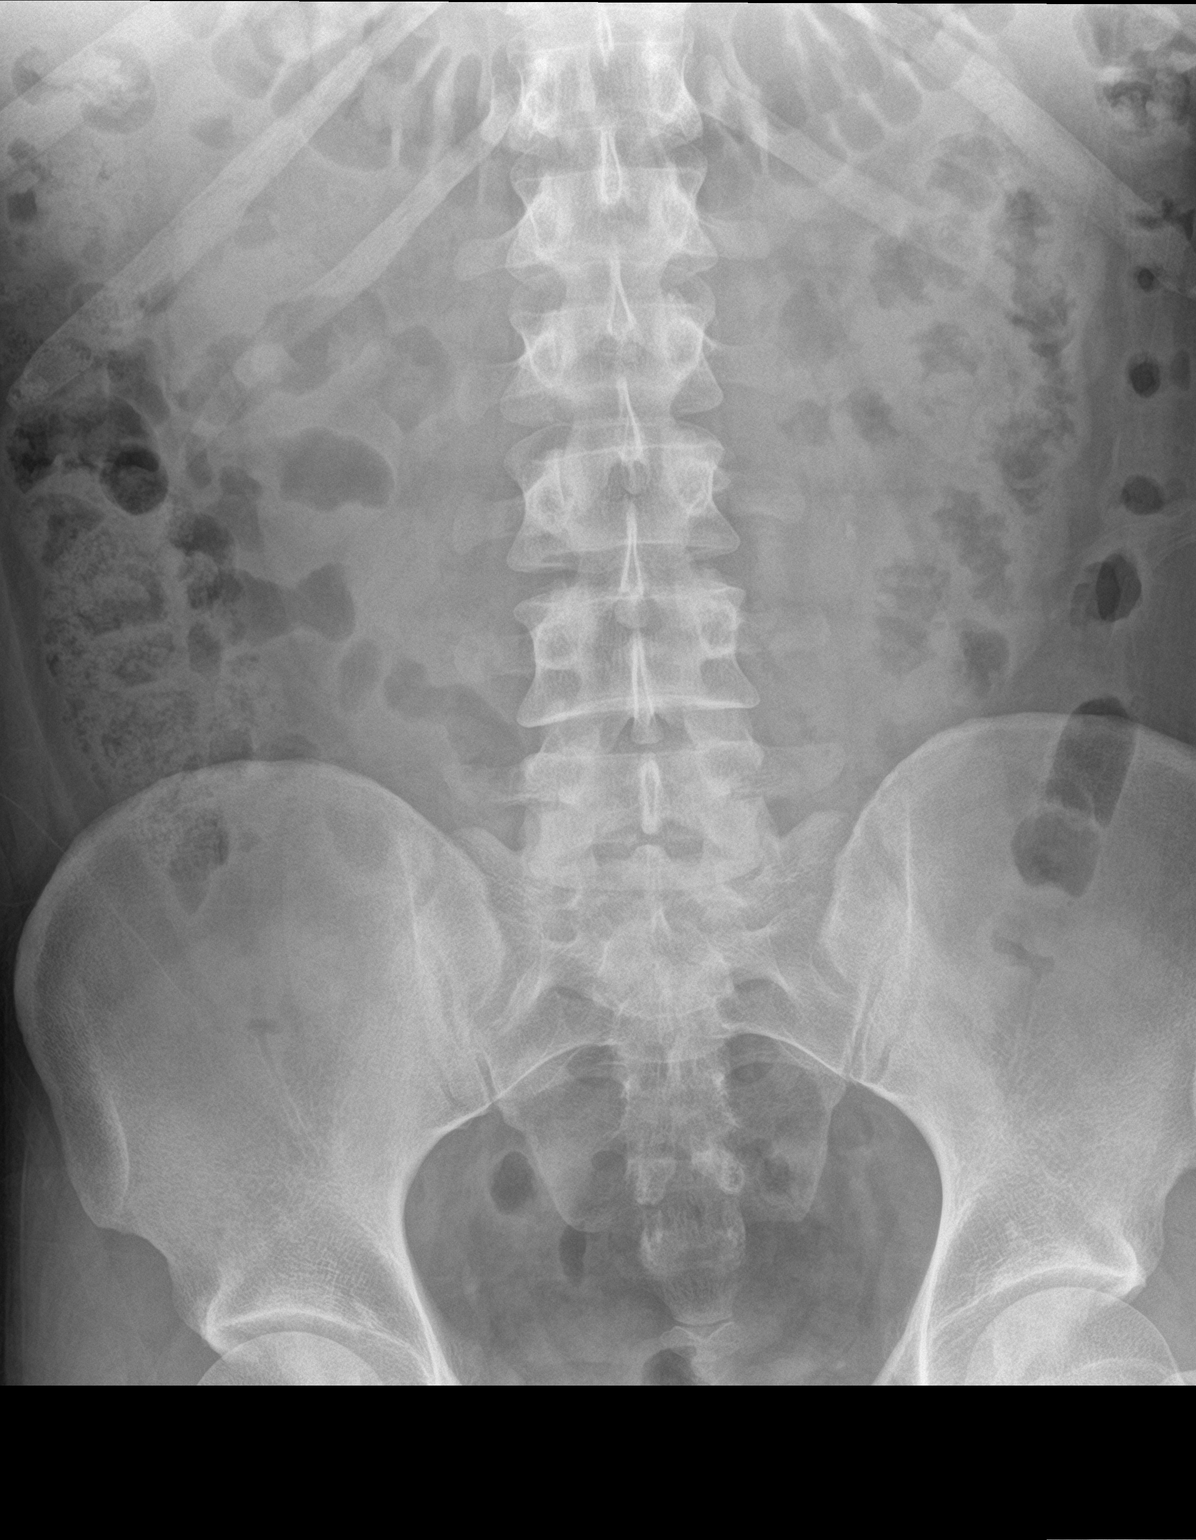

[1 of 1 positions shown; findings below may reference images not displayed]

FINDINGS: There is a 4 mm calcification in the left mid abdomen at the level
of L3. The known additional nonobstructing nephrolithiasis are not
seen radiographically. There is a pelvic phlebolith on the left that
is unchanged from prior CT. Normal bowel gas pattern with moderate
stool in the right colon. No osseous abnormality.
IMPRESSION: A 4 mm calcification in the left mid abdomen at the level [DATE]
reflect a stone in the left proximal ureter.

## 2017-01-20 ENCOUNTER — Emergency Department (HOSPITAL_COMMUNITY)
Admission: EM | Admit: 2017-01-20 | Discharge: 2017-01-21 | Disposition: A | Discharge status: Home / Self Care | Payer: BLUE CROSS/BLUE SHIELD | Attending: Emergency Medicine | Admitting: Emergency Medicine

## 2017-01-20 ENCOUNTER — Encounter (HOSPITAL_COMMUNITY): Payer: Self-pay | Admitting: Emergency Medicine

## 2017-01-20 DIAGNOSIS — Z79899 Other long term (current) drug therapy: Secondary | ICD-10-CM | POA: Diagnosis not present

## 2017-01-20 DIAGNOSIS — N3001 Acute cystitis with hematuria: Secondary | ICD-10-CM | POA: Diagnosis not present

## 2017-01-20 DIAGNOSIS — F17210 Nicotine dependence, cigarettes, uncomplicated: Secondary | ICD-10-CM | POA: Insufficient documentation

## 2017-01-20 DIAGNOSIS — M545 Low back pain: Secondary | ICD-10-CM | POA: Diagnosis present

## 2017-01-20 DIAGNOSIS — N200 Calculus of kidney: Secondary | ICD-10-CM | POA: Insufficient documentation

## 2017-01-20 NOTE — ED Triage Notes (Signed)
Pt reports lower left back pain x 1 day that now wraps around from back to groin

## 2017-01-21 ENCOUNTER — Emergency Department (HOSPITAL_COMMUNITY): Payer: BLUE CROSS/BLUE SHIELD

## 2017-01-21 LAB — CBC WITH DIFFERENTIAL/PLATELET
BASOS ABS: 0.1 10*3/uL (ref 0.0–0.1)
Basophils Relative: 1 %
EOS PCT: 2 %
Eosinophils Absolute: 0.3 10*3/uL (ref 0.0–0.7)
HCT: 45.4 % (ref 39.0–52.0)
Hemoglobin: 15 g/dL (ref 13.0–17.0)
LYMPHS PCT: 26 %
Lymphs Abs: 4 10*3/uL (ref 0.7–4.0)
MCH: 31.4 pg (ref 26.0–34.0)
MCHC: 33 g/dL (ref 30.0–36.0)
MCV: 95.2 fL (ref 78.0–100.0)
MONO ABS: 1.3 10*3/uL — AB (ref 0.1–1.0)
MONOS PCT: 8 %
Neutro Abs: 9.8 10*3/uL — ABNORMAL HIGH (ref 1.7–7.7)
Neutrophils Relative %: 63 %
PLATELETS: 231 10*3/uL (ref 150–400)
RBC: 4.77 MIL/uL (ref 4.22–5.81)
RDW: 13.4 % (ref 11.5–15.5)
WBC: 15.5 10*3/uL — ABNORMAL HIGH (ref 4.0–10.5)

## 2017-01-21 LAB — URINALYSIS, ROUTINE W REFLEX MICROSCOPIC
Bilirubin Urine: NEGATIVE
Glucose, UA: NEGATIVE mg/dL
Ketones, ur: NEGATIVE mg/dL
Nitrite: NEGATIVE
PROTEIN: NEGATIVE mg/dL
SPECIFIC GRAVITY, URINE: 1.018 (ref 1.005–1.030)
pH: 5 (ref 5.0–8.0)

## 2017-01-21 LAB — COMPREHENSIVE METABOLIC PANEL
ALT: 93 U/L — AB (ref 17–63)
AST: 77 U/L — AB (ref 15–41)
Albumin: 4 g/dL (ref 3.5–5.0)
Alkaline Phosphatase: 98 U/L (ref 38–126)
Anion gap: 10 (ref 5–15)
BILIRUBIN TOTAL: 0.5 mg/dL (ref 0.3–1.2)
BUN: 23 mg/dL — AB (ref 6–20)
CHLORIDE: 103 mmol/L (ref 101–111)
CO2: 24 mmol/L (ref 22–32)
CREATININE: 1.05 mg/dL (ref 0.61–1.24)
Calcium: 9.4 mg/dL (ref 8.9–10.3)
GFR calc Af Amer: >60 mL/min (ref >60)
Glucose, Bld: 114 mg/dL — ABNORMAL HIGH (ref 65–99)
Potassium: 3.6 mmol/L (ref 3.5–5.1)
Sodium: 137 mmol/L (ref 135–145)
Total Protein: 7.3 g/dL (ref 6.5–8.1)

## 2017-01-21 LAB — LIPASE, BLOOD: LIPASE: 26 U/L (ref 11–51)

## 2017-01-21 MED ORDER — KETOROLAC TROMETHAMINE 30 MG/ML IJ SOLN
30.0000 mg | Freq: Once | INTRAMUSCULAR | Status: AC
  Administered 2017-01-21: 30 mg via INTRAVENOUS
  Filled 2017-01-21: qty 1

## 2017-01-21 MED ORDER — TAMSULOSIN HCL 0.4 MG PO CAPS: 0.4000 mg | ORAL_CAPSULE | Freq: Every day | ORAL | 0 refills | Status: AC

## 2017-01-21 MED ORDER — CEFTRIAXONE SODIUM 1 G IJ SOLR
1.0000 g | Freq: Once | INTRAMUSCULAR | Status: AC
  Administered 2017-01-21: 1 g via INTRAVENOUS
  Filled 2017-01-21: qty 10

## 2017-01-21 MED ORDER — CEPHALEXIN 500 MG PO CAPS: 500.0000 mg | ORAL_CAPSULE | Freq: Four times a day (QID) | ORAL | 0 refills | Status: AC

## 2017-01-21 MED ORDER — OXYCODONE-ACETAMINOPHEN 5-325 MG PO TABS: 1.0000 | ORAL_TABLET | ORAL | 0 refills | Status: AC | PRN

## 2017-01-21 MED ORDER — HYDROMORPHONE HCL 1 MG/ML IJ SOLN
1.0000 mg | Freq: Once | INTRAMUSCULAR | Status: AC
  Administered 2017-01-21: 1 mg via INTRAVENOUS
  Filled 2017-01-21: qty 1

## 2017-01-21 MED ORDER — ONDANSETRON 4 MG PO TBDP: 4.0000 mg | ORAL_TABLET | Freq: Three times a day (TID) | ORAL | 0 refills | Status: AC | PRN

## 2017-01-21 MED ORDER — ONDANSETRON HCL 4 MG/2ML IJ SOLN
4.0000 mg | Freq: Once | INTRAMUSCULAR | Status: AC
  Administered 2017-01-21: 4 mg via INTRAVENOUS
  Filled 2017-01-21: qty 2

## 2017-01-21 MED ORDER — SODIUM CHLORIDE 0.9 % IV BOLUS (SEPSIS)
1000.0000 mL | Freq: Once | INTRAVENOUS | Status: AC
  Administered 2017-01-21: 1000 mL via INTRAVENOUS

## 2017-01-21 NOTE — ED Provider Notes (Signed)
[facility] Provider Note   CSN: [id number] Arrival date & time: 01/20/17  2320     History   Chief Complaint Chief Complaint  Patient presents with  . Flank Pain    left    HPI Dale Martinez is a 31 y.o. male.  Patient with history of recurrent flank pain and kidney stones presenting with left sided low back pain that wraps around to his groin for the past 4 hours.  He is not taking anything for it at home.  Said nausea but no vomiting.  Chills but no fever.  Has pain with urination but denies any blood in the urine.  Has required lithotripsy several years ago.  States he does not have much of an appetite today and is concerned that he might have a kidney stone again.  Endorses left-sided flank pain that radiates across lower abdomen to left testicle.   The history is provided by the patient.  Flank Pain  Pertinent negatives include no abdominal pain, no headaches and no shortness of breath.    Past Medical History:  Diagnosis Date  . Chronic flank pain   . Kidney stone   . Strep throat     There are no active problems to display for this patient.   Past Surgical History:  Procedure Laterality Date  . LITHOTRIPSY         Home Medications    Prior to Admission medications   Medication Sig Start Date End Date Taking? Authorizing Provider  ibuprofen (ADVIL,MOTRIN) 200 MG tablet Take 400 mg by mouth every 6 (six) hours as needed for headache or moderate pain.   Yes [provider]  ciprofloxacin (CIPRO) 500 MG tablet Take 1 tablet (500 mg total) by mouth every 12 (twelve) hours. 09/02/16   Rolland PorterJames, Mark, MD  HYDROcodone-acetaminophen (NORCO/VICODIN) 5-325 MG tablet Take 2 tablets by mouth every 4 (four) hours as needed. 09/02/16   Rolland PorterJames, Mark, MD  naproxen (NAPROSYN) 500 MG tablet Take 1 tablet (500 mg total) by mouth 2 (two) times daily. 05/12/16   Horton, Mayer Maskerourtney F, MD  ondansetron (ZOFRAN) 4 MG tablet Take 1 tablet (4 mg total) by  mouth every 6 (six) hours. 09/02/16   Rolland PorterJames, Mark, MD    Family History Family History  Problem Relation Age of Onset  . Diabetes Father     Social History Social History   Tobacco Use  . Smoking status: Current Every Day Smoker    Packs/day: 1.00    Types: Cigarettes  . Smokeless tobacco: Never Used  Substance Use Topics  . Alcohol use: No  . Drug use: No     Allergies   Patient has no known allergies.   Review of Systems Review of Systems  Constitutional: Positive for activity change and appetite change. Negative for fever.  Respiratory: Negative for cough, chest tightness and shortness of breath.   Gastrointestinal: Positive for nausea. Negative for abdominal pain and vomiting.  Genitourinary: Positive for dysuria and flank pain. Negative for hematuria and testicular pain.  Musculoskeletal: Positive for back pain. Negative for myalgias and neck pain.  Skin: Negative for rash.  Neurological: Negative for dizziness, weakness and headaches.   all other systems are negative except as noted in the HPI and PMH.     Physical Exam Updated Vital Signs BP (!) 155/94 (BP Location: Left Arm)   Pulse (!) 108   Temp 97.6 F (36.4 C) (Oral)   Resp 18   Ht 5\' 11"  (1.803  m)   Wt 113.4 kg (250 lb)   SpO2 94%   BMI 34.87 kg/m   Physical Exam  Constitutional: He is oriented to person, place, and time. He appears well-developed and well-nourished. No distress.  Appears comfortable  HENT:  Head: Normocephalic and atraumatic.  Mouth/Throat: Oropharynx is clear and moist. No oropharyngeal exudate.  Eyes: Conjunctivae and EOM are normal. Pupils are equal, round, and reactive to light.  Neck: Normal range of motion. Neck supple.  No meningismus.  Cardiovascular: Normal rate, regular rhythm, normal heart sounds and intact distal pulses.  No murmur heard. Pulmonary/Chest: Effort normal and breath sounds normal. No respiratory distress.  Abdominal: Soft. There is tenderness.  There is no rebound.  Mild L sided abdominal tenderness. No guarding or rebound  Genitourinary:  Genitourinary Comments: No testicular tenderness  Musculoskeletal: Normal range of motion. He exhibits no edema or tenderness.  Left CVAT  Neurological: He is alert and oriented to person, place, and time. No cranial nerve deficit. He exhibits normal muscle tone. Coordination normal.  No ataxia on finger to nose bilaterally. No pronator drift. 5/5 strength throughout. CN 2-12 intact.Equal grip strength. Sensation intact.   Skin: Skin is warm.  Psychiatric: He has a normal mood and affect. His behavior is normal.  Nursing note and vitals reviewed.    ED Treatments / Results  Labs (all labs ordered are listed, but only abnormal results are displayed) Labs Reviewed  COMPREHENSIVE METABOLIC PANEL - Abnormal; Notable for the following components:      Result Value   Glucose, Bld 114 (*)    BUN 23 (*)    AST 77 (*)    ALT 93 (*)    All other components within normal limits  CBC WITH DIFFERENTIAL/PLATELET - Abnormal; Notable for the following components:   WBC 15.5 (*)    Neutro Abs 9.8 (*)    Monocytes Absolute 1.3 (*)    All other components within normal limits  URINALYSIS, ROUTINE W REFLEX MICROSCOPIC - Abnormal; Notable for the following components:   APPearance HAZY (*)    Hgb urine dipstick LARGE (*)    Leukocytes, UA MODERATE (*)    Bacteria, UA RARE (*)    Squamous Epithelial / LPF 6-30 (*)    All other components within normal limits  URINE CULTURE  LIPASE, BLOOD    EKG  EKG Interpretation None       Radiology Ct Renal Stone Study  Result Date: 01/21/2017 CLINICAL DATA:  Left lower back pain for 1 day, radiating around to the groin. EXAM: CT ABDOMEN AND PELVIS WITHOUT CONTRAST TECHNIQUE: Multidetector CT imaging of the abdomen and pelvis was performed following the standard protocol without IV contrast. COMPARISON:  09/02/2016 FINDINGS: Lower chest: No acute  abnormality. Hepatobiliary: No focal liver abnormality is seen. No gallstones, gallbladder wall thickening, or biliary dilatation. Pancreas: Unremarkable. No pancreatic ductal dilatation or surrounding inflammatory changes. Spleen: Normal in size without focal abnormality. Adrenals/Urinary Tract: Both adrenals are normal. No significant renal parenchymal lesion. Numerous collecting system calculi are present bilaterally, measuring up to 5 mm. There is a distal left ureteral calculus measuring 2 x 3 mm, within 1 cm of the ureterovesical junction. No right ureteral calculi. Unremarkable urinary bladder. Mild left ureteral dilatation. Stomach/Bowel: Stomach is within normal limits. Appendix is normal. No evidence of bowel wall thickening, distention, or inflammatory changes. Vascular/Lymphatic: No significant vascular findings are present. No enlarged abdominal or pelvic lymph nodes. Reproductive: Unremarkable Other: No ascites.  Small fat containing  umbilical hernia. Musculoskeletal: No significant skeletal lesion. IMPRESSION: 1. 2 x 3 mm distal left ureteral calculus within 1 cm of the UVJ. 2. Bilateral nephrolithiasis. Electronically Signed   By: Ellery Plunk M.D.   On: 01/21/2017 01:13    Procedures Procedures (including critical care time)  Medications Ordered in ED Medications  sodium chloride 0.9 % bolus 1,000 mL (not administered)  ketorolac (TORADOL) 30 MG/ML injection 30 mg (not administered)  HYDROmorphone (DILAUDID) injection 1 mg (not administered)  ondansetron (ZOFRAN) injection 4 mg (not administered)     Initial Impression / Assessment and Plan / ED Course  I have reviewed the triage vital signs and the nursing notes.  Pertinent labs & imaging results that were available during my care of the patient were reviewed by me and considered in my medical decision making (see chart for details).    Flank pain similar to previous kidney stones with nausea.  No fever.  Patient appears  comfortable.  Leukocytosis noted and chronic.  Patient has had multiple CT scans in the past most [place] in [place] that showed 2 left-sided ureteral stones.  UA shows hematuria with leukocyte esterase and pyuria.  Discussed with patient that if he had an infected kidney stone, management would change and he is agreeable to imaging after risks and benefits discussed  CT does show left distal UVJ stone. Patient given IV Rocephin and urine culture sent.  Pages to urology Dr. Retta Diones not returned.  D/w Dr. Al Corpus on call for urology at [facility].  He agrees with antibiotics, Flomax and symptomatic control for kidney stone as patient has no hydronephrosis.  Patient to call urology office in the morning.  Return precautions discussed including worsening pain, vomiting or fever.  Final Clinical Impressions(s) / ED Diagnoses   Final diagnoses:  Kidney stone  Acute cystitis with hematuria    ED Discharge Orders        Ordered    cephALEXin (KEFLEX) 500 MG capsule  4 times daily     01/21/17 0254    oxyCODONE-acetaminophen (PERCOCET/ROXICET) 5-325 MG tablet  Every 4 hours PRN     01/21/17 0254    tamsulosin (FLOMAX) 0.4 MG CAPS capsule  Daily     01/21/17 0254    ondansetron (ZOFRAN ODT) 4 MG disintegrating tablet  Every 8 hours PRN     01/21/17 0254       Glynn Octave, MD 01/21/17 0310

## 2017-01-21 NOTE — Discharge Instructions (Signed)
Take the antibiotics and medications as prescribed.  Follow-up with urologist tomorrow.  Return to the ED if you develop worsening symptoms including fever, chills, uncontrolled vomiting or pain or any other concerns.

## 2017-01-22 LAB — URINE CULTURE

## 2017-07-17 IMAGING — CT CT RENAL STONE PROTOCOL
2 of 4 series · 16 of 46 positions shown, 18 images · non-contrast
Comparison: 04/19/2015

CLINICAL DATA: Flank pain that started 3-4 days ago. History of
stones.

EXAM:
CT ABDOMEN AND PELVIS WITHOUT CONTRAST
TECHNIQUE: Multidetector CT imaging of the abdomen and pelvis was performed
following the standard protocol without IV contrast.

[Series 2: standard/full over (age)lbs 5.0 · axial · 0.80mm/px · z∈[-518,-54]mm · 13 of 103 slices shown, 15 images]
[im 5/103  soft-tissue]
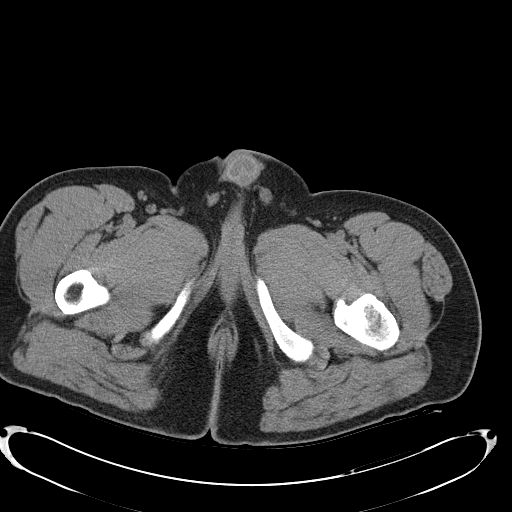
[im 5/103  bone]
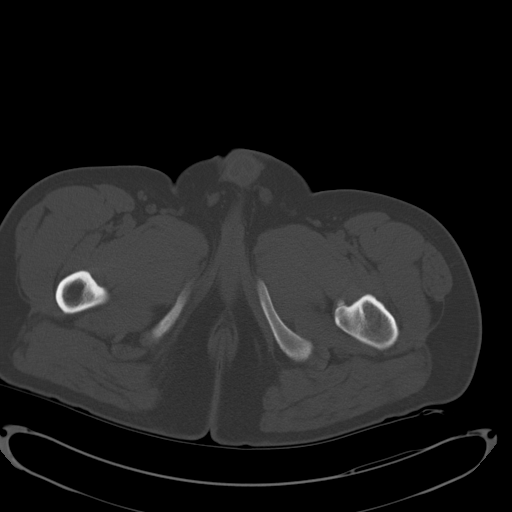
[im 14/103  soft-tissue]
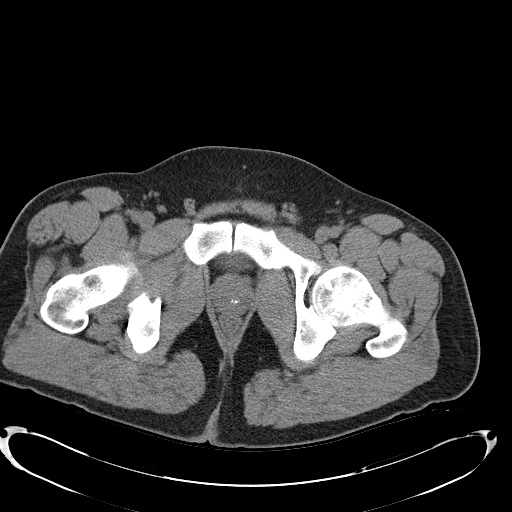
[im 23/103  soft-tissue]
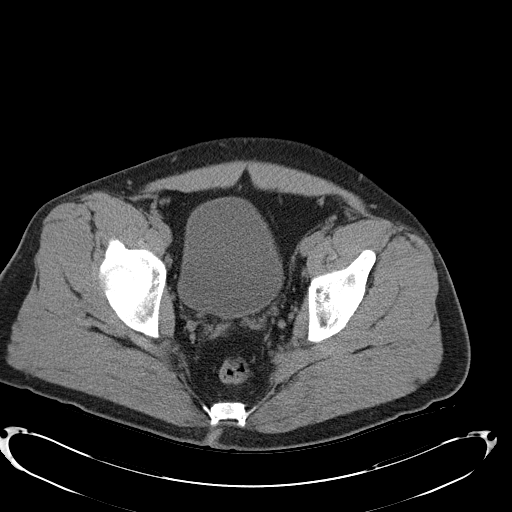
[im 27/103  soft-tissue]
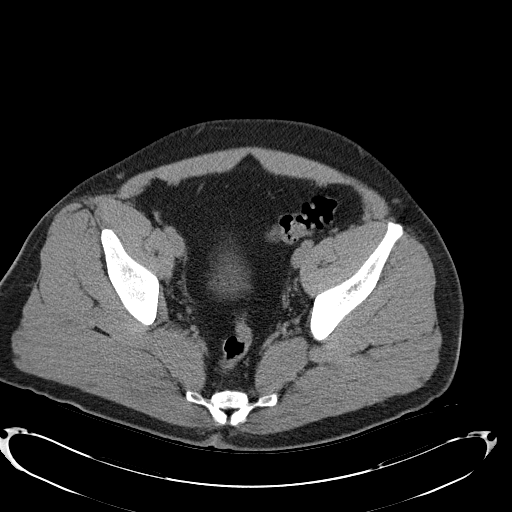
[im 36/103  soft-tissue]
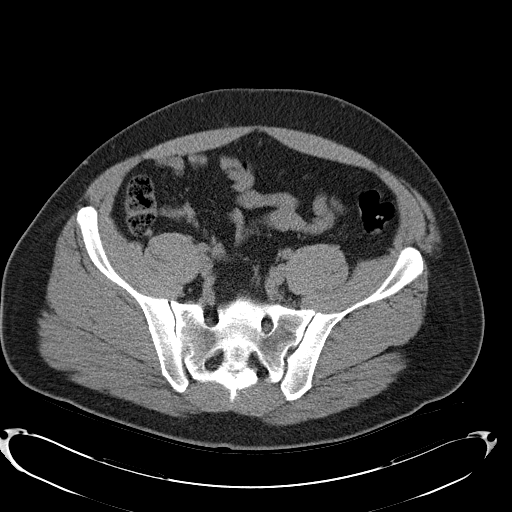
[im 45/103  soft-tissue]
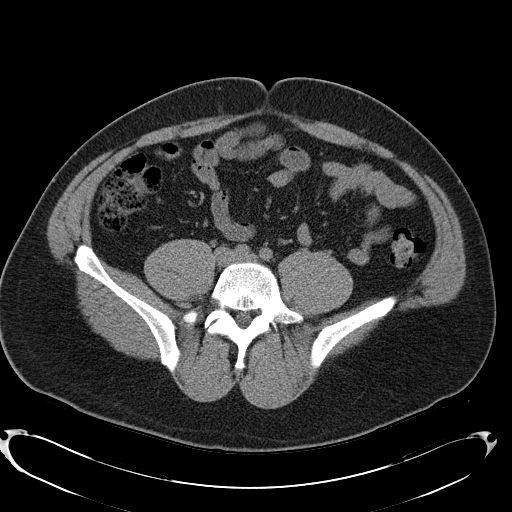
[im 54/103  soft-tissue]
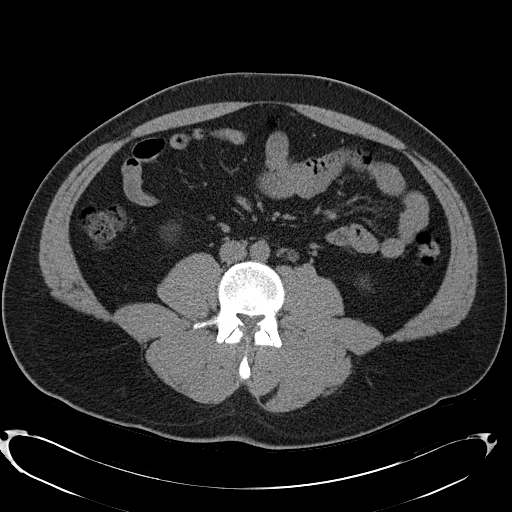
[im 58/103  soft-tissue]
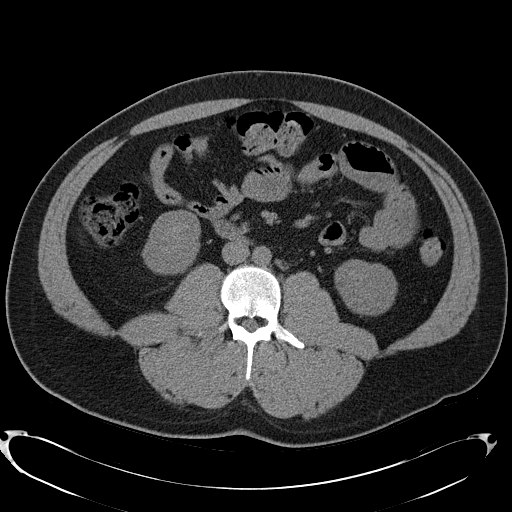
[im 67/103  soft-tissue]
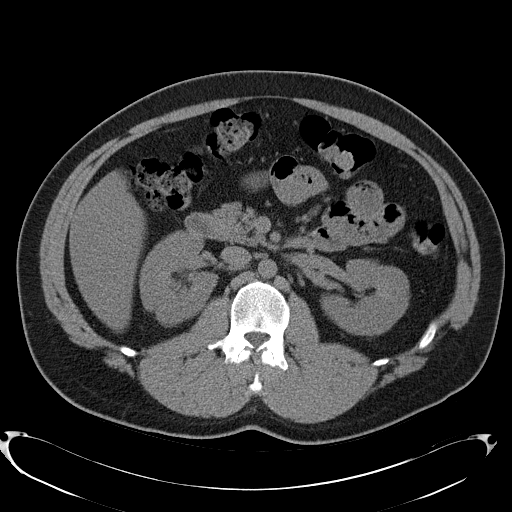
[im 67/103  bone]
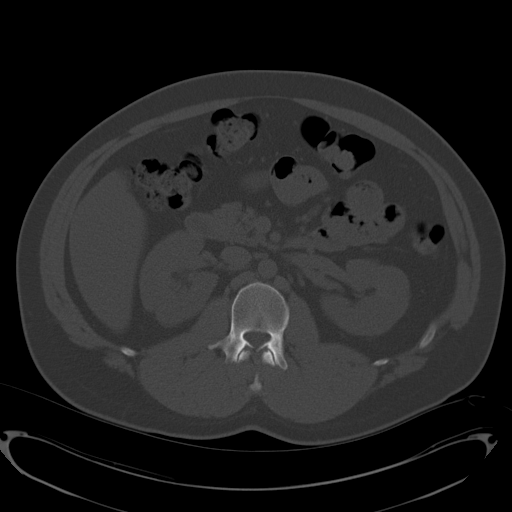
[im 76/103  soft-tissue]
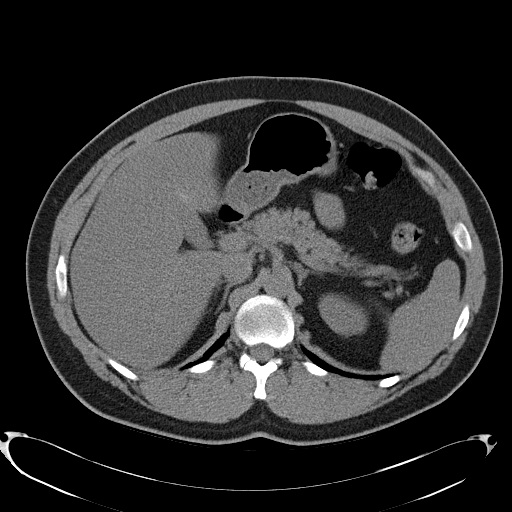
[im 80/103  soft-tissue]
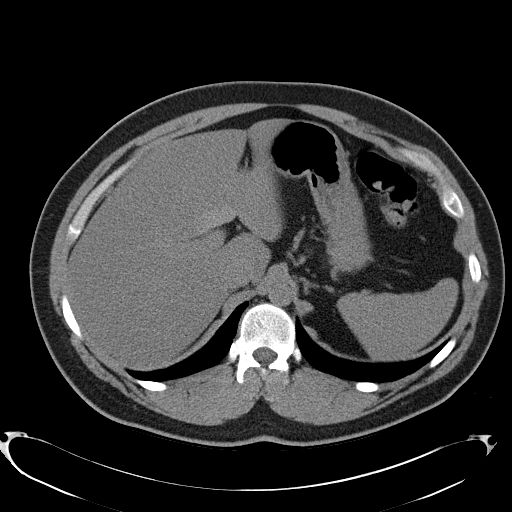
[im 89/103  soft-tissue]
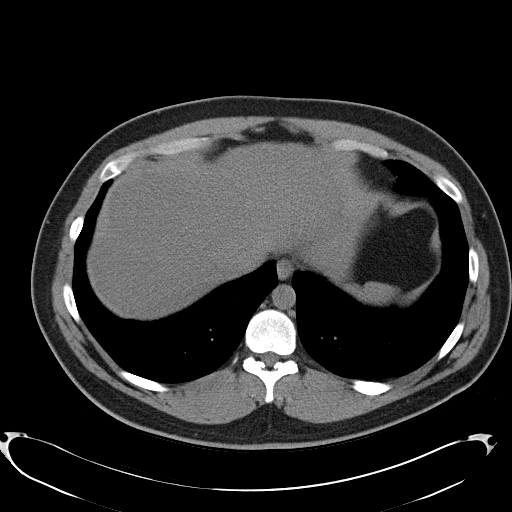
[im 98/103  soft-tissue]
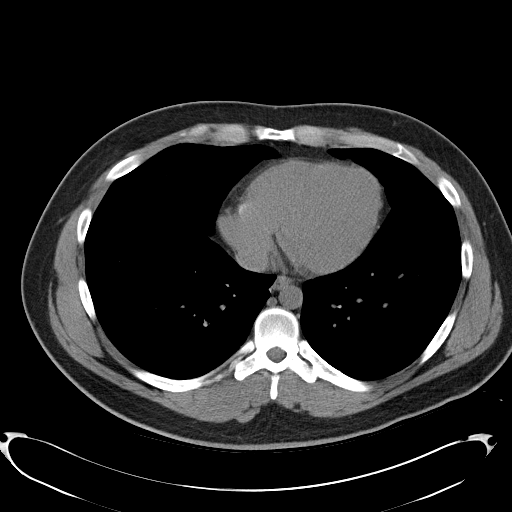

[Series 3: mpr coronal · coronal · 0.92mm/px · 3 of 111 slices shown]
[im 37/111  soft-tissue]
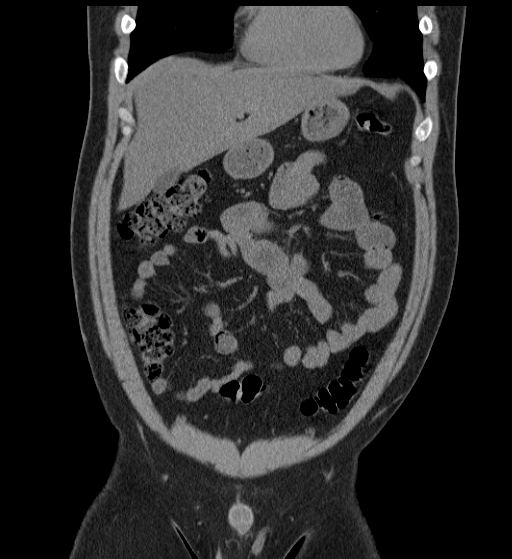
[im 49/111  soft-tissue]
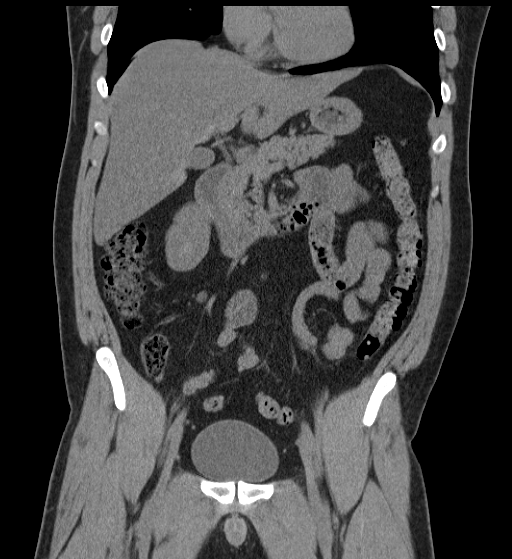
[im 62/111  soft-tissue]
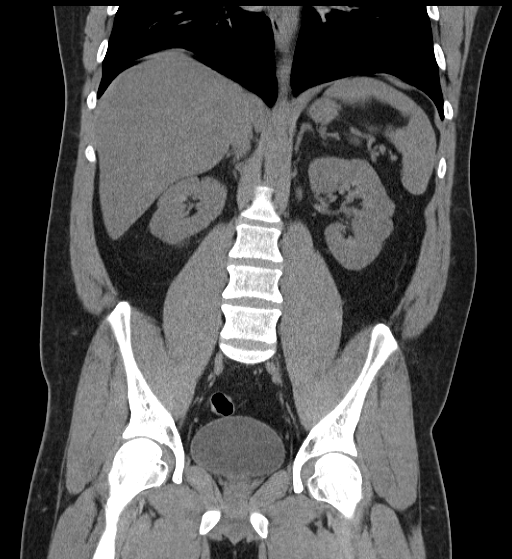

[16 of 46 positions shown; findings below may reference images not displayed]

FINDINGS: Lower chest:  Lung bases are clear.

Hepatobiliary: Low-density of the liver is suggestive for steatosis.
There is some fat sparing near the gallbladder. Again noted is a
dense nodular structure along the medial aspect of the gallbladder
wall measuring up to 1.2 cm on sequence 2, image 32. There has been
a similar structure in this area dating back to 02/07/2006. There is
no evidence for gallbladder distension.

Pancreas: Normal appearance of the pancreas without inflammation or
duct dilatation.

Spleen: Normal appearance of spleen without enlargement

Adrenals/Urinary Tract: Normal adrenal glands. 2-3 small stones in
left kidney, largest measuring 5 mm in lower pole. Negative for
hydronephrosis. Few punctate calcifications in the right kidney
without hydronephrosis. The right kidney punctate structures may be
in the medullary regions. Normal appearance the urinary bladder
without stones

Stomach/Bowel: Normal appearance of stomach, small bowel and colon.
Normal appendix.

Vascular/Lymphatic: No suspicious lymphadenopathy. Normal caliber of
the abdominal aorta.

Reproductive: Few calcifications in the prostate. Normal appearance
of seminal vesicles.

Other: No free fluid.  No free air.

Musculoskeletal: No acute abnormality.
IMPRESSION: Nephrolithiasis without hydronephrosis.  No ureter stones.

Chronic nodular structure along the medial gallbladder wall. This
has not significantly changed since 6220 and suggestive for a benign
etiology.

Hepatic steatosis.
